# Patient Record
Sex: Female | Born: 1995 | Race: Black or African American | Hispanic: No | Marital: Married | Smoking: Never smoker
Health system: Southern US, Community
[De-identification: ages and names within clinical notes are randomized; demographics above are authoritative.]

## PROBLEM LIST (undated history)

## (undated) ENCOUNTER — Inpatient Hospital Stay: Payer: Self-pay

## (undated) DIAGNOSIS — G43909 Migraine, unspecified, not intractable, without status migrainosus: Secondary | ICD-10-CM

## (undated) DIAGNOSIS — M1990 Unspecified osteoarthritis, unspecified site: Secondary | ICD-10-CM

## (undated) HISTORY — DX: Migraine, unspecified, not intractable, without status migrainosus: G43.909

## (undated) HISTORY — PX: WISDOM TOOTH EXTRACTION: SHX21

## (undated) HISTORY — DX: Unspecified osteoarthritis, unspecified site: M19.90

---
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
## 2017-06-16 ENCOUNTER — Other Ambulatory Visit: Payer: Self-pay

## 2017-06-16 ENCOUNTER — Encounter: Payer: Self-pay | Admitting: Emergency Medicine

## 2017-06-16 ENCOUNTER — Emergency Department
Admission: EM | Admit: 2017-06-16 | Discharge: 2017-06-16 | Disposition: A | Discharge status: Home / Self Care | Payer: Medicaid Other | Attending: Emergency Medicine | Admitting: Emergency Medicine

## 2017-06-16 DIAGNOSIS — K2100 Gastro-esophageal reflux disease with esophagitis, without bleeding: Secondary | ICD-10-CM

## 2017-06-16 DIAGNOSIS — J029 Acute pharyngitis, unspecified: Secondary | ICD-10-CM | POA: Diagnosis present

## 2017-06-16 MED ORDER — GI COCKTAIL ~~LOC~~
30.0000 mL | Freq: Once | ORAL | Status: AC
Start: 2017-06-16 — End: 2017-06-16
  Administered 2017-06-16: 30 mL via ORAL
  Filled 2017-06-16: qty 30

## 2017-06-16 MED ORDER — OMEPRAZOLE 20 MG PO CPDR: 20.0000 mg | DELAYED_RELEASE_CAPSULE | Freq: Every day | ORAL | 0 refills | Status: DC

## 2017-06-16 NOTE — ED Notes (Signed)
Pt ambulatory upon discharge. This RN gave graham crackers and water, per patient request. Pt stated "I feel like I'm going to pass out. I haven't eaten all day." Verbalized understanding of discharge instructions, prescription and follow up. VSS. Skin warm and dry. A&O x4.

## 2017-06-16 NOTE — ED Triage Notes (Signed)
Pain with swollowing food or liquids x 2 days.

## 2017-06-16 NOTE — Discharge Instructions (Signed)
Follow-up with your regular doctor if your not better in 3-5 days, take the medication as prescribed, try to avoid spicy foods, if you are becoming worse please return to the emergency department

## 2017-06-16 NOTE — ED Provider Notes (Signed)
[facility] Emergency Department Provider Note  ____________________________________________   First MD Initiated Contact with Patient 06/16/17 1534     (approximate)  I have reviewed the triage vital signs and the nursing notes.   HISTORY  Chief Complaint Sore Throat    HPI Felicia Jones is a 21 y.o. female complains of epigastric pain, states it is worse after eating spicy chicken, chips, or any kind of hot spicy foods, she states that she feels like she could throw up but that something stuck and she cannot, she has not had any vomiting, no aspirin use, she does not smoke or drink, she states she does not feel weak, denies fever, chills, cough or congestion  History reviewed. No pertinent past medical history.  There are no active problems to display for this patient.   Past Surgical History:  Procedure Laterality Date  . WISDOM TOOTH EXTRACTION      Prior to Admission medications   Medication Sig Start Date End Date Taking? Authorizing Provider  omeprazole (PRILOSEC) 20 MG capsule Take 1 capsule (20 mg total) by mouth daily. 06/16/17   Faythe GheeFisher, Anaeli Cornwall W, PA-C    Allergies Patient has no known allergies.  No family history on file.  Social History Social History   Tobacco Use  . Smoking status: Not on file  Substance Use Topics  . Alcohol use: Not on file  . Drug use: Not on file    Review of Systems  Constitutional: No fever/chills Eyes: No visual changes. ENT: Positive sore throat only with swallowing chips. Respiratory: Denies cough ABD: Complains of epigastric pain Genitourinary: Negative for dysuria. Musculoskeletal: Negative for back pain. Skin: Negative for rash.    ____________________________________________   PHYSICAL EXAM:  VITAL SIGNS: ED Triage Vitals  Enc Vitals Group     BP 06/16/17 1520 (!) 130/59     Pulse Rate 06/16/17 1520 75     Resp 06/16/17 1520 18     Temp 06/16/17 1520 98.3 F (36.8 C)   Temp Source 06/16/17 1520 Oral     SpO2 06/16/17 1520 99 %     Weight 06/16/17 1520 170 lb (77.1 kg)     Height 06/16/17 1520 5' (1.524 m)     Head Circumference --      Peak Flow --      Pain Score 06/16/17 1519 3     Pain Loc --      Pain Edu? --      Excl. in GC? --     Constitutional: Alert and oriented. Well appearing and in no acute distress.  Patient does not appear ill, she is nontoxic Eyes: Conjunctivae are normal.  Head: Atraumatic. Nose: No congestion/rhinnorhea. Mouth/Throat: Mucous membranes are moist.  Throat is normal Cardiovascular: Normal rate, regular rhythm.  Heart sounds are normal Respiratory: Normal respiratory effort.  No retractions, lungs are clear to auscultation ABD: Soft, tender in the midepigastric and right upper quadrant, bowel sounds are normal in all 4 quadrants GU: deferred Musculoskeletal: FROM all extremities, warm and well perfused Neurologic:  Normal speech and language.  Skin:  Skin is warm, dry and intact. No rash noted. Psychiatric: Mood and affect are normal. Speech and behavior are normal.  ____________________________________________   LABS (all labs ordered are listed, but only abnormal results are displayed)  Labs Reviewed - No data to display ____________________________________________   ____________________________________________  RADIOLOGY    ____________________________________________   PROCEDURES  Procedure(s) performed: GI cocktail      ____________________________________________  INITIAL IMPRESSION / ASSESSMENT AND PLAN / ED COURSE  Pertinent labs & imaging results that were available during my care of the patient were reviewed by me and considered in my medical decision making (see chart for details).  Patient is a 21 year old female complaining of epigastric pain, she appears well and healthy, symptoms started 2 days ago after she had eaten some spicy chicken, she has not had vomiting or diarrhea,  she has not tried any over-the-counter medications for heartburn or reflux, GI cocktail was ordered   ----------------------------------------- 7:07 PM on 06/16/2017 -----------------------------------------  Patient states that she had relief with the GI cocktail, she states she feels much better, she was instructed to not eat spicy foods, she was given a prescription for omeprazole 20 mg daily, she is to return if she is getting worse or see her regular doctor, patient states she understands treatment plan and will comply, she is discharged in stable condition  ____________________________________________   FINAL CLINICAL IMPRESSION(S) / ED DIAGNOSES  Final diagnoses:  Gastroesophageal reflux disease with esophagitis      NEW MEDICATIONS STARTED DURING THIS VISIT:  This SmartLink is deprecated. Use AVSMEDLIST instead to display the medication list for a patient.   Note:  This document was prepared using Dragon voice recognition software and may include unintentional dictation errors.    Faythe GheeFisher, Zema Lizardo W, PA-C 06/16/17 Nicholos [id number]    Faythe GheeFisher, Janee Ureste W, PA-C 06/16/17 Windell [id number]    Jeanmarie PlantMcShane, James A, MD 06/16/17 [phone]

## 2017-07-03 NOTE — L&D Delivery Note (Signed)
Delivery Note At 1:40 PM a viable female was delivered via Vaginal, Spontaneous (Presentation: ROA).  APGAR: 9, 9; weight pending.   Placenta status: delivered spontaneously, intact.  Cord:  3VC with the following complications: none.  Cord pH: n/a  Anesthesia:  epidural Episiotomy: None Lacerations: 1st degree;Vaginal Suture Repair: 3.0 vicryl Quantified Blood Loss (mL):  702 mL  Mom to `.  Baby to Couplet care / Skin to Skin.  Called to see patient.  Mom pushed to deliver a viable female infant.  The head followed by shoulders, which delivered without difficulty, and the rest of the body.  No nuchal cord noted.  Baby to mom's chest.  Cord clamped and cut after > 1 min delay.  No cord blood obtained.  Placenta delivered spontaneously, intact, with a 3-vessel cord.  First degree vaginal laceration repaired with 3-0 Vicryl in one location that was not hemostatic. Hemostatic after suture.  All counts correct.  Hemostasis obtained with IV pitocin and fundal massage. QBL 702 mL.  Due to postpartum hemorrhage (or greater than expected blood loss), patient given misoprostol 800 mcg per rectum and metherine IM 0.2 mg with hemostasis obtained.  Thomasene MohairStephen Meili Kleckley, MD 05/16/2018, 2:10 PM

## 2017-07-04 ENCOUNTER — Encounter: Payer: Self-pay | Admitting: Emergency Medicine

## 2017-07-04 ENCOUNTER — Emergency Department
Admission: EM | Admit: 2017-07-04 | Discharge: 2017-07-04 | Disposition: A | Discharge status: Home / Self Care | Payer: Medicaid Other | Attending: Emergency Medicine | Admitting: Emergency Medicine

## 2017-07-04 DIAGNOSIS — J219 Acute bronchiolitis, unspecified: Secondary | ICD-10-CM | POA: Diagnosis not present

## 2017-07-04 DIAGNOSIS — R05 Cough: Secondary | ICD-10-CM | POA: Diagnosis present

## 2017-07-04 MED ORDER — BENZONATATE 200 MG PO CAPS: 200.0000 mg | ORAL_CAPSULE | Freq: Three times a day (TID) | ORAL | 0 refills | Status: DC | PRN

## 2017-07-04 MED ORDER — AZITHROMYCIN 250 MG PO TABS: ORAL_TABLET | ORAL | 0 refills | Status: DC

## 2017-07-04 NOTE — Discharge Instructions (Signed)
Follow-up with your regular doctor if you are not better in 3-5 days, use medication as prescribed, take Tylenol or ibuprofen as needed for fever, return to the emergency department if your worsening

## 2017-07-04 NOTE — ED Triage Notes (Signed)
Pt comes into the Ed via POV c/o fever and cough for a couple of days.  Patient denies any shortness of breath with the cough.  Patient in NAD at this time with even and unlabored respirations.  Highest fever at home was 101.4 and it has been treat with OTC theraflu.

## 2017-07-04 NOTE — ED Provider Notes (Signed)
[facility] Emergency Department Provider Note  ____________________________________________   None    (approximate)  I have reviewed the triage vital signs and the nursing notes.   HISTORY  Chief Complaint Fever and Cough    HPI Felicia Jones is a 22 y.o. female complains of cough and congestion with fever, states her fever at home was 101, symptoms for over 48 hours, she states that she has body aches father fever is up but after that she does not, she used over-the-counter TheraFlu which did help, she denies vomiting or diarrhea, she denies sore throat or runny nose, she did miss work today due to the fever  History reviewed. No pertinent past medical history.  There are no active problems to display for this patient.   Past Surgical History:  Procedure Laterality Date  . WISDOM TOOTH EXTRACTION      Prior to Admission medications   Medication Sig Start Date End Date Taking? Authorizing Provider  azithromycin (ZITHROMAX Z-PAK) 250 MG tablet 2 pills today then 1 pill a day for 4 days 07/04/17   Sherrie MustacheFisher, Roselyn BeringSusan W, PA-C  benzonatate (TESSALON) 200 MG capsule Take 1 capsule (200 mg total) by mouth 3 (three) times daily as needed for cough. 07/04/17   Clayton Jarmon, Roselyn BeringSusan W, PA-C  omeprazole (PRILOSEC) 20 MG capsule Take 1 capsule (20 mg total) by mouth daily. 06/16/17   Faythe GheeFisher, Jaysa Kise W, PA-C    Allergies Patient has no known allergies.  No family history on file.  Social History Social History   Tobacco Use  . Smoking status: Never Smoker  . Smokeless tobacco: Never Used  Substance Use Topics  . Alcohol use: No    Frequency: Never  . Drug use: No    Review of Systems  Constitutional: Positive fever/chills Eyes: No visual changes. ENT: No sore throat. Respiratory: Positive cough Genitourinary: Negative for dysuria. Musculoskeletal: Negative for back pain. Skin: Negative for rash.    ____________________________________________   PHYSICAL  EXAM:  VITAL SIGNS: ED Triage Vitals [07/04/17 1800]  Enc Vitals Group     BP 138/79     Pulse Rate (!) 106     Resp 15     Temp 98.9 F (37.2 C)     Temp Source Oral     SpO2 99 %     Weight 170 lb (77.1 kg)     Height 5' (1.524 m)     Head Circumference      Peak Flow      Pain Score 0     Pain Loc      Pain Edu?      Excl. in GC?     Constitutional: Alert and oriented. Well appearing and in no acute distress. Eyes: Conjunctivae are normal.  Head: Atraumatic. Nose: No congestion/rhinnorhea. Mouth/Throat: Mucous membranes are moist.  Throat is normal Cardiovascular: Normal rate, regular rhythm.  Heart sounds are normal Respiratory: Normal respiratory effort.  No retractions, lungs clear to auscultation, cough is hacking and dry GU: deferred Musculoskeletal: FROM all extremities, warm and well perfused Neurologic:  Normal speech and language.  Skin:  Skin is warm, dry and intact. No rash noted. Psychiatric: Mood and affect are normal. Speech and behavior are normal.  ____________________________________________   LABS (all labs ordered are listed, but only abnormal results are displayed)  Labs Reviewed - No data to display ____________________________________________   ____________________________________________  RADIOLOGY    ____________________________________________   PROCEDURES  Procedure(s) performed: No  ____________________________________________   INITIAL IMPRESSION / ASSESSMENT AND PLAN / ED COURSE  Pertinent labs & imaging results that were available during my care of the patient were reviewed by me and considered in my medical decision making (see chart for details).  Patient is a 22 year old female complaining of cough and congestion, states she had a fever while at home, symptoms been going on for close to or greater than 48 hours, she denies chest pain or shortness of breath, physical exam is benign except for the dry hacking  cough, discussed with patient whether to do a flu swab or not, she agrees that since it has been closer to 48 hours and Tamiflu so expensive to not do this flu swab, patient was diagnosed with acute bronchitis, prescription for Z-Pak, Tessalon Perles, she is to continue her TheraFlu as needed, Tylenol and ibuprofen for fever as needed, she is given a work note she works at [employer], she is not to work until she is not had a fever in 24-48 hours, patient states she understands will comply with our recommendations, she was discharged in stable condition     As part of my medical decision making, I reviewed the following data within the electronic MEDICAL RECORD NUMBER   ____________________________________________   FINAL CLINICAL IMPRESSION(S) / ED DIAGNOSES  Final diagnoses:  Acute bronchiolitis due to unspecified organism      NEW MEDICATIONS STARTED DURING THIS VISIT:  This SmartLink is deprecated. Use AVSMEDLIST instead to display the medication list for a patient.   Note:  This document was prepared using Dragon voice recognition software and may include unintentional dictation errors.    Faythe Ghee, PA-C 07/04/17 Lavetta Nielsen, MD 07/10/17 [phone]

## 2017-10-16 ENCOUNTER — Ambulatory Visit (INDEPENDENT_AMBULATORY_CARE_PROVIDER_SITE_OTHER): Payer: Medicaid Other | Admitting: Maternal Newborn

## 2017-10-16 ENCOUNTER — Encounter: Payer: Self-pay | Admitting: Maternal Newborn

## 2017-10-16 ENCOUNTER — Ambulatory Visit (INDEPENDENT_AMBULATORY_CARE_PROVIDER_SITE_OTHER): Payer: Medicaid Other

## 2017-10-16 VITALS — BP 110/70 | Wt 179.0 lb

## 2017-10-16 DIAGNOSIS — Z6834 Body mass index (BMI) 34.0-34.9, adult: Secondary | ICD-10-CM

## 2017-10-16 DIAGNOSIS — Z3400 Encounter for supervision of normal first pregnancy, unspecified trimester: Secondary | ICD-10-CM

## 2017-10-16 DIAGNOSIS — Z3490 Encounter for supervision of normal pregnancy, unspecified, unspecified trimester: Secondary | ICD-10-CM | POA: Insufficient documentation

## 2017-10-16 NOTE — Progress Notes (Signed)
10/16/2017   Chief Complaint: Amenorrhea, positive home pregnancy test, desires prenatal care.  Transfer of Care Patient: no  History of Present Illness: Felicia Jones is a 21 y.o. G1P0 [redacted]w[redacted]d based on Patient's last menstrual period on 08/28/2017 (approximate), with an Estimated Date of Delivery: 06/04/18, with the above CC.   Her periods were: regular periods every 28 days, lasting 3-4 days She was using Ortho-Evra, stopped right before she conceived.  She has Positive signs or symptoms of nausea/vomiting of pregnancy. She has Negative signs or symptoms of miscarriage or preterm labor She identifies Negative Zika risk factors for her and her partner On any different medications around the time she conceived/early pregnancy: No History of varicella: No   ROS: A 12-point review of systems was performed and negative, except as stated in the above HPI.  OBGYN History: As per HPI. OB History  Gravida Para Term Preterm AB Living  1            SAB TAB Ectopic Multiple Live Births               # Outcome Date GA Lbr Len/2nd Weight Sex Delivery Anes PTL Lv  1 Current             Any issues with any prior pregnancies: not applicable Any prior children are healthy, doing well, without any problems or issues: not applicable History of pap smears: N/A, under 95 years old History of STIs: No   Past Medical History: Past Medical History:  Diagnosis Date  . Arthritis    in back  . Migraines     Past Surgical History: Past Surgical History:  Procedure Laterality Date  . WISDOM TOOTH EXTRACTION      Family History:  Family History  Problem Relation Age of Onset  . Migraines Mother   . Diabetes Maternal Grandmother   . Hypertension Maternal Grandmother   . Diabetes Maternal Grandfather   . Hypertension Maternal Grandfather    She denies any female cancers, bleeding or blood clotting disorders.  She denies any history of intellectual disability, birth defects or genetic disorders in  her or the FOB's history.  Social History:  Social History   Socioeconomic History  . Marital status: Single    Spouse name: Not on file  . Number of children: Not on file  . Years of education: 51  . Highest education level: Not on file  Occupational History  . Occupation: Agricultural engineer    Comment: [employer]  Social Needs  . Financial resource strain: Not on file  . Food insecurity:    Worry: Not on file    Inability: Not on file  . Transportation needs:    Medical: Not on file    Non-medical: Not on file  Tobacco Use  . Smoking status: Never Smoker  . Smokeless tobacco: Never Used  Substance and Sexual Activity  . Alcohol use: No    Frequency: Never  . Drug use: No  . Sexual activity: Yes    Birth control/protection: None  Lifestyle  . Physical activity:    Days per week: Not on file    Minutes per session: Not on file  . Stress: Not on file  Relationships  . Social connections:    Talks on phone: Not on file    Gets together: Not on file    Attends religious service: Not on file    Active member of club or organization: Not on file    Attends meetings  of clubs or organizations: Not on file    Relationship status: Not on file  . Intimate partner violence:    Fear of current or ex partner: Not on file    Emotionally abused: Not on file    Physically abused: Not on file    Forced sexual activity: Not on file  Other Topics Concern  . Not on file  Social History Narrative  . Not on file   Any cats in the household: no  Allergy: No Known Allergies  Current Outpatient Medications:  Current Outpatient Medications:  .  Prenatal Vit-Fe Fumarate-FA (PRENATAL VITAMIN) 27-0.8 MG TABS, Take 1 tablet by mouth daily., Disp: , Rfl:    Physical Exam:   BP 110/70   Wt 179 lb (81.2 kg)   LMP 08/28/2017 (Approximate)   BMI 34.96 kg/m  Body mass index is 34.96 kg/m. Constitutional: Well nourished, well developed female in no acute distress.  Neck:   Supple, normal appearance, and no thyromegaly  Cardiovascular: S1, S2 normal, no murmur, rub or gallop, regular rate and rhythm Respiratory:  Clear to auscultation bilaterally. Normal respiratory effort Abdomen: positive bowel sounds and no masses, hernias; diffusely non tender to palpation, non distended Breasts: breasts appear normal, no suspicious masses, no skin or nipple changes or axillary nodes, risk and benefit of breast self-exam was discussed. Neuro/Psych:  Normal mood and affect.  Skin:  Warm and dry.  Lymphatic:  No inguinal lymphadenopathy.   Pelvic exam: is not limited by body habitus External genitalia, Bartholin's glands, Urethra, Skene's glands: within normal limits Vagina: within normal limits and with no blood in the vault  Cervix: normal appearing cervix without discharge or lesions, closed/long/high Uterus:  enlarged, c/w early pregnancy Adnexa:  no mass, fullness, tenderness  Assessment: Felicia Jones is a 22 y.o. G1P0 1146w0d based on Patient's last menstrual period on 08/28/2017 (approximate), with an Estimated Date of Delivery: 06/04/18, presenting for prenatal care.  Plan:  1) Avoid alcoholic beverages. 2) Patient encouraged not to smoke.  3) Discontinue the use of all non-medicinal drugs and chemicals.  4) Take prenatal vitamins daily.  5) Seatbelt use advised 6) Nutrition, food safety (fish, cheese advisories, and high nitrite foods) and exercise discussed. 7) [facility] discussed  8) Patient is asked about travel to areas at risk for the Zika virus, and counseled to avoid travel and exposure to mosquitoes or sexual partners who may have themselves been exposed to the virus. Testing is discussed, and will be ordered as appropriate.  9) [facility] advised 10) Genetic Screening, such as with 1st Trimester Screening, cell free fetal DNA, AFP testing, and Ultrasound, as well as with amniocentesis and CVS as appropriate, is  discussed with patient. She is undecided about genetic testing this pregnancy, considering First Trimester screen. 11) GTT for BMI > 30 and NOB labs, will return next week as she has eaten this morning. 12) Ultrasound today shows a singleton IUP with FHR of 136 and size=dates. 13) Bonjesta sample given, will call for Diclegis Rx if effective.  Problem list reviewed and updated.  Felicia Jones, CNM [facility], [facility] 10/16/2017  8:42 AM

## 2017-10-16 NOTE — Patient Instructions (Signed)
First Trimester of Pregnancy The first trimester of pregnancy is from week 1 until the end of week 13 (months 1 through 3). A week after a sperm fertilizes an egg, the egg will implant on the wall of the uterus. This embryo will begin to develop into a baby. Genes from you and your partner will form the baby. The female genes will determine whether the baby will be a boy or a girl. At 6-8 weeks, the eyes and face will be formed, and the heartbeat can be seen on ultrasound. At the end of 12 weeks, all the baby's organs will be formed. Now that you are pregnant, you will want to do everything you can to have a healthy baby. Two of the most important things are to get good prenatal care and to follow your health care provider's instructions. Prenatal care is all the medical care you receive before the baby's birth. This care will help prevent, find, and treat any problems during the pregnancy and childbirth. Body changes during your first trimester Your body goes through many changes during pregnancy. The changes vary from woman to woman.  You may gain or lose a couple of pounds at first.  You may feel sick to your stomach (nauseous) and you may throw up (vomit). If the vomiting is uncontrollable, call your health care provider.  You may tire easily.  You may develop headaches that can be relieved by medicines. All medicines should be approved by your health care provider.  You may urinate more often. Painful urination may mean you have a bladder infection.  You may develop heartburn as a result of your pregnancy.  You may develop constipation because certain hormones are causing the muscles that push stool through your intestines to slow down.  You may develop hemorrhoids or swollen veins (varicose veins).  Your breasts may begin to grow larger and become tender. Your nipples may stick out more, and the tissue that surrounds them (areola) may become darker.  Your gums may bleed and may be  sensitive to brushing and flossing.  Dark spots or blotches (chloasma, mask of pregnancy) may develop on your face. This will likely fade after the baby is born.  Your menstrual periods will stop.  You may have a loss of appetite.  You may develop cravings for certain kinds of food.  You may have changes in your emotions from day to day, such as being excited to be pregnant or being concerned that something may go wrong with the pregnancy and baby.  You may have more vivid and strange dreams.  You may have changes in your hair. These can include thickening of your hair, rapid growth, and changes in texture. Some women also have hair loss during or after pregnancy, or hair that feels dry or thin. Your hair will most likely return to normal after your baby is born.  What to expect at prenatal visits During a routine prenatal visit:  You will be weighed to make sure you and the baby are growing normally.  Your blood pressure will be taken.  Your abdomen will be measured to track your baby's growth.  The fetal heartbeat will be listened to between weeks 10 and 14 of your pregnancy.  Test results from any previous visits will be discussed.  Your health care provider may ask you:  How you are feeling.  If you are feeling the baby move.  If you have had any abnormal symptoms, such as leaking fluid, bleeding, severe headaches,   or abdominal cramping.  If you are using any tobacco products, including cigarettes, chewing tobacco, and electronic cigarettes.  If you have any questions.  Other tests that may be performed during your first trimester include:  Blood tests to find your blood type and to check for the presence of any previous infections. The tests will also be used to check for low iron levels (anemia) and protein on red blood cells (Rh antibodies). Depending on your risk factors, or if you previously had diabetes during pregnancy, you may have tests to check for high blood  sugar that affects pregnant women (gestational diabetes).  Urine tests to check for infections, diabetes, or protein in the urine.  An ultrasound to confirm the proper growth and development of the baby.  Fetal screens for spinal cord problems (spina bifida) and Down syndrome.  HIV (human immunodeficiency virus) testing. Routine prenatal testing includes screening for HIV, unless you choose not to have this test.  You may need other tests to make sure you and the baby are doing well.  Follow these instructions at home: Medicines  Follow your health care provider's instructions regarding medicine use. Specific medicines may be either safe or unsafe to take during pregnancy.  Take a prenatal vitamin that contains at least 600 micrograms (mcg) of folic acid.  If you develop constipation, try taking a stool softener if your health care provider approves. Eating and drinking  Eat a balanced diet that includes fresh fruits and vegetables, whole grains, good sources of protein such as meat, eggs, or tofu, and low-fat dairy. Your health care provider will help you determine the amount of weight gain that is right for you.  Avoid raw meat and uncooked cheese. These carry germs that can cause birth defects in the baby.  Eating four or five small meals rather than three large meals a day may help relieve nausea and vomiting. If you start to feel nauseous, eating a few soda crackers can be helpful. Drinking liquids between meals, instead of during meals, also seems to help ease nausea and vomiting.  Limit foods that are high in fat and processed sugars, such as fried and sweet foods.  To prevent constipation: ? Eat foods that are high in fiber, such as fresh fruits and vegetables, whole grains, and beans. ? Drink enough fluid to keep your urine clear or pale yellow. Activity  Exercise only as directed by your health care provider. Most women can continue their usual exercise routine during  pregnancy. Try to exercise for 30 minutes at least 5 days a week. Exercising will help you: ? Control your weight. ? Stay in shape. ? Be prepared for labor and delivery.  Experiencing pain or cramping in the lower abdomen or lower back is a good sign that you should stop exercising. Check with your health care provider before continuing with normal exercises.  Try to avoid standing for long periods of time. Move your legs often if you must stand in one place for a long time.  Avoid heavy lifting.  Wear low-heeled shoes and practice good posture.  You may continue to have sex unless your health care provider tells you not to. Relieving pain and discomfort  Wear a good support bra to relieve breast tenderness.  Take warm sitz baths to soothe any pain or discomfort caused by hemorrhoids. Use hemorrhoid cream if your health care provider approves.  Rest with your legs elevated if you have leg cramps or low back pain.  If you develop   varicose veins in your legs, wear support hose. Elevate your feet for 15 minutes, 3-4 times a day. Limit salt in your diet. Prenatal care  Schedule your prenatal visits by the twelfth week of pregnancy. They are usually scheduled monthly at first, then more often in the last 2 months before delivery.  Write down your questions. Take them to your prenatal visits.  Keep all your prenatal visits as told by your health care provider. This is important. Safety  Wear your seat belt at all times when driving.  Make a list of emergency phone numbers, including numbers for family, friends, the hospital, and police and fire departments. General instructions  Ask your health care provider for a referral to a local prenatal education class. Begin classes no later than the beginning of month 6 of your pregnancy.  Ask for help if you have counseling or nutritional needs during pregnancy. Your health care provider can offer advice or refer you to specialists for help  with various needs.  Do not use hot tubs, steam rooms, or saunas.  Do not douche or use tampons or scented sanitary pads.  Do not cross your legs for long periods of time.  Avoid cat litter boxes and soil used by cats. These carry germs that can cause birth defects in the baby and possibly loss of the fetus by miscarriage or stillbirth.  Avoid all smoking, herbs, alcohol, and medicines not prescribed by your health care provider. Chemicals in these products affect the formation and growth of the baby.  Do not use any products that contain nicotine or tobacco, such as cigarettes and e-cigarettes. If you need help quitting, ask your health care provider. You may receive counseling support and other resources to help you quit.  Schedule a dentist appointment. At home, brush your teeth with a soft toothbrush and be gentle when you floss. Contact a health care provider if:  You have dizziness.  You have mild pelvic cramps, pelvic pressure, or nagging pain in the abdominal area.  You have persistent nausea, vomiting, or diarrhea.  You have a bad smelling vaginal discharge.  You have pain when you urinate.  You notice increased swelling in your face, hands, legs, or ankles.  You are exposed to fifth disease or chickenpox.  You are exposed to German measles (rubella) and have never had it. Get help right away if:  You have a fever.  You are leaking fluid from your vagina.  You have spotting or bleeding from your vagina.  You have severe abdominal cramping or pain.  You have rapid weight gain or loss.  You vomit blood or material that looks like coffee grounds.  You develop a severe headache.  You have shortness of breath.  You have any kind of trauma, such as from a fall or a car accident. Summary  The first trimester of pregnancy is from week 1 until the end of week 13 (months 1 through 3).  Your body goes through many changes during pregnancy. The changes vary from  woman to woman.  You will have routine prenatal visits. During those visits, your health care provider will examine you, discuss any test results you may have, and talk with you about how you are feeling. This information is not intended to replace advice given to you by your health care provider. Make sure you discuss any questions you have with your health care provider. Document Released: 06/13/2001 Document Revised: 05/31/2016 Document Reviewed: 05/31/2016 Elsevier Interactive Patient Education  2018 Elsevier   Inc[employer]  

## 2017-10-16 NOTE — Progress Notes (Signed)
No [email or web address].rj 

## 2017-10-18 LAB — URINE DRUG PANEL 7
Amphetamines, Urine: NEGATIVE ng/mL
BARBITURATE QUANT UR: NEGATIVE ng/mL
Benzodiazepine Quant, Ur: NEGATIVE ng/mL
Cannabinoid Quant, Ur: NEGATIVE ng/mL
Cocaine (Metab.): NEGATIVE ng/mL
OPIATE QUANT UR: NEGATIVE ng/mL
PCP Quant, Ur: NEGATIVE ng/mL

## 2017-10-18 LAB — GC/CHLAMYDIA PROBE AMP
CHLAMYDIA, DNA PROBE: NEGATIVE
NEISSERIA GONORRHOEAE BY PCR: NEGATIVE

## 2017-10-18 LAB — URINE CULTURE

## 2017-10-19 ENCOUNTER — Encounter: Payer: Self-pay | Admitting: Maternal Newborn

## 2017-10-23 ENCOUNTER — Other Ambulatory Visit: Payer: Medicaid Other

## 2017-10-23 DIAGNOSIS — Z3400 Encounter for supervision of normal first pregnancy, unspecified trimester: Secondary | ICD-10-CM

## 2017-10-23 DIAGNOSIS — Z6834 Body mass index (BMI) 34.0-34.9, adult: Secondary | ICD-10-CM

## 2017-10-23 LAB — OB RESULTS CONSOLE VARICELLA ZOSTER ANTIBODY, IGG: VARICELLA IGG: IMMUNE

## 2017-10-23 LAB — OB RESULTS CONSOLE RUBELLA ANTIBODY, IGM: RUBELLA: IMMUNE

## 2017-10-25 LAB — RPR+RH+ABO+RUB AB+AB SCR+CB...
ANTIBODY SCREEN: NEGATIVE
HEMATOCRIT: 37.1 % (ref 34.0–46.6)
HEMOGLOBIN: 11.9 g/dL (ref 11.1–15.9)
HIV SCREEN 4TH GENERATION: NONREACTIVE
Hepatitis B Surface Ag: NEGATIVE
MCH: 26.6 pg (ref 26.6–33.0)
MCHC: 32.1 g/dL (ref 31.5–35.7)
MCV: 83 fL (ref 79–97)
Platelets: 315 10*3/uL (ref 150–379)
RBC: 4.48 x10E6/uL (ref 3.77–5.28)
RDW: 15.3 % (ref 12.3–15.4)
RH TYPE: POSITIVE
RPR Ser Ql: NONREACTIVE
Rubella Antibodies, IGG: 2.4 index (ref >0.99)
Varicella zoster IgG: 355 index (ref >165)
WBC: 9.3 10*3/uL (ref 3.4–10.8)

## 2017-10-25 LAB — HEMOGLOBINOPATHY EVALUATION
HEMOGLOBIN A2 QUANTITATION: 2.3 % (ref 1.8–3.2)
HGB A: 97.7 % (ref 96.4–98.8)
HGB C: 0 %
HGB S: 0 %
HGB VARIANT: 0 %
Hemoglobin F Quantitation: 0 % (ref 0.0–2.0)

## 2017-10-25 LAB — GLUCOSE, 1 HOUR GESTATIONAL: Gestational Diabetes Screen: 119 mg/dL (ref 65–139)

## 2017-10-26 ENCOUNTER — Other Ambulatory Visit: Payer: Self-pay | Admitting: Maternal Newborn

## 2017-10-26 DIAGNOSIS — Z3400 Encounter for supervision of normal first pregnancy, unspecified trimester: Secondary | ICD-10-CM

## 2017-11-13 ENCOUNTER — Ambulatory Visit (INDEPENDENT_AMBULATORY_CARE_PROVIDER_SITE_OTHER): Payer: Medicaid Other | Admitting: Obstetrics and Gynecology

## 2017-11-13 ENCOUNTER — Encounter: Payer: Self-pay | Admitting: Obstetrics and Gynecology

## 2017-11-13 VITALS — BP 100/60 | Wt 178.0 lb

## 2017-11-13 DIAGNOSIS — Z3A11 11 weeks gestation of pregnancy: Secondary | ICD-10-CM

## 2017-11-13 DIAGNOSIS — Z1379 Encounter for other screening for genetic and chromosomal anomalies: Secondary | ICD-10-CM

## 2017-11-13 DIAGNOSIS — O219 Vomiting of pregnancy, unspecified: Secondary | ICD-10-CM

## 2017-11-13 DIAGNOSIS — Z3400 Encounter for supervision of normal first pregnancy, unspecified trimester: Secondary | ICD-10-CM

## 2017-11-13 MED ORDER — PROVIDA OB 20-20-1.25 MG PO CAPS: 1.0000 | ORAL_CAPSULE | Freq: Every day | ORAL | 12 refills | Status: DC

## 2017-11-13 MED ORDER — DOXYLAMINE-PYRIDOXINE 10-10 MG PO TBEC: 2.0000 | DELAYED_RELEASE_TABLET | Freq: Every day | ORAL | 5 refills | Status: DC

## 2017-11-13 NOTE — Patient Instructions (Signed)
Second Trimester of Pregnancy The second trimester is from week 13 through week 28, month 4 through 6. This is often the time in pregnancy that you feel your best. Often times, morning sickness has lessened or quit. You may have more energy, and you may get hungry more often. Your unborn baby (fetus) is growing rapidly. At the end of the sixth month, he or she is about 9 inches long and weighs about 1 pounds. You will likely feel the baby move (quickening) between 18 and 20 weeks of pregnancy. Follow these instructions at home:  Avoid all smoking, herbs, and alcohol. Avoid drugs not approved by your doctor.  Do not use any tobacco products, including cigarettes, chewing tobacco, and electronic cigarettes. If you need help quitting, ask your doctor. You may get counseling or other support to help you quit.  Only take medicine as told by your doctor. Some medicines are safe and some are not during pregnancy.  Exercise only as told by your doctor. Stop exercising if you start having cramps.  Eat regular, healthy meals.  Wear a good support bra if your breasts are tender.  Do not use hot tubs, steam rooms, or saunas.  Wear your seat belt when driving.  Avoid raw meat, uncooked cheese, and liter boxes and soil used by cats.  Take your prenatal vitamins.  Take 1500-2000 milligrams of calcium daily starting at the 20th week of pregnancy until you deliver your baby.  Try taking medicine that helps you poop (stool softener) as needed, and if your doctor approves. Eat more fiber by eating fresh fruit, vegetables, and whole grains. Drink enough fluids to keep your pee (urine) clear or pale yellow.  Take warm water baths (sitz baths) to soothe pain or discomfort caused by hemorrhoids. Use hemorrhoid cream if your doctor approves.  If you have puffy, bulging veins (varicose veins), wear support hose. Raise (elevate) your feet for 15 minutes, 3-4 times a day. Limit salt in your diet.  Avoid heavy  lifting, wear low heals, and sit up straight.  Rest with your legs raised if you have leg cramps or low back pain.  Visit your dentist if you have not gone during your pregnancy. Use a soft toothbrush to brush your teeth. Be gentle when you floss.  You can have sex (intercourse) unless your doctor tells you not to.  Go to your doctor visits. Get help if:  You feel dizzy.  You have mild cramps or pressure in your lower belly (abdomen).  You have a nagging pain in your belly area.  You continue to feel sick to your stomach (nauseous), throw up (vomit), or have watery poop (diarrhea).  You have bad smelling fluid coming from your vagina.  You have pain with peeing (urination). Get help right away if:  You have a fever.  You are leaking fluid from your vagina.  You have spotting or bleeding from your vagina.  You have severe belly cramping or pain.  You lose or gain weight rapidly.  You have trouble catching your breath and have chest pain.  You notice sudden or extreme puffiness (swelling) of your face, hands, ankles, feet, or legs.  You have not felt the baby move in over an hour.  You have severe headaches that do not go away with medicine.  You have vision changes. This information is not intended to replace advice given to you by your health care provider. Make sure you discuss any questions you have with your health care   provider. Document Released: 09/13/2009 Document Revised: 11/25/2015 Document Reviewed: 08/20/2012 Elsevier Interactive Patient Education  2017 Elsevier Inc.  

## 2017-11-13 NOTE — Progress Notes (Signed)
    Routine Prenatal Care Visit  Subjective  Felicia Jones is a 22 y.o. G1P0 at [redacted]w[redacted]d being seen today for ongoing prenatal care.  She is currently monitored for the following issues for this low-risk pregnancy and has Bilateral low back pain without sciatica and Supervision of normal first pregnancy, antepartum on their problem list.  ----------------------------------------------------------------------------------- Patient reports no complaints.    . Vag. Bleeding: None.   . Denies leaking of fluid.  ----------------------------------------------------------------------------------- The following portions of the patient's history were reviewed and updated as appropriate: allergies, current medications, past family history, past medical history, past social history, past surgical history and problem list. Problem list updated.   Objective  Blood pressure 100/60, weight 178 lb (80.7 kg), last menstrual period 08/28/2017. Pregravid weight 175 lb (79.4 kg) Total Weight Gain 3 lb (1.361 kg) Urinalysis: Urine Protein: Negative Urine Glucose: Negative  Fetal Status: Fetal Heart Rate (bpm): 160         General:  Alert, oriented and cooperative. Patient is in no acute distress.  Skin: Skin is warm and dry. No rash noted.   Cardiovascular: Normal heart rate noted  Respiratory: Normal respiratory effort, no problems with respiration noted  Abdomen: Soft, gravid, appropriate for gestational age. Pain/Pressure: Absent     Pelvic:  Cervical exam deferred        Extremities: Normal range of motion.     ental Status: Normal mood and affect. Normal behavior. Normal judgment and thought content.     Assessment   22 y.o. G1P0 at [redacted]w[redacted]d by  06/04/2018, by Last Menstrual Period presenting for work-in prenatal visit  Plan   FIRST Problems (from 10/16/17 to present)    Problem Noted Resolved   Supervision of normal first pregnancy, antepartum 10/16/2017 by Oswaldo Conroy, CNM No   Overview  Addendum 10/26/2017  1:21 PM by Oswaldo Conroy, CNM    [facility]inic [facility] Prenatal Labs  Dating L=6 Blood type: B/Positive/-- (04/23 1023)   Genetic Screen 1 Screen:    AFP:     Quad:     NIPS: Antibody:Negative (04/23 1023)  Anatomic [place]  Rubella: 2.40 (04/23 1023) Varicella: Immune  GTT Early: 119              Third trimester:  RPR: Non Reactive (04/23 1023)   Rhogam  HBsAg: Negative (04/23 1023)   TDaP vaccine                       Flu Shot: HIV: Non Reactive (04/23 1023)   Baby Food                                GBS:   Contraception  Pap: N/A, less than 54 years old  CBB     CS/VBAC    Support Person                  Gestational age appropriate obstetric precautions including but not limited to vaginal bleeding, contractions, leaking of fluid and fetal movement were reviewed in detail with the patient.    Prescription sent for [facility] Prenatal vitamins Prescription sent for Diclegis Materniti 21 testing today, patient desires an envelope with the gender.  Return in about 5 weeks (around 12/18/2017) for ROB.  Adelene Idler MD [facility], [facility] 11/13/17 9:45 AM

## 2017-11-18 LAB — MATERNIT 21 PLUS CORE, BLOOD
Chromosome 13: NEGATIVE
Chromosome 18: NEGATIVE
Chromosome 21: NEGATIVE
Y Chromosome: DETECTED

## 2017-11-19 ENCOUNTER — Telehealth: Payer: Self-pay | Admitting: Obstetrics and Gynecology

## 2017-11-19 NOTE — Telephone Encounter (Signed)
Patient is calling due to missed call in regards to results. Please advise

## 2017-11-19 NOTE — Telephone Encounter (Signed)
I have not seen the patient looks like she was seen by [facility] and [employer]

## 2017-11-19 NOTE — Telephone Encounter (Signed)
Please call and let patient know she can pick up envelope with gender as requested. Left message that genetic screening was normal for the pregnancy.  Thank you, Dr. Jerene Pitch

## 2017-11-19 NOTE — Telephone Encounter (Signed)
Forward to Dr. Jerene Pitch to advise. Sending error to AMS>

## 2017-11-19 NOTE — Telephone Encounter (Signed)
Patient is requesting a copy to pick up. Please advise

## 2017-11-19 NOTE — Progress Notes (Signed)
Please print gender result and put in envelope for the patient. Also please call her again so she can pick up the result. I called and left a message that it was ready. Thank you, Dr. Jerene Pitch

## 2017-12-03 ENCOUNTER — Encounter: Payer: Self-pay | Admitting: Emergency Medicine

## 2017-12-03 ENCOUNTER — Other Ambulatory Visit: Payer: Self-pay

## 2017-12-03 ENCOUNTER — Emergency Department
Admission: EM | Admit: 2017-12-03 | Discharge: 2017-12-03 | Disposition: A | Discharge status: Home / Self Care | Payer: Medicaid Other | Attending: Emergency Medicine | Admitting: Emergency Medicine

## 2017-12-03 DIAGNOSIS — O9989 Other specified diseases and conditions complicating pregnancy, childbirth and the puerperium: Secondary | ICD-10-CM | POA: Insufficient documentation

## 2017-12-03 DIAGNOSIS — Z3A13 13 weeks gestation of pregnancy: Secondary | ICD-10-CM | POA: Diagnosis not present

## 2017-12-03 DIAGNOSIS — R51 Headache: Secondary | ICD-10-CM | POA: Diagnosis not present

## 2017-12-03 DIAGNOSIS — G8929 Other chronic pain: Secondary | ICD-10-CM

## 2017-12-03 DIAGNOSIS — M545 Low back pain, unspecified: Secondary | ICD-10-CM

## 2017-12-03 DIAGNOSIS — Z79899 Other long term (current) drug therapy: Secondary | ICD-10-CM | POA: Diagnosis not present

## 2017-12-03 LAB — URINALYSIS, ROUTINE W REFLEX MICROSCOPIC
Bilirubin Urine: NEGATIVE
GLUCOSE, UA: NEGATIVE mg/dL
Hgb urine dipstick: NEGATIVE
KETONES UR: NEGATIVE mg/dL
LEUKOCYTES UA: NEGATIVE
NITRITE: NEGATIVE
PROTEIN: NEGATIVE mg/dL
Specific Gravity, Urine: 1.015 (ref 1.005–1.030)
pH: 5 (ref 5.0–8.0)

## 2017-12-03 NOTE — ED Triage Notes (Addendum)
Patient reports having headache since Sunday and back pain. Denies urinary symptoms.  Patient reports being [redacted] weeks pregnant.  Denies vaginal bleeding.

## 2017-12-03 NOTE — ED Notes (Signed)
ED Provider at bedside. 

## 2017-12-03 NOTE — Discharge Instructions (Addendum)
You can try the over-the-counter Motrin 3 pills for 600 mg total 3 times a day with food for 3 or 4 days. I would not use it longer than that. I would not use it later in pregnancy - when he gets to 20 - 24 weeks thereabouts. Can also use Tylenol as you have been. Please follow-up with your OB/GYN doctor return for any further problems.

## 2017-12-03 NOTE — ED Provider Notes (Signed)
[facility] Emergency Department Provider Note   ____________________________________________   First MD Initiated Contact with Patient 12/03/17 0915     (approximate)  I have reviewed the triage vital signs and the nursing notes.   HISTORY  Chief Complaint Back Pain    HPI Felicia Jones is a 22 y.o. female Patient with a history of back pain which had improved but has now returned she's been taking Tylenol at home but it still achy in her low back and is not radiating down her legs. She's also developed a headache the same as her previous migraines. Atenolol help with that either. She was unable to take her  migraine medicine due to her pregnancy. I discussed with her using Motrin during the early parts for pregnancy.she seems to be very happy with that   Past Medical History:  Diagnosis Date  . Arthritis    in back  . Migraines     Patient Active Problem List   Diagnosis Date Noted  . Supervision of normal first pregnancy, antepartum 10/16/2017  . Bilateral low back pain without sciatica 01/28/2016    Past Surgical History:  Procedure Laterality Date  . WISDOM TOOTH EXTRACTION      Prior to Admission medications   Medication Sig Start Date End Date Taking? Authorizing Provider  Doxylamine-Pyridoxine (DICLEGIS) 10-10 MG TBEC Take 2 tablets by mouth at bedtime. If symptoms persist, add one tablet in the morning and one in the afternoon 11/13/17   Schuman, Christanna R, MD  Prenat w/o A Vit-FeFum-FePo-FA (PROVIDA OB) 20-20-1.25 MG CAPS Take 1 tablet by mouth daily. 11/13/17 12/13/17  Schuman, Jaquelyn Bitter, MD  Prenatal Vit-Fe Fumarate-FA (PRENATAL VITAMIN) 27-0.8 MG TABS Take 1 tablet by mouth daily. 10/11/17   [provider]    Allergies Patient has no known allergies.  Family History  Problem Relation Age of Onset  . Migraines Mother   . Diabetes Maternal Grandmother   . Hypertension Maternal Grandmother   . Diabetes Maternal  Grandfather   . Hypertension Maternal Grandfather     Social History Social History   Tobacco Use  . Smoking status: Never Smoker  . Smokeless tobacco: Never Used  Substance Use Topics  . Alcohol use: No    Frequency: Never  . Drug use: No    Review of Systems  Constitutional: No fever/chills Eyes: No visual changes. ENT: No sore throat. Cardiovascular: Denies chest pain. Respiratory: Denies shortness of breath. Gastrointestinal: No abdominal pain.  No nausea, no vomiting.  No diarrhea.  No constipation. Genitourinary: Negative for dysuria. Musculoskeletal: Negative for back pain. Skin: Negative for rash. Neurological: Negative for headaches, focal weakness  ____________________________________________   PHYSICAL EXAM:  VITAL SIGNS: ED Triage Vitals  Enc Vitals Group     BP 12/03/17 0652 106/60     Pulse Rate 12/03/17 0652 85     Resp 12/03/17 0652 18     Temp 12/03/17 0652 98.3 F (36.8 C)     Temp Source 12/03/17 0652 Oral     SpO2 12/03/17 0652 99 %     Weight --      Height 12/03/17 0650 5' (1.524 m)     Head Circumference --      Peak Flow --      Pain Score 12/03/17 0650 6     Pain Loc --      Pain Edu? --      Excl. in GC? --     Constitutional: Alert and oriented. Well  appearing and in no acute distress. Eyes: Conjunctivae are normal. PER. EOMI. Head: Atraumatic. Nose: No congestion/rhinnorhea. Mouth/Throat: Mucous membranes are moist.  Oropharynx non-erythematous. Neck: No stridor.  Cardiovascular: Normal rate, regular rhythm. Grossly normal heart sounds.  Good peripheral circulation. Respiratory: Normal respiratory effort.  No retractions. Lungs CTAB. Gastrointestinal: Soft and nontender. No distention. No abdominal bruits. No CVA tenderness. Musculoskeletal: No lower extremity tenderness nor edema.  patient does have some pain on palpation of the low back. Neurologic:  Normal speech and language. No gross focal neurologic deficits are  appreciated.cranial nerves II through XII are intact motor strength is 5 over 5 throughout t straight leg raise is negative bilaterally Skin:  Skin is warm, dry and intact. No rash noted. Psychiatric: Mood and affect are normal. Speech and behavior are normal.  ____________________________________________   LABS (all labs ordered are listed, but only abnormal results are displayed)  Labs Reviewed  URINALYSIS, ROUTINE W REFLEX MICROSCOPIC - Abnormal; Notable for the following components:      Result Value   Color, Urine YELLOW (*)    APPearance CLEAR (*)    All other components within normal limits  POC URINE PREG, ED   ____________________________________________  EKG   ____________________________________________  RADIOLOGY  ED MD interpretation:    Official radiology report(s): No results found.  ____________________________________________   PROCEDURES  Procedure(s) performed:  Procedures  Critical Care performed:   ____________________________________________   INITIAL IMPRESSION / ASSESSMENT AND PLAN / ED COURSE  patient looks well symptoms are similar to what she had previous to her pregnancy. I will let her go and try add some Motrin to her regime. She will return if she is worse.         ____________________________________________   FINAL CLINICAL IMPRESSION(S) / ED DIAGNOSES  Final diagnoses:  Chronic low back pain without sciatica, unspecified back pain laterality     ED Discharge Orders    None       Note:  This document was prepared using Dragon voice recognition software and may include unintentional dictation errors.    Arnaldo NatalMalinda, Nolen Lindamood F, MD 12/03/17 [phone]

## 2017-12-18 ENCOUNTER — Ambulatory Visit (INDEPENDENT_AMBULATORY_CARE_PROVIDER_SITE_OTHER): Payer: Medicaid Other | Admitting: Obstetrics and Gynecology

## 2017-12-18 VITALS — Wt 176.0 lb

## 2017-12-18 DIAGNOSIS — Z3A16 16 weeks gestation of pregnancy: Secondary | ICD-10-CM

## 2017-12-18 DIAGNOSIS — Z3402 Encounter for supervision of normal first pregnancy, second trimester: Secondary | ICD-10-CM

## 2017-12-18 DIAGNOSIS — Z363 Encounter for antenatal screening for malformations: Secondary | ICD-10-CM

## 2017-12-18 DIAGNOSIS — Z31438 Encounter for other genetic testing of female for procreative management: Secondary | ICD-10-CM

## 2017-12-18 DIAGNOSIS — Z3400 Encounter for supervision of normal first pregnancy, unspecified trimester: Secondary | ICD-10-CM

## 2017-12-18 NOTE — Progress Notes (Signed)
ROB Middle back pain

## 2017-12-18 NOTE — Progress Notes (Signed)
    Routine Prenatal Care Visit  Subjective  Felicia Jones is a 22 y.o. G1P0 at 7829w0d being seen today for ongoing prenatal care.  She is currently monitored for the following issues for this low-risk pregnancy and has Bilateral low back pain without sciatica and Supervision of normal first pregnancy, antepartum on their problem list.  ----------------------------------------------------------------------------------- Patient reports no complaints.   Contractions: Not present. Vag. Bleeding: None.  Movement: Absent. Denies leaking of fluid.  ----------------------------------------------------------------------------------- The following portions of the patient's history were reviewed and updated as appropriate: allergies, current medications, past family history, past medical history, past social history, past surgical history and problem list. Problem list updated.   Objective  Weight 176 lb (79.8 kg), last menstrual period 08/28/2017. Pregravid weight 175 lb (79.4 kg) Total Weight Gain 1 lb (0.454 kg) Urinalysis: Urine Protein: Negative Urine Glucose: Negative  Fetal Status: Fetal Heart Rate (bpm): 150   Movement: Absent     General:  Alert, oriented and cooperative. Patient is in no acute distress.  Skin: Skin is warm and dry. No rash noted.   Cardiovascular: Normal heart rate noted  Respiratory: Normal respiratory effort, no problems with respiration noted  Abdomen: Soft, gravid, appropriate for gestational age. Pain/Pressure: Absent     Pelvic:  Cervical exam deferred        Extremities: Normal range of motion.     ental Status: Normal mood and affect. Normal behavior. Normal judgment and thought content.     Assessment   22 y.o. G1P0 at 6029w0d by  06/04/2018, by Last Menstrual Period presenting for routine prenatal visit  Plan   FIRST Problems (from 10/16/17 to present)    Problem Noted Resolved   Supervision of normal first pregnancy, antepartum 10/16/2017 by Oswaldo ConroySchmid,  Jacelyn Y, CNM No   Overview Addendum 12/18/2017  9:23 AM by Vena AustriaStaebler, Camilah Spillman, MD    Clinic [facility] Prenatal Labs  Dating L=6 Blood type: B/Positive/-- (04/23 1023)   Genetic Screen  AFP:  NIPS: Normal Female Antibody:Negative (04/23 1023)  Anatomic US  Rubella: 2.40 (04/23 1023) Varicella: Immune  GTT Early: 119              Third trimester:  RPR: Non Reactive (04/23 1023)   Rhogam  HBsAg: Negative (04/23 1023)   TDaP vaccine                       Flu Shot: HIV: Non Reactive (04/23 1023)   Baby Food                                GBS:   Contraception  Pap: N/A, less than 10658 years old  CBB     CS/VBAC    Support Person                  Gestational age appropriate obstetric precautions including but not limited to vaginal bleeding, contractions, leaking of fluid and fetal movement were reviewed in detail with the patient.   - Declines AFP - Accepts Inheritest - anatomy scan next visit  Return in about 1 month (around 01/15/2018) for ROB and anatomy scan.  Vena AustriaAndreas Nyilah Kight, MD, Merlinda FrederickFACOG [facility], [facility] 12/18/2017, 9:39 AM

## 2017-12-27 LAB — INHERITEST CORE(CF97,SMA,FRAX)

## 2017-12-28 ENCOUNTER — Encounter (INDEPENDENT_AMBULATORY_CARE_PROVIDER_SITE_OTHER): Payer: Self-pay

## 2018-01-15 ENCOUNTER — Ambulatory Visit (INDEPENDENT_AMBULATORY_CARE_PROVIDER_SITE_OTHER): Payer: Medicaid Other | Admitting: Obstetrics and Gynecology

## 2018-01-15 ENCOUNTER — Ambulatory Visit (INDEPENDENT_AMBULATORY_CARE_PROVIDER_SITE_OTHER): Payer: Medicaid Other

## 2018-01-15 VITALS — BP 110/60 | Wt 180.0 lb

## 2018-01-15 DIAGNOSIS — IMO0002 Reserved for concepts with insufficient information to code with codable children: Secondary | ICD-10-CM

## 2018-01-15 DIAGNOSIS — Z3400 Encounter for supervision of normal first pregnancy, unspecified trimester: Secondary | ICD-10-CM

## 2018-01-15 DIAGNOSIS — Z363 Encounter for antenatal screening for malformations: Secondary | ICD-10-CM | POA: Diagnosis not present

## 2018-01-15 DIAGNOSIS — Z3402 Encounter for supervision of normal first pregnancy, second trimester: Secondary | ICD-10-CM

## 2018-01-15 DIAGNOSIS — Z0489 Encounter for examination and observation for other specified reasons: Secondary | ICD-10-CM

## 2018-01-15 DIAGNOSIS — Z3A20 20 weeks gestation of pregnancy: Secondary | ICD-10-CM

## 2018-01-15 NOTE — Progress Notes (Signed)
ROB  Anatomy scan/ It is a BOY!! 

## 2018-01-15 NOTE — Progress Notes (Signed)
Routine Prenatal Care Visit  Subjective  Felicia Jones is a 22 y.o. G1P0 at 4831w0d being seen today for ongoing prenatal care.  She is currently monitored for the following issues for this low-risk pregnancy and has Bilateral low back pain without sciatica and Supervision of normal first pregnancy, antepartum on their problem list.  ----------------------------------------------------------------------------------- Patient reports no complaints.   Contractions: Not present. Vag. Bleeding: None.  Movement: Present. Denies leaking of fluid.  ----------------------------------------------------------------------------------- The following portions of the patient's history were reviewed and updated as appropriate: allergies, current medications, past family history, past medical history, past social history, past surgical history and problem list. Problem list updated.   Objective  Blood pressure 110/60, weight 180 lb (81.6 kg), last menstrual period 08/28/2017. Pregravid weight 175 lb (79.4 kg) Total Weight Gain 5 lb (2.268 kg)  Body mass index is 35.15 kg/m.  Urinalysis: Urine Protein: Negative Urine Glucose: Negative  Fetal Status: Fetal Heart Rate (bpm): 150   Movement: Present     General:  Alert, oriented and cooperative. Patient is in no acute distress.  Skin: Skin is warm and dry. No rash noted.   Cardiovascular: Normal heart rate noted  Respiratory: Normal respiratory effort, no problems with respiration noted  Abdomen: Soft, gravid, appropriate for gestational age. Pain/Pressure: Absent     Pelvic:  Cervical exam deferred        Extremities: Normal range of motion.     ental Status: Normal mood and affect. Normal behavior. Normal judgment and thought content.   [place] Ob Comp + 14 Wk  Result Date: 01/15/2018 Patient Name: Felicia Jones DOB: 03/24/1996 MRN: [id number] ULTRASOUND REPORT Location: [facility] Date of Service: 01/15/2018 Indications:Anatomy Ultrasound Findings:  Mason JimSingleton intrauterine pregnancy is visualized with FHR at 158 BPM. Biometrics give an (U/S) Gestational age of 915w4d and an (U/S) EDD of 05/31/2018; this correlates with the clinically established Estimated Date of Delivery: 06/04/18 Fetal presentation is Cephalic. EFW: 388 grams (14 oz). Placenta: Anterior, Grade 0. AFI: subjectively normal. Anatomic survey is incomplete for cardiac views, face, diaphragm and profile due to fetal position and normal; Gender - female.  --> TV cervical length is performed due to fluid is seen in transabdominal exam and appears normal. Right Ovary is normal in appearance. Left Ovary is normal appearance. Survey of the adnexa demonstrates no adnexal masses. There is no free peritoneal fluid in the cul de sac. Impression: 1. 7131w0d Viable Singleton Intrauterine pregnancy by U/S. 2. (U/S) EDD is consistent with Clinically established Estimated Date of Delivery: 06/04/18 . 3. Normal Anatomy Scan Recommendations: 1.Clinical correlation with the patient's History and Physical Exam. 2. Follow up anatomy Mital bahen P Patel  There is a singleton gestation with subjectively normal amniotic fluid volume. The fetal biometry correlates with established dating. Detailed evaluation of the fetal anatomy was performed.The fetal anatomical survey appears within normal limits within the resolution of ultrasound as described above, incomplete cardiac, face, and diaphragm views.  It must be noted that a normal ultrasound is unable to rule out fetal aneuploidy.  Vena AustriaAndreas Larrie Fraizer, MD, Evern CoreFACOG [facility], [facility] 01/15/2018, 10:01 AM     Assessment   22 y.o. G1P0 at 4331w0d by  06/04/2018, by Last Menstrual Period presenting for routine prenatal visit  Plan   FIRST Problems (from 10/16/17 to present)    Problem Noted Resolved   Supervision of normal first pregnancy, antepartum 10/16/2017 by Oswaldo ConroySchmid, Jacelyn Y, CNM No   Overview Addendum 01/15/2018 10:02 AM by Bonney AidStaebler,  Carmel Sacramento, MD     Clinic [facility] Prenatal Labs  Dating L=6 Blood type: B/Positive/-- (04/23 1023)   Genetic Screen Inheritest: negative NIPS: Normal Female Antibody:Negative (04/23 1023)  Anatomic [place] Female, [ ]  incomplete, complete 24 weeks Rubella: 2.40 (04/23 1023) Varicella: Immune  GTT Early: 119              Third trimester:  RPR: Non Reactive (04/23 1023)   Rhogam  HBsAg: Negative (04/23 1023)   TDaP vaccine                       Flu Shot: HIV: Non Reactive (04/23 1023)   Baby Food                                GBS:   Contraception  Pap: N/A, less than 6 years old  CBB     CS/VBAC    Support Person                  Gestational age appropriate obstetric precautions including but not limited to vaginal bleeding, contractions, leaking of fluid and fetal movement were reviewed in detail with the patient.    Return in about 1 month (around 02/12/2018) for ROB and follow up anatomy scan.  Vena Austria, MD, Evern Core [facility], [facility] 01/15/2018, 10:14 AM

## 2018-02-12 ENCOUNTER — Encounter: Payer: Self-pay | Admitting: Maternal Newborn

## 2018-02-12 ENCOUNTER — Ambulatory Visit (INDEPENDENT_AMBULATORY_CARE_PROVIDER_SITE_OTHER): Payer: Medicaid Other | Admitting: Maternal Newborn

## 2018-02-12 ENCOUNTER — Ambulatory Visit (INDEPENDENT_AMBULATORY_CARE_PROVIDER_SITE_OTHER): Payer: Medicaid Other

## 2018-02-12 VITALS — BP 120/60 | Wt 185.0 lb

## 2018-02-12 DIAGNOSIS — Z3A24 24 weeks gestation of pregnancy: Secondary | ICD-10-CM

## 2018-02-12 DIAGNOSIS — Z3400 Encounter for supervision of normal first pregnancy, unspecified trimester: Secondary | ICD-10-CM

## 2018-02-12 DIAGNOSIS — Z0489 Encounter for examination and observation for other specified reasons: Secondary | ICD-10-CM

## 2018-02-12 DIAGNOSIS — IMO0002 Reserved for concepts with insufficient information to code with codable children: Secondary | ICD-10-CM

## 2018-02-12 DIAGNOSIS — Z3402 Encounter for supervision of normal first pregnancy, second trimester: Secondary | ICD-10-CM | POA: Diagnosis not present

## 2018-02-12 DIAGNOSIS — Z3401 Encounter for supervision of normal first pregnancy, first trimester: Secondary | ICD-10-CM

## 2018-02-12 LAB — POCT URINALYSIS DIPSTICK OB
GLUCOSE, UA: NEGATIVE — AB
POC,PROTEIN,UA: NEGATIVE

## 2018-02-12 NOTE — Progress Notes (Signed)
    Routine Prenatal Care Visit  Subjective  Felicia Jones is a 22 y.o. G1P0 at 4090w0d being seen today for ongoing prenatal care.  She is currently monitored for the following issues for this low-risk pregnancy and has Bilateral low back pain without sciatica and Supervision of normal first pregnancy, antepartum on their problem list.  ----------------------------------------------------------------------------------- Patient reports no complaints.   Contractions: Not present. Vag. Bleeding: None.  Movement: Present. No leaking of fluid.  ----------------------------------------------------------------------------------- The following portions of the patient's history were reviewed and updated as appropriate: allergies, current medications, past family history, past medical history, past social history, past surgical history and problem list. Problem list updated.  Objective  Blood pressure 120/60, weight 185 lb (83.9 kg), last menstrual period 08/28/2017. Pregravid weight 175 lb (79.4 kg) Total Weight Gain 10 lb (4.536 kg) Body mass index is 36.13 kg/m. Urinalysis: Protein Negative, Glucose Negative Fetal Status: Fetal Heart Rate (bpm): 160 Fundal Height: 24 cm Movement: Present     General:  Alert, oriented and cooperative. Patient is in no acute distress.  Skin: Skin is warm and dry. No rash noted.   Cardiovascular: Normal heart rate noted  Respiratory: Normal respiratory effort, no problems with respiration noted  Abdomen: Soft, gravid, appropriate for gestational age. Pain/Pressure: Absent     Pelvic:  Cervical exam deferred        Extremities: Normal range of motion.  Edema: None  Mental Status: Normal mood and affect. Normal behavior. Normal judgment and thought content.    Assessment   22 y.o. G1P0 at 190w0d, EDD 06/04/2018 by Last Menstrual Period presenting for routine prenatal visit.  Plan   FIRST Problems (from 10/16/17 to present)    Problem Noted Resolved   Supervision  of normal first pregnancy, antepartum 10/16/2017 by Oswaldo ConroySchmid, Bailei Buist Y, CNM No   Overview Addendum 01/15/2018 10:15 AM by Vena AustriaStaebler, Andreas, MD    Clinic [facility] Prenatal Labs  Dating L=6 Blood type: B/Positive/-- (04/23 1023)   Genetic Screen Inheritest: negative NIPS: Normal Female Antibody:Negative (04/23 1023)  Anatomic US Female, [ ]  incomplete, complete 24 weeks Rubella: 2.40 (04/23 1023) Varicella: Immune  GTT Early: 119              Third trimester:  RPR: Non Reactive (04/23 1023)   Rhogam N/A HBsAg: Negative (04/23 1023)   TDaP vaccine                       Flu Shot: HIV: Non Reactive (04/23 1023)   Baby Food                                GBS:   Contraception  Pap: N/A, less than 20 years old  CBB     Support Person               Anatomy scan complete and normal today.  Discussed labor and delivery, breastfeeding, doula program. Science writerBrochure given for [employer] and childbirth class information given. She plans to take a tour of Labor and Delivery.  Gestational age appropriate obstetric precautions iwere reviewed.  Return in about 4 weeks (around 03/12/2018) for ROB with GTT and 28 week labs.  Marcelyn BruinsJacelyn Franny Selvage, CNM 02/12/2018  1:47 PM

## 2018-02-12 NOTE — Progress Notes (Signed)
No [email or web address].rj 

## 2018-03-12 ENCOUNTER — Ambulatory Visit (INDEPENDENT_AMBULATORY_CARE_PROVIDER_SITE_OTHER): Payer: Medicaid Other | Admitting: Obstetrics & Gynecology

## 2018-03-12 ENCOUNTER — Other Ambulatory Visit: Payer: Medicaid Other

## 2018-03-12 VITALS — BP 120/80 | Wt 194.0 lb

## 2018-03-12 DIAGNOSIS — Z3400 Encounter for supervision of normal first pregnancy, unspecified trimester: Secondary | ICD-10-CM

## 2018-03-12 DIAGNOSIS — Z3A28 28 weeks gestation of pregnancy: Secondary | ICD-10-CM

## 2018-03-12 NOTE — Progress Notes (Signed)
  Subjective  Fetal Movement? yes Contractions? no Leaking Fluid? no Vaginal Bleeding? no  Objective  BP 120/80   Wt 194 lb (88 kg)   LMP 08/28/2017 (Approximate)   BMI 37.89 kg/m  General: NAD Pumonary: no increased work of breathing Abdomen: gravid, non-tender Extremities: no edema Psychiatric: mood appropriate, affect full  Assessment  22 y.o. G1P0 at [redacted]w[redacted]d by  06/04/2018, by Last Menstrual Period presenting for routine prenatal visit  Plan   Problem List Items Addressed This Visit      Other   Supervision of normal first pregnancy, antepartum    Other Visit Diagnoses    [redacted] weeks gestation of pregnancy    -  Primary    Breast feeding Plans patch, counseled will have to wait 6 mos before able to prescribe patch if breast feeding    Declines IUD, Depo, Nexplanon.  Will consider prog-only pill. Declines flu shot.  TDaP nv.  Labs today  Annamarie Major, MD, [facility], [facility] 03/12/2018  9:43 AM

## 2018-03-12 NOTE — Patient Instructions (Signed)
Third Trimester of Pregnancy The third trimester is from week 28 through week 40 (months 7 through 9). The third trimester is a time when the unborn baby (fetus) is growing rapidly. At the end of the ninth month, the fetus is about 20 inches in length and weighs 6-10 pounds. Body changes during your third trimester Your body will continue to go through many changes during pregnancy. The changes vary from woman to woman. During the third trimester:  Your weight will continue to increase. You can expect to gain 25-35 pounds (11-16 kg) by the end of the pregnancy.  You may begin to get stretch marks on your hips, abdomen, and breasts.  You may urinate more often because the fetus is moving lower into your pelvis and pressing on your bladder.  You may develop or continue to have heartburn. This is caused by increased hormones that slow down muscles in the digestive tract.  You may develop or continue to have constipation because increased hormones slow digestion and cause the muscles that push waste through your intestines to relax.  You may develop hemorrhoids. These are swollen veins (varicose veins) in the rectum that can itch or be painful.  You may develop swollen, bulging veins (varicose veins) in your legs.  You may have increased body aches in the pelvis, back, or thighs. This is due to weight gain and increased hormones that are relaxing your joints.  You may have changes in your hair. These can include thickening of your hair, rapid growth, and changes in texture. Some women also have hair loss during or after pregnancy, or hair that feels dry or thin. Your hair will most likely return to normal after your baby is born.  Your breasts will continue to grow and they will continue to become tender. A yellow fluid (colostrum) may leak from your breasts. This is the first milk you are producing for your baby.  Your belly button may stick out.  You may notice more swelling in your hands,  face, or ankles.  You may have increased tingling or numbness in your hands, arms, and legs. The skin on your belly may also feel numb.  You may feel short of breath because of your expanding uterus.  You may have more problems sleeping. This can be caused by the size of your belly, increased need to urinate, and an increase in your body's metabolism.  You may notice the fetus "dropping," or moving lower in your abdomen (lightening).  You may have increased vaginal discharge.  You may notice your joints feel loose and you may have pain around your pelvic bone.  What to expect at prenatal visits You will have prenatal exams every 2 weeks until week 36. Then you will have weekly prenatal exams. During a routine prenatal visit:  You will be weighed to make sure you and the baby are growing normally.  Your blood pressure will be taken.  Your abdomen will be measured to track your baby's growth.  The fetal heartbeat will be listened to.  Any test results from the previous visit will be discussed.  You may have a cervical check near your due date to see if your cervix has softened or thinned (effaced).  You will be tested for Group B streptococcus. This happens between 35 and 37 weeks.  Your health care provider may ask you:  What your birth plan is.  How you are feeling.  If you are feeling the baby move.  If you have had   any abnormal symptoms, such as leaking fluid, bleeding, severe headaches, or abdominal cramping.  If you are using any tobacco products, including cigarettes, chewing tobacco, and electronic cigarettes.  If you have any questions.  Other tests or screenings that may be performed during your third trimester include:  Blood tests that check for low iron levels (anemia).  Fetal testing to check the health, activity level, and growth of the fetus. Testing is done if you have certain medical conditions or if there are problems during the  pregnancy.  Nonstress test (NST). This test checks the health of your baby to make sure there are no signs of problems, such as the baby not getting enough oxygen. During this test, a belt is placed around your belly. The baby is made to move, and its heart rate is monitored during movement.  What is false labor? False labor is a condition in which you feel small, irregular tightenings of the muscles in the womb (contractions) that usually go away with rest, changing position, or drinking water. These are called Braxton Hicks contractions. Contractions may last for hours, days, or even weeks before true labor sets in. If contractions come at regular intervals, become more frequent, increase in intensity, or become painful, you should see your health care provider. What are the signs of labor?  Abdominal cramps.  Regular contractions that start at 10 minutes apart and become stronger and more frequent with time.  Contractions that start on the top of the uterus and spread down to the lower abdomen and back.  Increased pelvic pressure and dull back pain.  A watery or bloody mucus discharge that comes from the vagina.  Leaking of amniotic fluid. This is also known as your "water breaking." It could be a slow trickle or a gush. Let your health care provider know if it has a color or strange odor. If you have any of these signs, call your health care provider right away, even if it is before your due date. Follow these instructions at home: Medicines  Follow your health care provider's instructions regarding medicine use. Specific medicines may be either safe or unsafe to take during pregnancy.  Take a prenatal vitamin that contains at least 600 micrograms (mcg) of folic acid.  If you develop constipation, try taking a stool softener if your health care provider approves. Eating and drinking  Eat a balanced diet that includes fresh fruits and vegetables, whole grains, good sources of protein  such as meat, eggs, or tofu, and low-fat dairy. Your health care provider will help you determine the amount of weight gain that is right for you.  Avoid raw meat and uncooked cheese. These carry germs that can cause birth defects in the baby.  If you have low calcium intake from food, talk to your health care provider about whether you should take a daily calcium supplement.  Eat four or five small meals rather than three large meals a day.  Limit foods that are high in fat and processed sugars, such as fried and sweet foods.  To prevent constipation: ? Drink enough fluid to keep your urine clear or pale yellow. ? Eat foods that are high in fiber, such as fresh fruits and vegetables, whole grains, and beans. Activity  Exercise only as directed by your health care provider. Most women can continue their usual exercise routine during pregnancy. Try to exercise for 30 minutes at least 5 days a week. Stop exercising if you experience uterine contractions.  Avoid heavy   lifting.  Do not exercise in extreme heat or humidity, or at high altitudes.  Wear low-heel, comfortable shoes.  Practice good posture.  You may continue to have sex unless your health care provider tells you otherwise. Relieving pain and discomfort  Take frequent breaks and rest with your legs elevated if you have leg cramps or low back pain.  Take warm sitz baths to soothe any pain or discomfort caused by hemorrhoids. Use hemorrhoid cream if your health care provider approves.  Wear a good support bra to prevent discomfort from breast tenderness.  If you develop varicose veins: ? Wear support pantyhose or compression stockings as told by your healthcare provider. ? Elevate your feet for 15 minutes, 3-4 times a day. Prenatal care  Write down your questions. Take them to your prenatal visits.  Keep all your prenatal visits as told by your health care provider. This is important. Safety  Wear your seat belt at  all times when driving.  Make a list of emergency phone numbers, including numbers for family, friends, the hospital, and police and fire departments. General instructions  Avoid cat litter boxes and soil used by cats. These carry germs that can cause birth defects in the baby. If you have a cat, ask someone to clean the litter box for you.  Do not travel far distances unless it is absolutely necessary and only with the approval of your health care provider.  Do not use hot tubs, steam rooms, or saunas.  Do not drink alcohol.  Do not use any products that contain nicotine or tobacco, such as cigarettes and e-cigarettes. If you need help quitting, ask your health care provider.  Do not use any medicinal herbs or unprescribed drugs. These chemicals affect the formation and growth of the baby.  Do not douche or use tampons or scented sanitary pads.  Do not cross your legs for long periods of time.  To prepare for the arrival of your baby: ? Take prenatal classes to understand, practice, and ask questions about labor and delivery. ? Make a trial run to the hospital. ? Visit the hospital and tour the maternity area. ? Arrange for maternity or paternity leave through employers. ? Arrange for family and friends to take care of pets while you are in the hospital. ? Purchase a rear-facing car seat and make sure you know how to install it in your car. ? Pack your hospital bag. ? Prepare the baby's nursery. Make sure to remove all pillows and stuffed animals from the baby's crib to prevent suffocation.  Visit your dentist if you have not gone during your pregnancy. Use a soft toothbrush to brush your teeth and be gentle when you floss. Contact a health care provider if:  You are unsure if you are in labor or if your water has broken.  You become dizzy.  You have mild pelvic cramps, pelvic pressure, or nagging pain in your abdominal area.  You have lower back pain.  You have persistent  nausea, vomiting, or diarrhea.  You have an unusual or bad smelling vaginal discharge.  You have pain when you urinate. Get help right away if:  Your water breaks before 37 weeks.  You have regular contractions less than 5 minutes apart before 37 weeks.  You have a fever.  You are leaking fluid from your vagina.  You have spotting or bleeding from your vagina.  You have severe abdominal pain or cramping.  You have rapid weight loss or weight gain.    You have shortness of breath with chest pain.  You notice sudden or extreme swelling of your face, hands, ankles, feet, or legs.  Your baby makes fewer than 10 movements in 2 hours.  You have severe headaches that do not go away when you take medicine.  You have vision changes. Summary  The third trimester is from week 28 through week 40, months 7 through 9. The third trimester is a time when the unborn baby (fetus) is growing rapidly.  During the third trimester, your discomfort may increase as you and your baby continue to gain weight. You may have abdominal, leg, and back pain, sleeping problems, and an increased need to urinate.  During the third trimester your breasts will keep growing and they will continue to become tender. A yellow fluid (colostrum) may leak from your breasts. This is the first milk you are producing for your baby.  False labor is a condition in which you feel small, irregular tightenings of the muscles in the womb (contractions) that eventually go away. These are called Braxton Hicks contractions. Contractions may last for hours, days, or even weeks before true labor sets in.  Signs of labor can include: abdominal cramps; regular contractions that start at 10 minutes apart and become stronger and more frequent with time; watery or bloody mucus discharge that comes from the vagina; increased pelvic pressure and dull back pain; and leaking of amniotic fluid. This information is not intended to replace advice  given to you by your health care provider. Make sure you discuss any questions you have with your health care provider. Document Released: 06/13/2001 Document Revised: 11/25/2015 Document Reviewed: 08/20/2012 Elsevier Interactive Patient Education  2017 [employer]  

## 2018-03-13 ENCOUNTER — Other Ambulatory Visit: Payer: Self-pay | Admitting: Maternal Newborn

## 2018-03-13 DIAGNOSIS — R7309 Other abnormal glucose: Secondary | ICD-10-CM

## 2018-03-13 DIAGNOSIS — Z3400 Encounter for supervision of normal first pregnancy, unspecified trimester: Secondary | ICD-10-CM

## 2018-03-13 DIAGNOSIS — O99019 Anemia complicating pregnancy, unspecified trimester: Secondary | ICD-10-CM

## 2018-03-13 LAB — 28 WEEK RH+PANEL
Basophils Absolute: 0 10*3/uL (ref 0.0–0.2)
Basos: 0 %
EOS (ABSOLUTE): 0 10*3/uL (ref 0.0–0.4)
Eos: 0 %
Gestational Diabetes Screen: 169 mg/dL — ABNORMAL HIGH (ref 65–139)
HIV Screen 4th Generation wRfx: NONREACTIVE
Hematocrit: 33.4 % — ABNORMAL LOW (ref 34.0–46.6)
Hemoglobin: 10.8 g/dL — ABNORMAL LOW (ref 11.1–15.9)
Immature Grans (Abs): 0.3 10*3/uL — ABNORMAL HIGH (ref 0.0–0.1)
Immature Granulocytes: 3 %
Lymphocytes Absolute: 2.1 10*3/uL (ref 0.7–3.1)
Lymphs: 19 %
MCH: 27.3 pg (ref 26.6–33.0)
MCHC: 32.3 g/dL (ref 31.5–35.7)
MCV: 84 fL (ref 79–97)
Monocytes Absolute: 0.7 10*3/uL (ref 0.1–0.9)
Monocytes: 6 %
Neutrophils Absolute: 7.7 10*3/uL — ABNORMAL HIGH (ref 1.4–7.0)
Neutrophils: 72 %
Platelets: 201 10*3/uL (ref 150–450)
RBC: 3.96 x10E6/uL (ref 3.77–5.28)
RDW: 14.1 % (ref 12.3–15.4)
RPR Ser Ql: NONREACTIVE
WBC: 10.8 10*3/uL (ref 3.4–10.8)

## 2018-03-13 MED ORDER — FERROUS SULFATE 325 (65 FE) MG PO TABS: 325.0000 mg | ORAL_TABLET | Freq: Every day | ORAL | 4 refills | Status: DC

## 2018-03-13 NOTE — Progress Notes (Signed)
Rx sent for iron and 3 hour GTT ordered. Patient will schedule lab visit.

## 2018-03-20 ENCOUNTER — Telehealth: Payer: Self-pay | Admitting: Maternal Newborn

## 2018-03-20 NOTE — Telephone Encounter (Signed)
Patient states iron Rx that was sent in is not covered by insurance and asking if there is something else that could be sent?  [facility] [address]

## 2018-03-21 NOTE — Telephone Encounter (Signed)
I sent her a patient message about substituting an over the counter version as there is not an Rx that would be more affordable or covered.

## 2018-03-25 ENCOUNTER — Other Ambulatory Visit: Payer: Medicaid Other

## 2018-03-25 DIAGNOSIS — R7309 Other abnormal glucose: Secondary | ICD-10-CM

## 2018-03-26 LAB — GESTATIONAL GLUCOSE TOLERANCE
GLUCOSE 2 HOUR GTT: 123 mg/dL (ref 65–154)
GLUCOSE 3 HOUR GTT: 119 mg/dL (ref 65–139)
GLUCOSE FASTING: 108 mg/dL — AB (ref 65–94)
Glucose, GTT - 1 Hour: 161 mg/dL (ref 65–179)

## 2018-03-27 ENCOUNTER — Ambulatory Visit (INDEPENDENT_AMBULATORY_CARE_PROVIDER_SITE_OTHER): Payer: Medicaid Other | Admitting: Obstetrics & Gynecology

## 2018-03-27 VITALS — BP 120/80 | Wt 194.0 lb

## 2018-03-27 DIAGNOSIS — Z3403 Encounter for supervision of normal first pregnancy, third trimester: Secondary | ICD-10-CM

## 2018-03-27 DIAGNOSIS — Z23 Encounter for immunization: Secondary | ICD-10-CM

## 2018-03-27 DIAGNOSIS — Z3A30 30 weeks gestation of pregnancy: Secondary | ICD-10-CM

## 2018-03-27 DIAGNOSIS — Z3400 Encounter for supervision of normal first pregnancy, unspecified trimester: Secondary | ICD-10-CM

## 2018-03-27 MED ORDER — TETANUS-DIPHTH-ACELL PERTUSSIS 5-2.5-18.5 LF-MCG/0.5 IM SUSP
0.5000 mL | Freq: Once | INTRAMUSCULAR | Status: AC
  Administered 2018-03-27: 0.5 mL via INTRAMUSCULAR

## 2018-03-27 NOTE — Patient Instructions (Signed)

## 2018-03-27 NOTE — Progress Notes (Signed)
  Subjective  Fetal Movement? yes Contractions? no Leaking Fluid? no Vaginal Bleeding? no  Objective  BP 120/80   Wt 194 lb (88 kg)   LMP 08/28/2017 (Approximate)   BMI 37.89 kg/m  General: NAD Pumonary: no increased work of breathing Abdomen: gravid, non-tender Extremities: no edema Psychiatric: mood appropriate, affect full  Assessment  22 y.o. G1P0 at [redacted]w[redacted]d by  06/04/2018, by Last Menstrual Period presenting for routine prenatal visit  Plan   Problem List Items Addressed This Visit      Other   Supervision of normal first pregnancy, antepartum    Other Visit Diagnoses    [redacted] weeks gestation of pregnancy    -  Primary    TDaP  Annamarie Major, MD, [facility], [facility] 03/27/2018  10:25 AM

## 2018-03-27 NOTE — Addendum Note (Signed)
Addended by: Cornelius Moras D on: 03/27/2018 10:33 AM   Modules accepted: Orders

## 2018-04-01 ENCOUNTER — Other Ambulatory Visit: Payer: Self-pay

## 2018-04-01 ENCOUNTER — Emergency Department
Admission: EM | Admit: 2018-04-01 | Discharge: 2018-04-01 | Disposition: A | Discharge status: Home / Self Care | Payer: Medicaid Other | Attending: Emergency Medicine | Admitting: Emergency Medicine

## 2018-04-01 DIAGNOSIS — Z3A31 31 weeks gestation of pregnancy: Secondary | ICD-10-CM | POA: Insufficient documentation

## 2018-04-01 DIAGNOSIS — O9989 Other specified diseases and conditions complicating pregnancy, childbirth and the puerperium: Secondary | ICD-10-CM | POA: Insufficient documentation

## 2018-04-01 DIAGNOSIS — Z5321 Procedure and treatment not carried out due to patient leaving prior to being seen by health care provider: Secondary | ICD-10-CM | POA: Insufficient documentation

## 2018-04-01 DIAGNOSIS — R51 Headache: Secondary | ICD-10-CM | POA: Insufficient documentation

## 2018-04-01 NOTE — ED Triage Notes (Signed)
Pt is [redacted] weeks pregnant, pt is c/o HA intermittent for the past 3 days . States she  Has a hx of migraines, has been taking tylenol but is only getting temporary relief.

## 2018-04-10 ENCOUNTER — Encounter: Payer: Self-pay | Admitting: Obstetrics and Gynecology

## 2018-04-10 ENCOUNTER — Ambulatory Visit (INDEPENDENT_AMBULATORY_CARE_PROVIDER_SITE_OTHER): Payer: Medicaid Other | Admitting: Obstetrics and Gynecology

## 2018-04-10 VITALS — BP 122/74 | Wt 200.0 lb

## 2018-04-10 DIAGNOSIS — Z3A32 32 weeks gestation of pregnancy: Secondary | ICD-10-CM

## 2018-04-10 DIAGNOSIS — Z3400 Encounter for supervision of normal first pregnancy, unspecified trimester: Secondary | ICD-10-CM

## 2018-04-10 NOTE — Progress Notes (Signed)
  Routine Prenatal Care Visit  Subjective  Felicia Jones is a 22 y.o. G1P0 at [redacted]w[redacted]d being seen today for ongoing prenatal care.  She is currently monitored for the following issues for this low-risk pregnancy and has Bilateral low back pain without sciatica and Supervision of normal first pregnancy, antepartum on their problem list.  ----------------------------------------------------------------------------------- Patient reports no complaints.   Contractions: Not present. Vag. Bleeding: None.  Movement: Present. Denies leaking of fluid.  ----------------------------------------------------------------------------------- The following portions of the patient's history were reviewed and updated as appropriate: allergies, current medications, past family history, past medical history, past social history, past surgical history and problem list. Problem list updated.   Objective  Blood pressure 122/74, weight 200 lb (90.7 kg), last menstrual period 08/28/2017. Pregravid weight 175 lb (79.4 kg) Total Weight Gain 25 lb (11.3 kg) Urinalysis: Urine Protein    Urine Glucose    Fetal Status: Fetal Heart Rate (bpm): 150 Fundal Height: 34 cm Movement: Present     General:  Alert, oriented and cooperative. Patient is in no acute distress.  Skin: Skin is warm and dry. No rash noted.   Cardiovascular: Normal heart rate noted  Respiratory: Normal respiratory effort, no problems with respiration noted  Abdomen: Soft, gravid, appropriate for gestational age. Pain/Pressure: Absent     Pelvic:  Cervical exam deferred        Extremities: Normal range of motion.  Edema: None  Mental Status: Normal mood and affect. Normal behavior. Normal judgment and thought content.   Assessment   22 y.o. G1P0 at [redacted]w[redacted]d by  05/31/2018, Alternate EDD Entry presenting for routine prenatal visit  Plan   FIRST Problems (from 10/16/17 to present)    Problem Noted Resolved   Supervision of normal first pregnancy,  antepartum 10/16/2017 by Oswaldo Conroy, CNM No   Overview Addendum 02/12/2018  1:51 PM by Oswaldo Conroy, CNM    [facility] Prenatal Labs  Dating L=6 Blood type: B/Positive/-- (04/23 1023)   Genetic Screen Inheritest: negative NIPS: Normal Female Antibody:Negative (04/23 1023)  Anatomic [place] Female, complete 8/13 Rubella: 2.40 (04/23 1023) Varicella: Immune  GTT Early: 119              Third trimester:  RPR: Non Reactive (04/23 1023)   Rhogam N/A HBsAg: Negative (04/23 1023)   TDaP vaccine                       Flu Shot: HIV: Non Reactive (04/23 1023)   Baby Food                                GBS:   Contraception  Pap: N/A, less than 46 years old  CBB     Support Person                  Preterm labor symptoms and general obstetric precautions including but not limited to vaginal bleeding, contractions, leaking of fluid and fetal movement were reviewed in detail with the patient. Please refer to After Visit Summary for other counseling recommendations.   Return in about 2 weeks (around 04/24/2018) for Routine Prenatal Appointment.  Thomasene Mohair, MD, [facility], [facility] 04/10/2018 10:38 AM

## 2018-04-11 ENCOUNTER — Telehealth: Payer: Self-pay

## 2018-04-11 NOTE — Telephone Encounter (Signed)
Pt calling for work note.  [phone]  Pt states her last day of work is Nov. 1st.  They need to have note on file.

## 2018-04-17 NOTE — Telephone Encounter (Signed)
Faxed letter to # provided and also left letter up front for pt to pick up if needed. Please let her know if she returns my call

## 2018-04-21 ENCOUNTER — Observation Stay
Admission: EM | Admit: 2018-04-21 | Discharge: 2018-04-21 | Disposition: A | Discharge status: Home / Self Care | Payer: Medicaid Other | Attending: Certified Nurse Midwife | Admitting: Certified Nurse Midwife

## 2018-04-21 DIAGNOSIS — R102 Pelvic and perineal pain: Secondary | ICD-10-CM | POA: Diagnosis not present

## 2018-04-21 DIAGNOSIS — O368130 Decreased fetal movements, third trimester, not applicable or unspecified: Secondary | ICD-10-CM | POA: Diagnosis not present

## 2018-04-21 DIAGNOSIS — Z3A34 34 weeks gestation of pregnancy: Secondary | ICD-10-CM | POA: Diagnosis not present

## 2018-04-21 DIAGNOSIS — O26893 Other specified pregnancy related conditions, third trimester: Secondary | ICD-10-CM

## 2018-04-21 DIAGNOSIS — O9989 Other specified diseases and conditions complicating pregnancy, childbirth and the puerperium: Secondary | ICD-10-CM | POA: Diagnosis present

## 2018-04-21 DIAGNOSIS — O26899 Other specified pregnancy related conditions, unspecified trimester: Secondary | ICD-10-CM | POA: Diagnosis present

## 2018-04-21 DIAGNOSIS — Z3400 Encounter for supervision of normal first pregnancy, unspecified trimester: Secondary | ICD-10-CM

## 2018-04-21 DIAGNOSIS — R109 Unspecified abdominal pain: Secondary | ICD-10-CM

## 2018-04-21 DIAGNOSIS — R1031 Right lower quadrant pain: Secondary | ICD-10-CM | POA: Diagnosis not present

## 2018-04-21 LAB — URINALYSIS, COMPLETE (UACMP) WITH MICROSCOPIC
Bilirubin Urine: NEGATIVE
GLUCOSE, UA: NEGATIVE mg/dL
Hgb urine dipstick: NEGATIVE
Leukocytes, UA: NEGATIVE
Nitrite: NEGATIVE
PH: 6.5 (ref 5.0–8.0)
PROTEIN: NEGATIVE mg/dL
Specific Gravity, Urine: 1.025 (ref 1.005–1.030)

## 2018-04-21 NOTE — Discharge Instructions (Signed)
Return to hospital when:  -Contractions are every 5 minutes  -Vaginal bleeding or abdomen becomes "rock hard" and will not relax  -Leakage of fluids -Decreased fetal movement

## 2018-04-21 NOTE — OB Triage Note (Signed)
Pt reports to unit via ED c/o right lower abdominal pain that has been consistent throughout pregnancy but became much painful since 10/19 around 2230 and decreased fetal movement. Pt rates pain 8/10 as a cramping pain that comes and goes. Denies vag bleeding, LOF, and positive fetal movement (just not as much as usual). Vital signs WDL. External monitors applied and assessing. Will continue to monitor.

## 2018-04-22 NOTE — Final Progress Note (Signed)
Physician Final Progress Note  Patient ID: Felicia Jones MRN: [id number] DOB/AGE: 19-Sep-1995 22 y.o.  Admit date: 04/21/2018 Admitting provider: Nadara Mustard, MD/Zadkiel Dragan L. Sharen Hones, CNM Discharge date: 04/21/2018  Admission Diagnoses: IUP at 34wk2day with RLQ abdominal pain  Discharge Diagnoses:  IUP at 34 weeks 2days with right round ligament pain  Consults: None  Significant Findings/ Diagnostic Studies: 22 year old G1 P0 with EDC=05/31/2018 presented at 34wk 2 days with complaints of intermittent RLQ pain. The pain has been present for weeks but has gotten more uncomfortable over the last 2 days. She rates the pain as a 8/10 and the pain usually lasts 2-3 minutes. She did have a fall 2 days ago, falling on hands and knees. Her movement seems to make the pain worse. Tylenol did not help the pain.  She reports decreased fetal movement today (not as much as usual). Denies vaginal bleeding or leakage of fluid. No fever.  [facility] care at [facility] is also remarkable for the following: [facility] Prenatal Labs  Dating L=6 Blood type: B/Positive/-- (04/23 1023)   Genetic Screen Inheritest: negative NIPS: Normal Female Antibody:Negative (04/23 1023)  Anatomic [place] Female, complete 8/13 Rubella: 2.40 (04/23 1023) Varicella: Immune  GTT Early: 119              Third trimester: 169. 3hr GTT: 108/ 161/ 123/119 RPR: Non Reactive (04/23 1023)   Rhogam N/A HBsAg: Negative (04/23 1023)   TDaP vaccine     9/25                  Flu Shot: HIV: Non Reactive (04/23 1023)   Baby Food                                GBS:   Contraception  Pap: N/A, less than 52 years old  CBB     Support Person         Exam: BP (!) 121/47 (BP Location: Left Arm)   Pulse (!) 106   Temp 98.2 F (36.8 C) (Oral)   Resp 19   LMP 08/28/2017 (Approximate)   General: gravid BF in NAD Abdomen: soft uterus and upper abdomen; mild tenderness on right side abdomen near round ligament insertion site. Cephalic on  Leopold's Skin: warm and dry Neuro: alert, awake and oriented Psyche: mildly anxious, full affect  FHR: 135 with accelerations to 160s, moderate variability Toco: some irritability  Ultrasound: cephalic presentation / LOP  Results for orders placed or performed during the hospital encounter of 04/21/18 (from the past 24 hour(s))  Urinalysis, Complete w Microscopic     Status: Abnormal   Collection Time: 04/21/18  9:30 PM  Result Value Ref Range   Color, Urine YELLOW YELLOW   APPearance CLEAR CLEAR   Specific Gravity, Urine 1.025 1.005 - 1.030   pH 6.5 5.0 - 8.0   Glucose, UA NEGATIVE NEGATIVE mg/dL   Hgb urine dipstick NEGATIVE NEGATIVE   Bilirubin Urine NEGATIVE NEGATIVE   Ketones, ur TRACE (A) NEGATIVE mg/dL   Protein, ur NEGATIVE NEGATIVE mg/dL   Nitrite NEGATIVE NEGATIVE   Leukocytes, UA NEGATIVE NEGATIVE   Squamous Epithelial / LPF 6-10 0 - 5   WBC, UA 0-5 0 - 5 WBC/hpf   RBC / HPF 0-5 0 - 5 RBC/hpf   Bacteria, UA RARE (A) NONE SEEN   Mucus PRESENT    A:Suspect right round ligament pain Reactive NST P: Discussed  treatment for round ligament pain-heating pad/ maternity support garment/ warm bath Patient relieved to find out why she was having the pain on her right side. Follow up at [facility] in 3 days for ROB.  Procedures: none  Discharge Condition: good  Disposition: home  Diet: Regular diet  Discharge Activity: Activity as tolerated   Allergies as of 04/21/2018   No Known Allergies     Medication List    TAKE these medications   ferrous sulfate 325 (65 FE) MG tablet Take 1 tablet (325 mg total) by mouth daily with breakfast.   Prenatal Vitamin 27-0.8 MG Tabs Take 1 tablet by mouth daily.        Total time spent taking care of this patient: 20 minutes  Signed: Farrel Conners 04/22/2018, 6:15 PM

## 2018-04-24 ENCOUNTER — Encounter: Payer: Self-pay | Admitting: Obstetrics and Gynecology

## 2018-04-24 ENCOUNTER — Ambulatory Visit (INDEPENDENT_AMBULATORY_CARE_PROVIDER_SITE_OTHER): Payer: Medicaid Other | Admitting: Obstetrics and Gynecology

## 2018-04-24 VITALS — BP 124/82 | Wt 201.0 lb

## 2018-04-24 DIAGNOSIS — R109 Unspecified abdominal pain: Secondary | ICD-10-CM

## 2018-04-24 DIAGNOSIS — O26899 Other specified pregnancy related conditions, unspecified trimester: Secondary | ICD-10-CM

## 2018-04-24 DIAGNOSIS — Z3A34 34 weeks gestation of pregnancy: Secondary | ICD-10-CM

## 2018-04-24 DIAGNOSIS — Z3400 Encounter for supervision of normal first pregnancy, unspecified trimester: Secondary | ICD-10-CM

## 2018-04-24 NOTE — Progress Notes (Signed)
Routine Prenatal Care Visit  Subjective  Felicia Jones is a 23 y.o. G1P0 at [redacted]w[redacted]d being seen today for ongoing prenatal care.  She is currently monitored for the following issues for this low-risk pregnancy and has Bilateral low back pain without sciatica; Supervision of normal first pregnancy, antepartum; and Abdominal pain affecting pregnancy, antepartum on their problem list.  ----------------------------------------------------------------------------------- Patient reports continued and worsening RLQ pain. Present x 2 weeks. Getting worse. Worse with movement. Denies associated symptoms.  Denies fevers, chills, nausea/emesis.  Contractions: Irregular. Vag. Bleeding: None.  Movement: Present. Denies leaking of fluid.  ----------------------------------------------------------------------------------- The following portions of the patient's history were reviewed and updated as appropriate: allergies, current medications, past family history, past medical history, past social history, past surgical history and problem list. Problem list updated.   Objective  Blood pressure 124/82, weight 201 lb (91.2 kg), last menstrual period 08/28/2017. Pregravid weight 175 lb (79.4 kg) Total Weight Gain 26 lb (11.8 kg) Urinalysis: Urine Protein    Urine Glucose    Fetal Status: Fetal Heart Rate (bpm): 150 Fundal Height: 34 cm Movement: Present     General:  Alert, oriented and cooperative. Patient is in no acute distress.  Skin: Skin is warm and dry. No rash noted.   Cardiovascular: Normal heart rate noted  Respiratory: Normal respiratory effort, no problems with respiration noted  Abdomen: Soft, gravid, appropriate for gestational age. Pain/Pressure: Present     Pelvic:  Cervical exam deferred        Extremities: Normal range of motion.  Edema: None  Mental Status: Normal mood and affect. Normal behavior. Normal judgment and thought content.   Assessment   22 y.o. G1P0 at [redacted]w[redacted]d by  05/31/2018,  Alternate EDD Entry presenting for routine prenatal visit  Plan   FIRST Problems (from 10/16/17 to present)    Problem Noted Resolved   Supervision of normal first pregnancy, antepartum 10/16/2017 by Oswaldo Conroy, CNM No   Overview Addendum 04/22/2018  7:37 PM by Farrel Conners, CNM    Clinic [facility] Prenatal Labs  Dating L=6 Blood type: B/Positive/-- (04/23 1023)   Genetic Screen Inheritest: negative NIPS: Normal Female Antibody:Negative (04/23 1023)  Anatomic [place] Female, complete 8/13 Rubella: 2.40 (04/23 1023) Varicella: Immune  GTT Early: 119              Third trimester:  RPR: Non Reactive (04/23 1023)   Rhogam N/A HBsAg: Negative (04/23 1023)   TDaP vaccine                       Flu Shot: HIV: Non Reactive (04/23 1023)   Baby Food                                GBS:   Contraception  Pap: N/A, less than 35 years old  CBB     Support Person                  Preterm labor symptoms and general obstetric precautions including but not limited to vaginal bleeding, contractions, leaking of fluid and fetal movement were reviewed in detail with the patient. Please refer to After Visit Summary for other counseling recommendations.   - precautions for appendicitis given. She was instructed to return to the hospital should her symptoms worsen or if she develops, fevers, chills, nausea, emesis and worsening pain.   Return in about 2 weeks (around 05/08/2018) for Routine  Prenatal Appointment.  Thomasene Mohair, MD, [facility], [facility] 04/24/2018 11:44 AM

## 2018-05-08 ENCOUNTER — Ambulatory Visit (INDEPENDENT_AMBULATORY_CARE_PROVIDER_SITE_OTHER): Payer: Medicaid Other | Admitting: Maternal Newborn

## 2018-05-08 ENCOUNTER — Encounter: Payer: Self-pay | Admitting: Maternal Newborn

## 2018-05-08 ENCOUNTER — Other Ambulatory Visit (HOSPITAL_COMMUNITY)
Admission: RE | Admit: 2018-05-08 | Discharge: 2018-05-08 | Disposition: A | Discharge status: Home / Self Care | Payer: Medicaid Other | Source: Ambulatory Visit | Attending: Maternal Newborn | Admitting: Maternal Newborn

## 2018-05-08 VITALS — BP 118/64 | Wt 204.0 lb

## 2018-05-08 DIAGNOSIS — Z113 Encounter for screening for infections with a predominantly sexual mode of transmission: Secondary | ICD-10-CM | POA: Diagnosis present

## 2018-05-08 DIAGNOSIS — Z3400 Encounter for supervision of normal first pregnancy, unspecified trimester: Secondary | ICD-10-CM | POA: Insufficient documentation

## 2018-05-08 DIAGNOSIS — Z369 Encounter for antenatal screening, unspecified: Secondary | ICD-10-CM

## 2018-05-08 DIAGNOSIS — O26893 Other specified pregnancy related conditions, third trimester: Secondary | ICD-10-CM

## 2018-05-08 DIAGNOSIS — N898 Other specified noninflammatory disorders of vagina: Secondary | ICD-10-CM

## 2018-05-08 DIAGNOSIS — Z3A36 36 weeks gestation of pregnancy: Secondary | ICD-10-CM

## 2018-05-08 LAB — POCT URINALYSIS DIPSTICK OB
Glucose, UA: NEGATIVE
PROTEIN: NEGATIVE

## 2018-05-08 NOTE — Progress Notes (Signed)
ROB GBS/Aptima 

## 2018-05-08 NOTE — Patient Instructions (Signed)
Vaginal Delivery Vaginal delivery means that you will give birth by pushing your baby out of your birth canal (vagina). A team of health care providers will help you before, during, and after vaginal delivery. Birth experiences are unique for every woman and every pregnancy, and birth experiences vary depending on where you choose to give birth. What should I do to prepare for my baby's birth? Before your baby is born, it is important to talk with your health care provider about:  Your labor and delivery preferences. These may include: ? Medicines that you may be given. ? How you will manage your pain. This might include non-medical pain relief techniques or injectable pain relief such as epidural analgesia. ? How you and your baby will be monitored during labor and delivery. ? Who may be in the labor and delivery room with you. ? Your feelings about surgical delivery of your baby (cesarean delivery, or C-section) if this becomes necessary. ? Your feelings about receiving donated blood through an IV tube (blood transfusion) if this becomes necessary.  Whether you are able: ? To take pictures or videos of the birth. ? To eat during labor and delivery. ? To move around, walk, or change positions during labor and delivery.  What to expect after your baby is born, such as: ? Whether delayed umbilical cord clamping and cutting is offered. ? Who will care for your baby right after birth. ? Medicines or tests that may be recommended for your baby. ? Whether breastfeeding is supported in your hospital or birth center. ? How long you will be in the hospital or birth center.  How any medical conditions you have may affect your baby or your labor and delivery experience.  To prepare for your baby's birth, you should also:  Attend all of your health care visits before delivery (prenatal visits) as recommended by your health care provider. This is important.  Prepare your home for your baby's  arrival. Make sure that you have: ? Diapers. ? Baby clothing. ? Feeding equipment. ? Safe sleeping arrangements for you and your baby.  Install a car seat in your vehicle. Have your car seat checked by a certified car seat installer to make sure that it is installed safely.  Think about who will help you with your new baby at home for at least the first several weeks after delivery.  What can I expect when I arrive at the birth center or hospital? Once you are in labor and have been admitted into the hospital or birth center, your health care provider may:  Review your pregnancy history and any concerns you have.  Insert an IV tube into one of your veins. This is used to give you fluids and medicines.  Check your blood pressure, pulse, temperature, and heart rate (vital signs).  Check whether your bag of water (amniotic sac) has broken (ruptured).  Talk with you about your birth plan and discuss pain control options.  Monitoring Your health care provider may monitor your contractions (uterine monitoring) and your baby's heart rate (fetal monitoring). You may need to be monitored:  Often, but not continuously (intermittently).  All the time or for long periods at a time (continuously). Continuous monitoring may be needed if: ? You are taking certain medicines, such as medicine to relieve pain or make your contractions stronger. ? You have pregnancy or labor complications.  Monitoring may be done by:  Placing a special stethoscope or a handheld monitoring device on your abdomen to   check your baby's heartbeat, and feeling your abdomen for contractions. This method of monitoring does not continuously record your baby's heartbeat or your contractions.  Placing monitors on your abdomen (external monitors) to record your baby's heartbeat and the frequency and length of contractions. You may not have to wear external monitors all the time.  Placing monitors inside of your uterus  (internal monitors) to record your baby's heartbeat and the frequency, length, and strength of your contractions. ? Your health care provider may use internal monitors if he or she needs more information about the strength of your contractions or your baby's heart rate. ? Internal monitors are put in place by passing a thin, flexible wire through your vagina and into your uterus. Depending on the type of monitor, it may remain in your uterus or on your baby's head until birth. ? Your health care provider will discuss the benefits and risks of internal monitoring with you and will ask for your permission before inserting the monitors.  Telemetry. This is a type of continuous monitoring that can be done with external or internal monitors. Instead of having to stay in bed, you are able to move around during telemetry. Ask your health care provider if telemetry is an option for you.  Physical exam Your health care provider may perform a physical exam. This may include:  Checking whether your baby is positioned: ? With the head toward your vagina (head-down). This is most common. ? With the head toward the top of your uterus (head-up or breech). If your baby is in a breech position, your health care provider may try to turn your baby to a head-down position so you can deliver vaginally. If it does not seem that your baby can be born vaginally, your provider may recommend surgery to deliver your baby. In rare cases, you may be able to deliver vaginally if your baby is head-up (breech delivery). ? Lying sideways (transverse). Babies that are lying sideways cannot be delivered vaginally.  Checking your cervix to determine: ? Whether it is thinning out (effacing). ? Whether it is opening up (dilating). ? How low your baby has moved into your birth canal.  What are the three stages of labor and delivery?  Normal labor and delivery is divided into the following three stages: Stage 1  Stage 1 is the  longest stage of labor, and it can last for hours or days. Stage 1 includes: ? Early labor. This is when contractions may be irregular, or regular and mild. Generally, early labor contractions are more than 10 minutes apart. ? Active labor. This is when contractions get longer, more regular, more frequent, and more intense. ? The transition phase. This is when contractions happen very close together, are very intense, and may last longer than during any other part of labor.  Contractions generally feel mild, infrequent, and irregular at first. They get stronger, more frequent (about every 2-3 minutes), and more regular as you progress from early labor through active labor and transition.  Many women progress through stage 1 naturally, but you may need help to continue making progress. If this happens, your health care provider may talk with you about: ? Rupturing your amniotic sac if it has not ruptured yet. ? Giving you medicine to help make your contractions stronger and more frequent.  Stage 1 ends when your cervix is completely dilated to 4 inches (10 cm) and completely effaced. This happens at the end of the transition phase. Stage 2  Once   your cervix is completely effaced and dilated to 4 inches (10 cm), you may start to feel an urge to push. It is common for the body to naturally take a rest before feeling the urge to push, especially if you received an epidural or certain other pain medicines. This rest period may last for up to 1-2 hours, depending on your unique labor experience.  During stage 2, contractions are generally less painful, because pushing helps relieve contraction pain. Instead of contraction pain, you may feel stretching and burning pain, especially when the widest part of your baby's head passes through the vaginal opening (crowning).  Your health care provider will closely monitor your pushing progress and your baby's progress through the vagina during stage 2.  Your  health care provider may massage the area of skin between your vaginal opening and anus (perineum) or apply warm compresses to your perineum. This helps it stretch as the baby's head starts to crown, which can help prevent perineal tearing. ? In some cases, an incision may be made in your perineum (episiotomy) to allow the baby to pass through the vaginal opening. An episiotomy helps to make the opening of the vagina larger to allow more room for the baby to fit through.  It is very important to breathe and focus so your health care provider can control the delivery of your baby's head. Your health care provider may have you decrease the intensity of your pushing, to help prevent perineal tearing.  After delivery of your baby's head, the shoulders and the rest of the body generally deliver very quickly and without difficulty.  Once your baby is delivered, the umbilical cord may be cut right away, or this may be delayed for 1-2 minutes, depending on your baby's health. This may vary among health care providers, hospitals, and birth centers.  If you and your baby are healthy enough, your baby may be placed on your chest or abdomen to help maintain the baby's temperature and to help you bond with each other. Some mothers and babies start breastfeeding at this time. Your health care team will dry your baby and help keep your baby warm during this time.  Your baby may need immediate care if he or she: ? Showed signs of distress during labor. ? Has a medical condition. ? Was born too early (prematurely). ? Had a bowel movement before birth (meconium). ? Shows signs of difficulty transitioning from being inside the uterus to being outside of the uterus. If you are planning to breastfeed, your health care team will help you begin a feeding. Stage 3  The third stage of labor starts immediately after the birth of your baby and ends after you deliver the placenta. The placenta is an organ that develops  during pregnancy to provide oxygen and nutrients to your baby in the womb.  Delivering the placenta may require some pushing, and you may have mild contractions. Breastfeeding can stimulate contractions to help you deliver the placenta.  After the placenta is delivered, your uterus should tighten (contract) and become firm. This helps to stop bleeding in your uterus. To help your uterus contract and to control bleeding, your health care provider may: ? Give you medicine by injection, through an IV tube, by mouth, or through your rectum (rectally). ? Massage your abdomen or perform a vaginal exam to remove any blood clots that are left in your uterus. ? Empty your bladder by placing a thin, flexible tube (catheter) into your bladder. ? Encourage   you to breastfeed your baby. After labor is over, you and your baby will be monitored closely to ensure that you are both healthy until you are ready to go home. Your health care team will teach you how to care for yourself and your baby. This information is not intended to replace advice given to you by your health care provider. Make sure you discuss any questions you have with your health care provider. Document Released: 03/28/2008 Document Revised: 01/07/2016 Document Reviewed: 07/04/2015 Elsevier Interactive Patient Education  2018 [employer]  

## 2018-05-08 NOTE — Progress Notes (Signed)
    Routine Prenatal Care Visit  Subjective  Felicia Jones is a 22 y.o. G1P0 at [redacted]w[redacted]d being seen today for ongoing prenatal care.  She is currently monitored for the following issues for this low-risk pregnancy and has Bilateral low back pain without sciatica; Supervision of normal first pregnancy, antepartum; and Abdominal pain affecting pregnancy, antepartum on their problem list.  ----------------------------------------------------------------------------------- Patient reports backache and round ligament pain, Braxton Hicks contractions that are occasionally painful. Has an increase in vaginal discharge and occasional itching.   Contractions: Irregular. Vag. Bleeding: None.  Movement: Present. No leaking of fluid.  ----------------------------------------------------------------------------------- The following portions of the patient's history were reviewed and updated as appropriate: allergies, current medications, past family history, past medical history, past social history, past surgical history and problem list. Problem list updated.  Objective  Blood pressure 118/64, weight 204 lb (92.5 kg), last menstrual period 08/28/2017. Pregravid weight 175 lb (79.4 kg) Total Weight Gain 29 lb (13.2 kg) Body mass index is 39.84 kg/m.   Urinalysis: Protein Negative, Glucose Negative  Fetal Status: Fetal Heart Rate (bpm): 154 Fundal Height: 37 cm Movement: Present     General:  Alert, oriented and cooperative. Patient is in no acute distress.  Skin: Skin is warm and dry. No rash noted.   Cardiovascular: Normal heart rate noted  Respiratory: Normal respiratory effort, no problems with respiration noted  Abdomen: Soft, gravid, appropriate for gestational age. Pain/Pressure: Present     Pelvic:  Cervical exam performed        Extremities: Normal range of motion.  Edema: None  Mental Status: Normal mood and affect. Normal behavior. Normal judgment and thought content.    Assessment   22  y.o. G1P0 at [redacted]w[redacted]d, EDD 05/31/2018 Alternate EDD Entry presenting for a routine prenatal visit.  Plan   FIRST Problems (from 10/16/17 to present)    Problem Noted Resolved   Supervision of normal first pregnancy, antepartum 10/16/2017 by Oswaldo Conroy, CNM No   Overview Addendum 04/22/2018  7:37 PM by Farrel Conners, CNM    [facility]inic [facility] Prenatal Labs  Dating L=6 Blood type: B/Positive/-- (04/23 1023)   Genetic Screen Inheritest: negative NIPS: Normal Female Antibody:Negative (04/23 1023)  Anatomic [place] Female, complete 8/13 Rubella: 2.40 (04/23 1023) Varicella: Immune  GTT Early: 119              Third trimester:  RPR: Non Reactive (04/23 1023)   Rhogam N/A HBsAg: Negative (04/23 1023)   TDaP vaccine                       Flu Shot: HIV: Non Reactive (04/23 1023)   Baby Food                                GBS:   Contraception  Pap: N/A, less than 27 years old  CBB     Support Person               GBS and GC. Also ordered candida test with swab due to itching, though the cause may be a change in soap.  Preterm labor symptoms and general obstetric precautions were reviewed.  Please refer to After Visit Summary for other counseling recommendations.   Return in about 1 week (around 05/15/2018) for ROB and ultrasound.  Marcelyn Bruins, CNM 05/08/2018  12:15 PM

## 2018-05-09 LAB — CERVICOVAGINAL ANCILLARY ONLY
Candida vaginitis: POSITIVE — AB
Chlamydia: NEGATIVE
Neisseria Gonorrhea: NEGATIVE

## 2018-05-10 ENCOUNTER — Telehealth: Payer: Self-pay

## 2018-05-10 ENCOUNTER — Other Ambulatory Visit: Payer: Self-pay

## 2018-05-10 ENCOUNTER — Observation Stay
Admission: EM | Admit: 2018-05-10 | Discharge: 2018-05-10 | Disposition: A | Discharge status: Home / Self Care | Payer: Medicaid Other | Attending: Certified Nurse Midwife | Admitting: Certified Nurse Midwife

## 2018-05-10 ENCOUNTER — Encounter: Payer: Self-pay | Admitting: Certified Nurse Midwife

## 2018-05-10 ENCOUNTER — Encounter: Payer: Self-pay | Admitting: *Deleted

## 2018-05-10 ENCOUNTER — Observation Stay
Admission: EM | Admit: 2018-05-10 | Discharge: 2018-05-10 | Disposition: A | Discharge status: Home / Self Care | Payer: Medicaid Other | Source: Home / Self Care | Admitting: Obstetrics and Gynecology

## 2018-05-10 DIAGNOSIS — B3731 Acute candidiasis of vulva and vagina: Secondary | ICD-10-CM | POA: Diagnosis present

## 2018-05-10 DIAGNOSIS — O23593 Infection of other part of genital tract in pregnancy, third trimester: Secondary | ICD-10-CM | POA: Diagnosis not present

## 2018-05-10 DIAGNOSIS — O26893 Other specified pregnancy related conditions, third trimester: Secondary | ICD-10-CM

## 2018-05-10 DIAGNOSIS — Z3400 Encounter for supervision of normal first pregnancy, unspecified trimester: Secondary | ICD-10-CM

## 2018-05-10 DIAGNOSIS — N898 Other specified noninflammatory disorders of vagina: Secondary | ICD-10-CM | POA: Diagnosis present

## 2018-05-10 DIAGNOSIS — B373 Candidiasis of vulva and vagina: Secondary | ICD-10-CM | POA: Diagnosis not present

## 2018-05-10 DIAGNOSIS — Z3A37 37 weeks gestation of pregnancy: Secondary | ICD-10-CM | POA: Insufficient documentation

## 2018-05-10 LAB — URINALYSIS, COMPLETE (UACMP) WITH MICROSCOPIC
Bilirubin Urine: NEGATIVE
GLUCOSE, UA: NEGATIVE mg/dL
Hgb urine dipstick: NEGATIVE
Ketones, ur: NEGATIVE mg/dL
Leukocytes, UA: NEGATIVE
NITRITE: NEGATIVE
Protein, ur: NEGATIVE mg/dL
SPECIFIC GRAVITY, URINE: 1.012 (ref 1.005–1.030)
pH: 7 (ref 5.0–8.0)

## 2018-05-10 LAB — STREP GP B NAA: STREP GROUP B AG: NEGATIVE

## 2018-05-10 MED ORDER — FLUCONAZOLE 50 MG PO TABS
150.0000 mg | ORAL_TABLET | Freq: Once | ORAL | Status: AC
  Administered 2018-05-10: 150 mg via ORAL
  Filled 2018-05-10: qty 1

## 2018-05-10 NOTE — Final Progress Note (Signed)
Physician Final Progress Note  Patient ID: Felicia Jones MRN: [id number] DOB/AGE: October 07, 1995 22 y.o.  Admit date: 05/10/2018 Admitting provider: Vena Austria, MD/ Gas[facility] Lloyd. Sharen Hones, CNM Discharge date: 05/10/2018   Admission Diagnoses: IUP at 37 weeks with leakage of fluid from vagina  Discharge Diagnoses:  IUP at 37 weeks-no evidence of rupture of membranes Vaginal discharge   Consults: None  Significant Findings/ Diagnostic Studies: 22 year old G1 P0 with EDC=05/31/2018 at [redacted] weeks gestation returns to L&D with complaints of a trickle of fluid coming from vagina at 63 tonight. She was seen earlier today with similar complaints and was treated for a candidal vaginitis.  On sterile speculum exam there was a small amount of white discharge. Nitrazine and fern test were negative. Fetal heart rate baseline was 145 with accelerations to 170s, moderate variability. Contractions were every five minutes and mild. Cervix was closed/ 75%/-2. She was discharged home with labor precautions   Procedures: none  Discharge Condition: stable  Disposition: Discharge disposition: 01-Home or Self Care       Diet: Regular diet  Discharge Activity: Activity as tolerated  Discharge Instructions    Discharge patient   Complete by:  As directed    Discharge disposition:  01-Home or Self Care   Discharge patient date:  05/10/2018     Allergies as of 05/10/2018   No Known Allergies     Medication List    TAKE these medications   ferrous sulfate 325 (65 FE) MG tablet Take 1 tablet (325 mg total) by mouth daily with breakfast.   Prenatal Vitamin 27-0.8 MG Tabs Take 1 tablet by mouth daily.        Total time spent taking care of this patient: 15 minutes  Signed: Farrel Conners 05/10/2018, 10:23 PM

## 2018-05-10 NOTE — Discharge Instructions (Signed)
Vaginal Delivery Vaginal delivery means that you will give birth by pushing your baby out of your birth canal (vagina). A team of health care providers will help you before, during, and after vaginal delivery. Birth experiences are unique for every woman and every pregnancy, and birth experiences vary depending on where you choose to give birth. What should I do to prepare for my baby's birth? Before your baby is born, it is important to talk with your health care provider about:  Your labor and delivery preferences. These may include: ? Medicines that you may be given. ? How you will manage your pain. This might include non-medical pain relief techniques or injectable pain relief such as epidural analgesia. ? How you and your baby will be monitored during labor and delivery. ? Who may be in the labor and delivery room with you. ? Your feelings about surgical delivery of your baby (cesarean delivery, or C-section) if this becomes necessary. ? Your feelings about receiving donated blood through an IV tube (blood transfusion) if this becomes necessary.  Whether you are able: ? To take pictures or videos of the birth. ? To eat during labor and delivery. ? To move around, walk, or change positions during labor and delivery.  What to expect after your baby is born, such as: ? Whether delayed umbilical cord clamping and cutting is offered. ? Who will care for your baby right after birth. ? Medicines or tests that may be recommended for your baby. ? Whether breastfeeding is supported in your hospital or birth center. ? How long you will be in the hospital or birth center.  How any medical conditions you have may affect your baby or your labor and delivery experience.  To prepare for your baby's birth, you should also:  Attend all of your health care visits before delivery (prenatal visits) as recommended by your health care provider. This is important.  Prepare your home for your baby's  arrival. Make sure that you have: ? Diapers. ? Baby clothing. ? Feeding equipment. ? Safe sleeping arrangements for you and your baby.  Install a car seat in your vehicle. Have your car seat checked by a certified car seat installer to make sure that it is installed safely.  Think about who will help you with your new baby at home for at least the first several weeks after delivery.  What can I expect when I arrive at the birth center or hospital? Once you are in labor and have been admitted into the hospital or birth center, your health care provider may:  Review your pregnancy history and any concerns you have.  Insert an IV tube into one of your veins. This is used to give you fluids and medicines.  Check your blood pressure, pulse, temperature, and heart rate (vital signs).  Check whether your bag of water (amniotic sac) has broken (ruptured).  Talk with you about your birth plan and discuss pain control options.  Monitoring Your health care provider may monitor your contractions (uterine monitoring) and your baby's heart rate (fetal monitoring). You may need to be monitored:  Often, but not continuously (intermittently).  All the time or for long periods at a time (continuously). Continuous monitoring may be needed if: ? You are taking certain medicines, such as medicine to relieve pain or make your contractions stronger. ? You have pregnancy or labor complications.  Monitoring may be done by:  Placing a special stethoscope or a handheld monitoring device on your abdomen to   check your baby's heartbeat, and feeling your abdomen for contractions. This method of monitoring does not continuously record your baby's heartbeat or your contractions.  Placing monitors on your abdomen (external monitors) to record your baby's heartbeat and the frequency and length of contractions. You may not have to wear external monitors all the time.  Placing monitors inside of your uterus  (internal monitors) to record your baby's heartbeat and the frequency, length, and strength of your contractions. ? Your health care provider may use internal monitors if he or she needs more information about the strength of your contractions or your baby's heart rate. ? Internal monitors are put in place by passing a thin, flexible wire through your vagina and into your uterus. Depending on the type of monitor, it may remain in your uterus or on your baby's head until birth. ? Your health care provider will discuss the benefits and risks of internal monitoring with you and will ask for your permission before inserting the monitors.  Telemetry. This is a type of continuous monitoring that can be done with external or internal monitors. Instead of having to stay in bed, you are able to move around during telemetry. Ask your health care provider if telemetry is an option for you.  Physical exam Your health care provider may perform a physical exam. This may include:  Checking whether your baby is positioned: ? With the head toward your vagina (head-down). This is most common. ? With the head toward the top of your uterus (head-up or breech). If your baby is in a breech position, your health care provider may try to turn your baby to a head-down position so you can deliver vaginally. If it does not seem that your baby can be born vaginally, your provider may recommend surgery to deliver your baby. In rare cases, you may be able to deliver vaginally if your baby is head-up (breech delivery). ? Lying sideways (transverse). Babies that are lying sideways cannot be delivered vaginally.  Checking your cervix to determine: ? Whether it is thinning out (effacing). ? Whether it is opening up (dilating). ? How low your baby has moved into your birth canal.  What are the three stages of labor and delivery?  Normal labor and delivery is divided into the following three stages: Stage 1  Stage 1 is the  longest stage of labor, and it can last for hours or days. Stage 1 includes: ? Early labor. This is when contractions may be irregular, or regular and mild. Generally, early labor contractions are more than 10 minutes apart. ? Active labor. This is when contractions get longer, more regular, more frequent, and more intense. ? The transition phase. This is when contractions happen very close together, are very intense, and may last longer than during any other part of labor.  Contractions generally feel mild, infrequent, and irregular at first. They get stronger, more frequent (about every 2-3 minutes), and more regular as you progress from early labor through active labor and transition.  Many women progress through stage 1 naturally, but you may need help to continue making progress. If this happens, your health care provider may talk with you about: ? Rupturing your amniotic sac if it has not ruptured yet. ? Giving you medicine to help make your contractions stronger and more frequent.  Stage 1 ends when your cervix is completely dilated to 4 inches (10 cm) and completely effaced. This happens at the end of the transition phase. Stage 2  Once   your cervix is completely effaced and dilated to 4 inches (10 cm), you may start to feel an urge to push. It is common for the body to naturally take a rest before feeling the urge to push, especially if you received an epidural or certain other pain medicines. This rest period may last for up to 1-2 hours, depending on your unique labor experience.  During stage 2, contractions are generally less painful, because pushing helps relieve contraction pain. Instead of contraction pain, you may feel stretching and burning pain, especially when the widest part of your baby's head passes through the vaginal opening (crowning).  Your health care provider will closely monitor your pushing progress and your baby's progress through the vagina during stage 2.  Your  health care provider may massage the area of skin between your vaginal opening and anus (perineum) or apply warm compresses to your perineum. This helps it stretch as the baby's head starts to crown, which can help prevent perineal tearing. ? In some cases, an incision may be made in your perineum (episiotomy) to allow the baby to pass through the vaginal opening. An episiotomy helps to make the opening of the vagina larger to allow more room for the baby to fit through.  It is very important to breathe and focus so your health care provider can control the delivery of your baby's head. Your health care provider may have you decrease the intensity of your pushing, to help prevent perineal tearing.  After delivery of your baby's head, the shoulders and the rest of the body generally deliver very quickly and without difficulty.  Once your baby is delivered, the umbilical cord may be cut right away, or this may be delayed for 1-2 minutes, depending on your baby's health. This may vary among health care providers, hospitals, and birth centers.  If you and your baby are healthy enough, your baby may be placed on your chest or abdomen to help maintain the baby's temperature and to help you bond with each other. Some mothers and babies start breastfeeding at this time. Your health care team will dry your baby and help keep your baby warm during this time.  Your baby may need immediate care if he or she: ? Showed signs of distress during labor. ? Has a medical condition. ? Was born too early (prematurely). ? Had a bowel movement before birth (meconium). ? Shows signs of difficulty transitioning from being inside the uterus to being outside of the uterus. If you are planning to breastfeed, your health care team will help you begin a feeding. Stage 3  The third stage of labor starts immediately after the birth of your baby and ends after you deliver the placenta. The placenta is an organ that develops  during pregnancy to provide oxygen and nutrients to your baby in the womb.  Delivering the placenta may require some pushing, and you may have mild contractions. Breastfeeding can stimulate contractions to help you deliver the placenta.  After the placenta is delivered, your uterus should tighten (contract) and become firm. This helps to stop bleeding in your uterus. To help your uterus contract and to control bleeding, your health care provider may: ? Give you medicine by injection, through an IV tube, by mouth, or through your rectum (rectally). ? Massage your abdomen or perform a vaginal exam to remove any blood clots that are left in your uterus. ? Empty your bladder by placing a thin, flexible tube (catheter) into your bladder. ? Encourage   you to breastfeed your baby. After labor is over, you and your baby will be monitored closely to ensure that you are both healthy until you are ready to go home. Your health care team will teach you how to care for yourself and your baby. This information is not intended to replace advice given to you by your health care provider. Make sure you discuss any questions you have with your health care provider. Document Released: 03/28/2008 Document Revised: 01/07/2016 Document Reviewed: 07/04/2015 Elsevier Interactive Patient Education  2018 [employer]  

## 2018-05-10 NOTE — Final Progress Note (Addendum)
Physician Final Progress Note  Patient ID: Felicia Jones MRN: [id number] DOB/AGE: July 26, 1995 22 y.o.  Admit date: 05/10/2018 Admitting provider: Vena Austria, MD/ Trinna Balloon, CNM Discharge date: 05/10/2018   Admission Diagnoses: IUP at 37 weeks with vaginal discharge Lumbar sacral back pain  Discharge Diagnoses:  IUP at 37 weeks No evidence of SROM Candida vaginitis  Consults: None  Significant Findings/ Diagnostic Studies: 22 year old G1 P0 with EDC=05/31/2018 presented at [redacted] weeks gestation with complaints of feeling wet all the time x 2 days. No gush of fluid. Has not had to wear a pad. The discharge appears clear on her underwear. No vulvar itching but vulvovaginal area a little more tender. Had cultures done 2 days ago and they just returned positive for Candida. GC/Chlamydia and GBS were both negative. Also complains of right lumbar sacral back pain/ aching No dysuria. Baby active. Prenatal care at [facility] remarkable for obesity and the following [facility] Prenatal Labs  Dating L=6 Blood type: B/Positive/-- (04/23 1023)   Genetic Screen Inheritest: negative NIPS: Normal Female Antibody:Negative (04/23 1023)  Anatomic [place] Female, complete 8/13 Rubella: 2.40 (04/23 1023) Varicella: Immune  GTT Early: 119              Third trimester:  RPR: Non Reactive (04/23 1023)   Rhogam N/A HBsAg: Negative (04/23 1023)   TDaP vaccine        03/27/18               Flu Shot: HIV: Non Reactive (04/23 1023)   Baby Food         Breast                       GBS: negative  Contraception minipill Pap: N/A, less than 12 years old  CBB     Support Person        Exam: General: BF in NAD Vital signs: Ht 5' (1.524 m)   Wt 92.5 kg   LMP 08/28/2017 (Approximate)   BMI 39.84 kg/m  115/74 Pulse 108 Abdomen: cephalic presentation, soft and nontender uterus Back: tenderness in right SI joint area Pelvic: External/ BUS: erythema and tenderness to introitus. Vulva and vestibule  dry  Vagina: white discharge  Nitrazine negative, fern negative FHR: baseline 150 with accelerations to 170s to 180, moderate variability Toco: occasional mild contraction  A: IUP at 37 weeks with no evidence of SROM Candida vaginitis FWB-Cat 1 tracing  P: Diflucan 150 mgm x 1 Labor precautions Follow up as scheduled at [facility] for ROB  Procedures: none  Discharge Condition: stable  Disposition: Discharge disposition: 01-Home or Self Care     home  Diet: Regular diet  Discharge Activity: Activity as tolerated  Discharge Instructions    Discharge patient   Complete by:  As directed    Discharge disposition:  01-Home or Self Care   Discharge patient date:  05/10/2018     Allergies as of 05/10/2018   No Known Allergies     Medication List    TAKE these medications   ferrous sulfate 325 (65 FE) MG tablet Take 1 tablet (325 mg total) by mouth daily with breakfast.   Prenatal Vitamin 27-0.8 MG Tabs Take 1 tablet by mouth daily.        Total time spent taking care of this patient: 20 minutes  Signed: Farrel Conners 05/10/2018, 10:37 PM

## 2018-05-10 NOTE — OB Triage Note (Signed)
Reports leaking fluid since Tuesday afternoon. Denies gush of fluid. Denies vaginal bleeding. Fluid with no color or odor. Reports good fetal movement. Denies urinary issues. CO right flank pain when palpated, since Wednesday. Elaina Hoops

## 2018-05-10 NOTE — Progress Notes (Signed)
Discharge home with discharge instructions. Questions answered. Left floor ambulatory with friend. Elaina Hoops

## 2018-05-10 NOTE — OB Triage Note (Signed)
Pt states she continues have LOF down her legs. Pt was here a few hours ago and sent home. Pt denies ctxs or vaginal bleeding. Pt states + FM.

## 2018-05-10 NOTE — Telephone Encounter (Signed)
Pt unable to stay dry and is 37wks.  Adv to go to L&D via ED as we do not have appt availability.  L&D notified.

## 2018-05-12 ENCOUNTER — Other Ambulatory Visit: Payer: Self-pay

## 2018-05-12 ENCOUNTER — Observation Stay
Admission: EM | Admit: 2018-05-12 | Discharge: 2018-05-12 | Disposition: A | Discharge status: Home / Self Care | Payer: Medicaid Other | Attending: Obstetrics and Gynecology | Admitting: Obstetrics and Gynecology

## 2018-05-12 DIAGNOSIS — M533 Sacrococcygeal disorders, not elsewhere classified: Secondary | ICD-10-CM | POA: Insufficient documentation

## 2018-05-12 DIAGNOSIS — M545 Low back pain: Secondary | ICD-10-CM | POA: Diagnosis not present

## 2018-05-12 DIAGNOSIS — O4292 Full-term premature rupture of membranes, unspecified as to length of time between rupture and onset of labor: Secondary | ICD-10-CM | POA: Diagnosis present

## 2018-05-12 DIAGNOSIS — O26893 Other specified pregnancy related conditions, third trimester: Secondary | ICD-10-CM | POA: Insufficient documentation

## 2018-05-12 DIAGNOSIS — Z3400 Encounter for supervision of normal first pregnancy, unspecified trimester: Secondary | ICD-10-CM

## 2018-05-12 NOTE — Discharge Summary (Signed)
Physician Final Progress Note  Patient ID: Felicia Jones MRN: [id number] DOB/AGE: [birth year] 22 y.o.  Admit date: 05/12/2018 Admitting provider: Natale Milch, MD Discharge date: 05/12/2018   Admission Diagnoses: IUP at 37 weeks, fluid leaking, lumbar sacral back pain  Discharge Diagnoses:  Active Problems:   Indication for care in labor and delivery, antepartum IUP at 37 weeks, no evidence of SROM  History of Present Illness: The patient is a 22 y.o. female G1P0 at [redacted]w[redacted]d who presents for feeling wet with leaking fluid all the time for the past 4 days. She denies a gush of fluid. She has been wearing a pad which feels wet. She denies any itching, irritation or dysuria at this time. She does admit intercourse last night. She also has had ongoing low back pain/lumbar/sacral.   Past Medical History:  Diagnosis Date  . Arthritis    in back  . Migraines     Past Surgical History:  Procedure Laterality Date  . WISDOM TOOTH EXTRACTION      No current facility-administered medications on file prior to encounter.    Current Outpatient Medications on File Prior to Encounter  Medication Sig Dispense Refill  . Prenatal Vit-Fe Fumarate-FA (PRENATAL VITAMIN) 27-0.8 MG TABS Take 1 tablet by mouth daily.    . ferrous sulfate (FERROUSUL) 325 (65 FE) MG tablet Take 1 tablet (325 mg total) by mouth daily with breakfast. (Patient not taking: Reported on 05/08/2018) 30 tablet 4    No Known Allergies  Social History   Socioeconomic History  . Marital status: Single    Spouse name: Not on file  . Number of children: Not on file  . Years of education: 69  . Highest education level: Not on file  Occupational History  . Occupation: Agricultural engineer    Comment: [employer]  Social Needs  . Financial resource strain: Not on file  . Food insecurity:    Worry: Not on file    Inability: Not on file  . Transportation needs:    Medical: Not on file    Non-medical: Not on file  Tobacco  Use  . Smoking status: Never Smoker  . Smokeless tobacco: Never Used  Substance and Sexual Activity  . Alcohol use: No    Frequency: Never  . Drug use: No  . Sexual activity: Yes    Birth control/protection: None  Lifestyle  . Physical activity:    Days per week: Not on file    Minutes per session: Not on file  . Stress: Not on file  Relationships  . Social connections:    Talks on phone: Not on file    Gets together: Not on file    Attends religious service: Not on file    Active member of club or organization: Not on file    Attends meetings of clubs or organizations: Not on file    Relationship status: Not on file  . Intimate partner violence:    Fear of current or ex partner: Not on file    Emotionally abused: Not on file    Physically abused: Not on file    Forced sexual activity: Not on file  Other Topics Concern  . Not on file  Social History Narrative  . Not on file    Family History  Problem Relation Age of Onset  . Migraines Mother   . Diabetes Maternal Grandmother   . Hypertension Maternal Grandmother   . Diabetes Maternal Grandfather   . Hypertension Maternal Grandfather  Review of Systems  Constitutional: Negative.   HENT: Negative.   Eyes: Negative.   Respiratory: Negative.   Cardiovascular: Negative.   Gastrointestinal: Negative.   Genitourinary:       Leaking fluid  Musculoskeletal:       Low back pain  Skin: Negative.   Neurological: Negative.   Endo/Heme/Allergies: Negative.   Psychiatric/Behavioral: Negative.      Physical Exam: BP 115/62 (BP Location: Left Arm)   Pulse 81   Temp 98.2 F (36.8 C)   Resp 18   Ht 5' (1.524 m)   Wt 92.5 kg   LMP 08/28/2017 (Approximate)   SpO2 100%   BMI 39.84 kg/m   Physical Exam  Constitutional: She is oriented to person, place, and time. She appears well-developed and well-nourished.  Genitourinary: Vagina normal.  Genitourinary Comments: Scant thin white fluid in vagina, cervix appears  to be closed  HENT:  Head: Normocephalic.  Cardiovascular: Normal rate and regular rhythm.  Pulmonary/Chest: Effort normal and breath sounds normal.  Abdominal: Soft.  Neurological: She is alert and oriented to person, place, and time.  Skin: Skin is warm and dry.  Psychiatric: She has a normal mood and affect.   Toco: negative for contractions Fetal well being: 140 bpm, moderate variability, +accelerations, -decelerations  Consults: None  Significant Findings/ Diagnostic Studies:  Sterile speculum exam: Pooling negative, nitrazine pH is 8 which corresponds to the pH of semen  Procedures: NST  Hospital Course: The patient was admitted to Labor and Delivery Triage for observation.   Discharge Condition: good  Disposition: Discharge disposition: 01-Home or Self Care       Diet: Regular diet  Discharge Activity: Activity as tolerated  Discharge Instructions    Discharge activity:  No Restrictions   Complete by:  As directed    Discharge diet:  No restrictions   Complete by:  As directed    Fetal Kick Count:  Lie on our left side for one hour after a meal, and count the number of times your baby kicks.  If it is less than 5 times, get up, move around and drink some juice.  Repeat the test 30 minutes later.  If it is still less than 5 kicks in an hour, notify your doctor.   Complete by:  As directed    LABOR:  When conractions begin, you should start to time them from the beginning of one contraction to the beginning  of the next.  When contractions are 5 - 10 minutes apart or less and have been regular for at least an hour, you should call your health care provider.   Complete by:  As directed    No sexual activity restrictions   Complete by:  As directed    Notify physician for bleeding from the vagina   Complete by:  As directed    Notify physician for blurring of vision or spots before the eyes   Complete by:  As directed    Notify physician for chills or fever    Complete by:  As directed    Notify physician for fainting spells, "black outs" or loss of consciousness   Complete by:  As directed    Notify physician for increase in vaginal discharge   Complete by:  As directed    Notify physician for leaking of fluid   Complete by:  As directed    Notify physician for pain or burning when urinating   Complete by:  As directed  Notify physician for pelvic pressure (sudden increase)   Complete by:  As directed    Notify physician for severe or continued nausea or vomiting   Complete by:  As directed    Notify physician for sudden gushing of fluid from the vagina (with or without continued leaking)   Complete by:  As directed    Notify physician for sudden, constant, or occasional abdominal pain   Complete by:  As directed    Notify physician if baby moving less than usual   Complete by:  As directed      Allergies as of 05/12/2018   No Known Allergies     Medication List    STOP taking these medications   ferrous sulfate 325 (65 FE) MG tablet     TAKE these medications   Prenatal Vitamin 27-0.8 MG Tabs Take 1 tablet by mouth daily.      Follow-up Information    [employer] Go to.   Specialty:  Obstetrics and Gynecology Why:  regular scheduled prenatal appointment Contact information: [address] [place] [address] [phone]          Total time spent taking care of this patient: 15 minutes  Signed: Tresea Mall, CNM  05/12/2018, 4:15 PM

## 2018-05-12 NOTE — Discharge Summary (Signed)
RN reviewed discharge instructions with patient. Gave patient opportunity for questions. All questions answered at this time. Pt verbalized understanding. Pt discharged home. 

## 2018-05-12 NOTE — OB Triage Note (Addendum)
Pt presents c/o LOF for a week. Pt states she was here a couple days ago and came in twice for leaking of fluid and it has persisted. Most recent intercourse was last night. Nitrazine negative.  Pt denies any bleeding. Reports positive fetal movement. Vitals WNL. Will continue to monitor.

## 2018-05-14 ENCOUNTER — Encounter: Payer: Self-pay | Admitting: Obstetrics and Gynecology

## 2018-05-14 ENCOUNTER — Ambulatory Visit (INDEPENDENT_AMBULATORY_CARE_PROVIDER_SITE_OTHER): Payer: Medicaid Other

## 2018-05-14 ENCOUNTER — Ambulatory Visit (INDEPENDENT_AMBULATORY_CARE_PROVIDER_SITE_OTHER): Payer: Medicaid Other | Admitting: Obstetrics and Gynecology

## 2018-05-14 VITALS — BP 104/50 | Wt 205.0 lb

## 2018-05-14 DIAGNOSIS — Z362 Encounter for other antenatal screening follow-up: Secondary | ICD-10-CM | POA: Diagnosis not present

## 2018-05-14 DIAGNOSIS — O26893 Other specified pregnancy related conditions, third trimester: Secondary | ICD-10-CM

## 2018-05-14 DIAGNOSIS — Z3A37 37 weeks gestation of pregnancy: Secondary | ICD-10-CM

## 2018-05-14 DIAGNOSIS — Z3400 Encounter for supervision of normal first pregnancy, unspecified trimester: Secondary | ICD-10-CM

## 2018-05-14 DIAGNOSIS — R81 Glycosuria: Secondary | ICD-10-CM

## 2018-05-14 LAB — POCT URINALYSIS DIPSTICK OB: PROTEIN: NEGATIVE

## 2018-05-14 NOTE — Progress Notes (Signed)
ROB/US C/o mucus plug released, having diarrhea Declines flu shot Desired cervical check

## 2018-05-14 NOTE — Addendum Note (Signed)
Addended by: Adelene IdlerSCHUMAN, Radwan Cowley on: 05/14/2018 10:48 AM   Modules accepted: Orders

## 2018-05-14 NOTE — Progress Notes (Addendum)
Routine Prenatal Care Visit  Subjective  Felicia Jones is a 22 y.o. G1P0 at [redacted]w[redacted]d being seen today for ongoing prenatal care.  She is currently monitored for the following issues for this low-risk pregnancy and has Bilateral low back pain without sciatica; Supervision of normal first pregnancy, antepartum; Abdominal pain affecting pregnancy, antepartum; Vaginal discharge; Candida vaginitis; and Indication for care in labor and delivery, antepartum on their problem list.  ----------------------------------------------------------------------------------- Patient reports irregular contractions and loss of mucus plug.   Contractions: Irregular. Vag. Bleeding: Scant.  Movement: Present. Denies leaking of fluid.  ----------------------------------------------------------------------------------- The following portions of the patient's history were reviewed and updated as appropriate: allergies, current medications, past family history, past medical history, past social history, past surgical history and problem list. Problem list updated.   Objective  Blood pressure (!) 104/50, weight 205 lb (93 kg), last menstrual period 08/28/2017. Pregravid weight 175 lb (79.4 kg) Total Weight Gain 30 lb (13.6 kg) Urinalysis:      Fetal Status: Fetal Heart Rate (bpm): 150 Fundal Height: 40 cm Movement: Present  Presentation: Vertex  General:  Alert, oriented and cooperative. Patient is in no acute distress.  Skin: Skin is warm and dry. No rash noted.   Cardiovascular: Normal heart rate noted  Respiratory: Normal respiratory effort, no problems with respiration noted  Abdomen: Soft, gravid, appropriate for gestational age. Pain/Pressure: Present     Pelvic:  Cervical exam performed Dilation: 1 Effacement (%): 70 Station: -3  Extremities: Normal range of motion.     Mental Status: Normal mood and affect. Normal behavior. Normal judgment and thought content.     Assessment   22 y.o. G1P0 at [redacted]w[redacted]d by   05/31/2018, Alternate EDD Entry presenting for routine prenatal visit  Plan   FIRST Problems (from 10/16/17 to present)    Problem Noted Resolved   Supervision of normal first pregnancy, antepartum 10/16/2017 by Oswaldo Conroy, CNM No   Overview Addendum 05/14/2018 10:18 AM by Natale Milch, MD    [facility] Prenatal Labs  Dating L=6 Blood type: B/Positive/-- (04/23 1023)   Genetic Screen Inheritest: negative NIPS: Normal Female Antibody:Negative (04/23 1023)  Anatomic [place] Female, complete 8/13 Rubella: 2.40 (04/23 1023) Varicella: Immune  GTT Early: 119               Third trimester: 169, 3hr WNL RPR: Non Reactive (04/23 1023)   Rhogam N/A HBsAg: Negative (04/23 1023)   TDaP vaccine                        Flu Shot: Declines HIV: Non Reactive (04/23 1023)   Baby Food    Breast                            GBS:  Negative  Contraception  POP then Patch when able Pap: N/A, less than 58 years old  CBB     Support Person                  Gestational age appropriate obstetric precautions including but not limited to vaginal bleeding, contractions, leaking of fluid and fetal movement were reviewed in detail with the patient.    Patient reports eating sweets and lasagna last night for dinner, she also woke up and ate cake at 5-6 am. Finger stick glucose: 111  Discussed that given her family history and eating habits she is at risk of developing  diabetes. Discussed healthy diet and exercise and regularly screening for diabetes every 3 years.   Return in about 1 week (around 05/21/2018) for ROB.  Natale Milchhristanna R Alexxia Stankiewicz MD [facility], [facility] 05/14/2018, 10:31 AM

## 2018-05-15 ENCOUNTER — Inpatient Hospital Stay
Admission: EM | Admit: 2018-05-15 | Discharge: 2018-05-18 | DRG: 807 | Disposition: A | Discharge status: Home / Self Care | Payer: Medicaid Other | Attending: Certified Nurse Midwife | Admitting: Certified Nurse Midwife

## 2018-05-15 ENCOUNTER — Other Ambulatory Visit: Payer: Medicaid Other

## 2018-05-15 ENCOUNTER — Other Ambulatory Visit: Payer: Self-pay

## 2018-05-15 ENCOUNTER — Encounter: Payer: Medicaid Other | Admitting: Advanced Practice Midwife

## 2018-05-15 DIAGNOSIS — E669 Obesity, unspecified: Secondary | ICD-10-CM | POA: Diagnosis present

## 2018-05-15 DIAGNOSIS — O99214 Obesity complicating childbirth: Principal | ICD-10-CM | POA: Diagnosis present

## 2018-05-15 DIAGNOSIS — Z3A37 37 weeks gestation of pregnancy: Secondary | ICD-10-CM

## 2018-05-15 NOTE — OB Triage Note (Signed)
Pt presents from ED with complaints of CTX every 5-6 minutes starting an hour ago. Denies leaking of fluid, bleeding N/V/D or intercourse in the last 24 hours. Endorses positive fetal movement.

## 2018-05-16 ENCOUNTER — Inpatient Hospital Stay: Payer: Medicaid Other | Admitting: Anesthesiology

## 2018-05-16 DIAGNOSIS — Z3483 Encounter for supervision of other normal pregnancy, third trimester: Secondary | ICD-10-CM | POA: Diagnosis present

## 2018-05-16 DIAGNOSIS — Z3A37 37 weeks gestation of pregnancy: Secondary | ICD-10-CM

## 2018-05-16 DIAGNOSIS — O99214 Obesity complicating childbirth: Secondary | ICD-10-CM | POA: Diagnosis present

## 2018-05-16 DIAGNOSIS — E669 Obesity, unspecified: Secondary | ICD-10-CM | POA: Diagnosis present

## 2018-05-16 LAB — CBC
HCT: 39.7 % (ref 36.0–46.0)
Hemoglobin: 12.9 g/dL (ref 12.0–15.0)
MCH: 26.9 pg (ref 26.0–34.0)
MCHC: 32.5 g/dL (ref 30.0–36.0)
MCV: 82.9 fL (ref 80.0–100.0)
NRBC: 0 % (ref 0.0–0.2)
PLATELETS: 301 10*3/uL (ref 150–400)
RBC: 4.79 MIL/uL (ref 3.87–5.11)
RDW: 14.6 % (ref 11.5–15.5)
WBC: 19.7 10*3/uL — ABNORMAL HIGH (ref 4.0–10.5)

## 2018-05-16 LAB — TYPE AND SCREEN
ABO/RH(D): B POS
Antibody Screen: NEGATIVE

## 2018-05-16 MED ORDER — METHYLERGONOVINE MALEATE 0.2 MG/ML IJ SOLN
INTRAMUSCULAR | Status: AC
  Administered 2018-05-16: 0.2 mg via INTRAMUSCULAR
  Filled 2018-05-16: qty 1

## 2018-05-16 MED ORDER — LACTATED RINGERS IV SOLN
500.0000 mL | INTRAVENOUS | Status: DC | PRN
  Administered 2018-05-16: 500 mL via INTRAVENOUS

## 2018-05-16 MED ORDER — FENTANYL 2.5 MCG/ML W/ROPIVACAINE 0.15% IN NS 100 ML EPIDURAL (ARMC)
EPIDURAL | Status: DC | PRN
  Administered 2018-05-16: 12 mL/h via EPIDURAL

## 2018-05-16 MED ORDER — ONDANSETRON HCL 4 MG PO TABS: 4.0000 mg | ORAL_TABLET | ORAL | Status: DC | PRN

## 2018-05-16 MED ORDER — OXYTOCIN BOLUS FROM INFUSION
500.0000 mL | Freq: Once | INTRAVENOUS | Status: AC
  Administered 2018-05-16: 500 mL via INTRAVENOUS

## 2018-05-16 MED ORDER — AMMONIA AROMATIC IN INHA
RESPIRATORY_TRACT | Status: AC
  Filled 2018-05-16: qty 10

## 2018-05-16 MED ORDER — HYDROCODONE-ACETAMINOPHEN 5-325 MG PO TABS
ORAL_TABLET | ORAL | Status: AC
  Administered 2018-05-16: 1 via ORAL
  Filled 2018-05-16: qty 1

## 2018-05-16 MED ORDER — WITCH HAZEL-GLYCERIN EX PADS: 1.0000 "application " | MEDICATED_PAD | CUTANEOUS | Status: DC | PRN

## 2018-05-16 MED ORDER — BENZOCAINE-MENTHOL 20-0.5 % EX AERO
1.0000 "application " | INHALATION_SPRAY | CUTANEOUS | Status: DC | PRN
  Administered 2018-05-16: 1 via TOPICAL
  Filled 2018-05-16: qty 56

## 2018-05-16 MED ORDER — LACTATED RINGERS IV SOLN
500.0000 mL | Freq: Once | INTRAVENOUS | Status: AC
  Administered 2018-05-16: 500 mL via INTRAVENOUS

## 2018-05-16 MED ORDER — OXYTOCIN 10 UNIT/ML IJ SOLN
INTRAMUSCULAR | Status: AC
  Filled 2018-05-16: qty 2

## 2018-05-16 MED ORDER — TERBUTALINE SULFATE 1 MG/ML IJ SOLN
INTRAMUSCULAR | Status: AC
  Administered 2018-05-16: 0.25 mg
  Filled 2018-05-16: qty 1

## 2018-05-16 MED ORDER — EPHEDRINE 5 MG/ML INJ
10.0000 mg | INTRAVENOUS | Status: DC | PRN
  Filled 2018-05-16: qty 2

## 2018-05-16 MED ORDER — SIMETHICONE 80 MG PO CHEW: 80.0000 mg | CHEWABLE_TABLET | ORAL | Status: DC | PRN

## 2018-05-16 MED ORDER — LIDOCAINE HCL (PF) 1 % IJ SOLN
INTRAMUSCULAR | Status: AC
  Filled 2018-05-16: qty 30

## 2018-05-16 MED ORDER — FERROUS SULFATE 325 (65 FE) MG PO TABS
325.0000 mg | ORAL_TABLET | Freq: Two times a day (BID) | ORAL | Status: DC
  Administered 2018-05-16 – 2018-05-18 (×4): 325 mg via ORAL
  Filled 2018-05-16 (×4): qty 1

## 2018-05-16 MED ORDER — IBUPROFEN 600 MG PO TABS
600.0000 mg | ORAL_TABLET | Freq: Four times a day (QID) | ORAL | Status: DC
  Administered 2018-05-16 – 2018-05-18 (×8): 600 mg via ORAL
  Filled 2018-05-16 (×7): qty 1

## 2018-05-16 MED ORDER — DIBUCAINE 1 % RE OINT: 1.0000 "application " | TOPICAL_OINTMENT | RECTAL | Status: DC | PRN

## 2018-05-16 MED ORDER — HYDROCODONE-ACETAMINOPHEN 5-325 MG PO TABS
1.0000 | ORAL_TABLET | ORAL | Status: DC | PRN
  Administered 2018-05-16 – 2018-05-18 (×4): 1 via ORAL
  Filled 2018-05-16 (×3): qty 1

## 2018-05-16 MED ORDER — PRENATAL MULTIVITAMIN CH
1.0000 | ORAL_TABLET | Freq: Every day | ORAL | Status: DC
  Administered 2018-05-17 – 2018-05-18 (×2): 1 via ORAL
  Filled 2018-05-16 (×2): qty 1

## 2018-05-16 MED ORDER — PHENYLEPHRINE 40 MCG/ML (10ML) SYRINGE FOR IV PUSH (FOR BLOOD PRESSURE SUPPORT)
80.0000 ug | PREFILLED_SYRINGE | INTRAVENOUS | Status: DC | PRN
  Filled 2018-05-16: qty 5

## 2018-05-16 MED ORDER — ONDANSETRON HCL 4 MG/2ML IJ SOLN: 4.0000 mg | Freq: Four times a day (QID) | INTRAMUSCULAR | Status: DC | PRN

## 2018-05-16 MED ORDER — DIPHENHYDRAMINE HCL 25 MG PO CAPS: 25.0000 mg | ORAL_CAPSULE | Freq: Four times a day (QID) | ORAL | Status: DC | PRN

## 2018-05-16 MED ORDER — SENNOSIDES-DOCUSATE SODIUM 8.6-50 MG PO TABS
2.0000 | ORAL_TABLET | ORAL | Status: DC
  Administered 2018-05-17: 2 via ORAL
  Filled 2018-05-16: qty 2

## 2018-05-16 MED ORDER — MISOPROSTOL 200 MCG PO TABS
800.0000 ug | ORAL_TABLET | Freq: Once | ORAL | Status: AC | PRN
  Administered 2018-05-16: 800 ug via RECTAL

## 2018-05-16 MED ORDER — COCONUT OIL OIL
1.0000 "application " | TOPICAL_OIL | Status: DC | PRN
  Administered 2018-05-16: 1 via TOPICAL
  Filled 2018-05-16: qty 120

## 2018-05-16 MED ORDER — OXYTOCIN 40 UNITS IN LACTATED RINGERS INFUSION - SIMPLE MED
2.5000 [IU]/h | INTRAVENOUS | Status: DC
  Administered 2018-05-16: 2.5 [IU]/h via INTRAVENOUS
  Filled 2018-05-16 (×2): qty 1000

## 2018-05-16 MED ORDER — IBUPROFEN 600 MG PO TABS
ORAL_TABLET | ORAL | Status: AC
  Administered 2018-05-16: 600 mg via ORAL
  Filled 2018-05-16: qty 1

## 2018-05-16 MED ORDER — LIDOCAINE-EPINEPHRINE (PF) 1.5 %-1:200000 IJ SOLN
INTRAMUSCULAR | Status: DC | PRN
  Administered 2018-05-16: 3 mL via EPIDURAL

## 2018-05-16 MED ORDER — DIPHENHYDRAMINE HCL 50 MG/ML IJ SOLN: 12.5000 mg | INTRAMUSCULAR | Status: DC | PRN

## 2018-05-16 MED ORDER — AMMONIA AROMATIC IN INHA: 0.3000 mL | Freq: Once | RESPIRATORY_TRACT | Status: DC | PRN

## 2018-05-16 MED ORDER — FENTANYL 2.5 MCG/ML W/ROPIVACAINE 0.15% IN NS 100 ML EPIDURAL (ARMC)
12.0000 mL/h | EPIDURAL | Status: DC
  Administered 2018-05-16: 12 mL/h via EPIDURAL
  Filled 2018-05-16: qty 100

## 2018-05-16 MED ORDER — LIDOCAINE HCL (PF) 1 % IJ SOLN
30.0000 mL | INTRAMUSCULAR | Status: AC | PRN
  Administered 2018-05-16: 1.2 mL via SUBCUTANEOUS

## 2018-05-16 MED ORDER — FENTANYL 2.5 MCG/ML W/ROPIVACAINE 0.15% IN NS 100 ML EPIDURAL (ARMC)
EPIDURAL | Status: AC
  Filled 2018-05-16: qty 100

## 2018-05-16 MED ORDER — METHYLERGONOVINE MALEATE 0.2 MG PO TABS
0.2000 mg | ORAL_TABLET | Freq: Three times a day (TID) | ORAL | Status: DC
  Administered 2018-05-16 – 2018-05-17 (×2): 0.2 mg via ORAL
  Filled 2018-05-16 (×2): qty 1

## 2018-05-16 MED ORDER — ONDANSETRON HCL 4 MG/2ML IJ SOLN: 4.0000 mg | INTRAMUSCULAR | Status: DC | PRN

## 2018-05-16 MED ORDER — MISOPROSTOL 200 MCG PO TABS
ORAL_TABLET | ORAL | Status: AC
  Administered 2018-05-16: 800 ug via RECTAL
  Filled 2018-05-16: qty 4

## 2018-05-16 MED ORDER — ACETAMINOPHEN 325 MG PO TABS
650.0000 mg | ORAL_TABLET | ORAL | Status: DC | PRN
  Administered 2018-05-17 (×2): 650 mg via ORAL
  Filled 2018-05-16 (×2): qty 2

## 2018-05-16 MED ORDER — SOD CITRATE-CITRIC ACID 500-334 MG/5ML PO SOLN
ORAL | Status: AC
  Filled 2018-05-16: qty 15

## 2018-05-16 MED ORDER — BUTORPHANOL TARTRATE 1 MG/ML IJ SOLN
1.0000 mg | INTRAMUSCULAR | Status: DC | PRN
  Administered 2018-05-16: 1 mg via INTRAVENOUS
  Filled 2018-05-16 (×2): qty 1

## 2018-05-16 MED ORDER — LACTATED RINGERS IV SOLN
INTRAVENOUS | Status: DC
  Administered 2018-05-16 (×2): via INTRAVENOUS

## 2018-05-16 NOTE — Discharge Summary (Signed)
OB Discharge Summary     Patient Name: Felicia Jones DOB: 09/15/1995 MRN: [id number]  Date of admission: 05/15/2018 Delivering MD: Felicia MohairStephen Jackson, MD  Date of Delivery: 05/16/2018  Date of discharge: 05/18/2018  Admitting diagnosis: Labor and delivery Intrauterine pregnancy: 6768w2d     Secondary diagnosis: None     Discharge diagnosis: Term Pregnancy Delivered                                                                                                Post partum procedures:None  Augmentation: none  Complications: None  Hospital course:  Onset of Labor With Vaginal Delivery     22 y.o. yo G1P0 at 4068w2d was admitted in Active Labor on 05/15/2018. Patient had an labor course as follows:  Membrane Rupture Time/Date: 3:56 AM ,05/16/2018   Intrapartum Procedures: Episiotomy: None [1]                                         Lacerations:  1st degree [2];Vaginal [6]  Patient had a delivery of a Viable infant. 05/16/2018  Information for the patient's newborn:  Felicia Jones, Boy Felicia Jones [[id number]][[id number]]  Delivery Method: Vag-Spont   Medications were given for larger than expected blood loss at delivery, after which bleeding was within normal limits. Pateint had an otherwise uncomplicated postpartum course.  She is ambulating, tolerating a regular diet, passing flatus, and urinating well. Patient is discharged home in stable condition on 05/18/18.  Physical exam  Vitals:   05/17/18 0729 05/17/18 1545 05/17/18 2359 05/18/18 0739  BP: 106/65 (!) 106/54 117/70 114/65  Pulse: 69 77 75 82  Resp: 18 18 18 20   Temp: 97.9 F (36.6 C) 98.1 F (36.7 C) (!) 97.4 F (36.3 C) 97.9 F (36.6 C)  TempSrc: Oral Oral Oral Oral  SpO2: 99% 100% 92% 100%  Weight:      Height:       General: alert, cooperative and no distress Lochia: appropriate Uterine Fundus: firm Incision: N/A DVT Evaluation: No evidence of DVT seen on physical exam.  Labs: Lab Results  Component Value Date   WBC 25.0 (H) 05/17/2018    HGB 11.2 (L) 05/17/2018   HCT 33.8 (L) 05/17/2018   MCV 82.8 05/17/2018   PLT 225 05/17/2018    Discharge instruction: per After Visit Summary.  Medications:  Allergies as of 05/18/2018   No Known Allergies     Medication List    TAKE these medications   Prenatal Vitamin 27-0.8 MG Tabs Take 1 tablet by mouth daily.       Diet: routine diet  Activity: Advance as tolerated. Pelvic rest for 6 weeks.   Outpatient follow up: Follow-up Information    Conard NovakJackson, Felicia D, MD. Schedule an appointment as soon as possible for a visit in 6 week(s).   Specialty:  Obstetrics and Gynecology Why:  Routine postpartum follow up Contact information: [address] [place] [address] [phone]             Postpartum contraception: Progesterone  only pills and combined patch Rhogam Given postpartum: no Rubella vaccine given postpartum: no Varicella vaccine given postpartum: no TDaP given antepartum or postpartum: 03/27/18   Newborn Data: Live born female: Felicia Jones Birth Weight: 6 lb 15.5 oz APGAR: 9, 9  Newborn Delivery   Birth date/time:  05/16/2018 13:40:00 Delivery type:  Vaginal, Spontaneous      Baby Feeding: Breast  Disposition: home with mother  SIGNED: Marcelyn Jones, CNM 05/18/2018  9:52 AM

## 2018-05-16 NOTE — H&P (Addendum)
OB History & Physical   History of Present Illness:  Chief Complaint:   HPI:  Felicia Jones is a 22 y.o. G1P0 female with EDC=06/04/2018 at 5918w2d dated by LMP c/w 6wk5d ultrasound.  Her pregnancy has been mildly complicated by obesity and an elevated one hour GTT (169) with a normal 3 hour GTT. Marland Kitchen.  She presents to L&D for evaluation of labor.  She started having strong regular contractions every 5-6 minutes apart around 7:30 PM last night. Denies bleeding or leakage of fluid.  Prenatal care site: [facility] has been remarkable for  a total weight gain of 30# and her current BMI=40 kg/m2. Prenatal care is also remarkable for the following: [facility] Prenatal Labs  Dating L=6 Blood type: B/Positive/-- (04/23 1023)   Genetic Screen Inheritest: negative NIPS: Normal Female Antibody:Negative (04/23 1023)  Anatomic US Female, complete 8/13 Rubella: 2.40 (04/23 1023) Varicella: Immune  GTT Early: 119               Third trimester: 169, 3hr WNL RPR: Non Reactive (04/23 1023)   Rhogam N/A HBsAg: Negative (04/23 1023)   TDaP vaccine     03/27/2018                   Flu Shot: Declines HIV: Non Reactive (04/23 1023)   Baby Food    Breast                            GBS:  Negative  Contraception  POP then Patch when able Pap: N/A, less than 20 years old  CBB     Support Person            Maternal Medical History:   Past Medical History:  Diagnosis Date  . Arthritis    in back  . Migraines     Past Surgical History:  Procedure Laterality Date  . WISDOM TOOTH EXTRACTION      No Known Allergies  Prior to Admission medications   Medication Sig Start Date End Date Taking? Authorizing Provider  Prenatal Vit-Fe Fumarate-FA (PRENATAL VITAMIN) 27-0.8 MG TABS Take 1 tablet by mouth daily. 10/11/17  Yes [provider]      Social History: She  reports that she has never smoked. She has never used smokeless tobacco. She reports that she does not drink  alcohol or use drugs.  Family History: family history includes Diabetes in her maternal grandfather and maternal grandmother; Hypertension in her maternal grandfather and maternal grandmother; Migraines in her mother.   Review of Systems: Negative x 10 systems reviewed except as noted in the HPI.      Physical Exam:  Vital Signs: BP 124/74 (BP Location: Right Arm)   Pulse 97   Temp 98.6 F (37 C) (Oral)   Resp 20   Ht 5' (1.524 m)   Wt 93 kg   LMP 08/28/2017 (Approximate)   BMI 40.04 kg/m  General: breathing with contractions, squatting, kneeling on floor HEENT: normocephalic, atraumatic Heart: regular rate & rhythm.  No murmurs/rubs/gallops Lungs: clear to auscultation bilaterally Abdomen: soft, gravid, non-tender;  EFW: 7#14 oz Pelvic:   External: Normal external female genitalia  Cervix: Dilation: 3 / Effacement (%): 80, 90 / Station: -1 (changed from 1/70%/-3)  Extremities: non-tender, symmetric, trace edema bilaterally.  DTRs: +1  Neurologic: Alert & oriented x 3.   Baseline FHR: 150 baseline with accelerations to 170s, moderate variability Toco: contractions  every 2-3 minutes apart   Assessment:  Felicia Jones is a 22 y.o. G1P0 female at [redacted]w[redacted]d in early labor. FWB: Cat 1 tracing   Plan:  1. Admit to Labor & Delivery -anticipate vaginal delivery. Monitor progress and fetal  maternal well being. 2. CBC, T&S, Clrs, IVF 3. GBS negative.   4. Consents obtained. 5. Stadol for pain, epidural if desires 6. TDAP 03/27/2018 7. Breast/ minipill   Farrel Conners  05/16/2018 12:13 AM

## 2018-05-16 NOTE — Anesthesia Preprocedure Evaluation (Signed)
Anesthesia Evaluation  Patient identified by MRN, date of birth, ID band Patient awake    Reviewed: Allergy & Precautions, NPO status , Patient's Chart, lab work & pertinent test results  History of Anesthesia Complications Negative for: history of anesthetic complications  Airway Mallampati: III       Dental   Pulmonary neg sleep apnea, neg COPD,           Cardiovascular (-) hypertension(-) Past MI and (-) CHF (-) dysrhythmias (-) Valvular Problems/Murmurs     Neuro/Psych neg Seizures    GI/Hepatic Neg liver ROS, neg GERD  ,  Endo/Other  neg diabetesMorbid obesity  Renal/GU negative Renal ROS     Musculoskeletal   Abdominal   Peds  Hematology   Anesthesia Other Findings   Reproductive/Obstetrics (+) Pregnancy                             Anesthesia Physical Anesthesia Plan  ASA: II  Anesthesia Plan: Epidural   Post-op Pain Management:    Induction:   PONV Risk Score and Plan:   Airway Management Planned:   Additional Equipment:   Intra-op Plan:   Post-operative Plan:   Informed Consent: I have reviewed the patients History and Physical, chart, labs and discussed the procedure including the risks, benefits and alternatives for the proposed anesthesia with the patient or authorized representative who has indicated his/her understanding and acceptance.     Plan Discussed with:   Anesthesia Plan Comments:         Anesthesia Quick Evaluation

## 2018-05-16 NOTE — Progress Notes (Signed)
Patient ID: Felicia Jones, female   DOB: 01/13/1996, 22 y.o.   MRN: [id number]  Called at home (5:18) to evaluate strip for decelerations. Arrived at labor and delivery (5:28). Team had successfully carried out resuscitative measures.  She has an IUPC, FSE, and an amnio infusion. She has been repositioned. She is receiving oxygen. She was given terbutaline. Reviewed fetal heart rate strip. Discussed situation with patient and family. Informed them that she  Had the decelerations and that I would be monitoring her strip closely. Since the terbutaline the infant has recovered well. At this point, she has had multiple contractions without decelerations. There is moderate variability. SVE showed her to be 9/90/-3. No evidence of meconium on exam. Scalp stimulation performed with good response. Patient has made rapid cervical change. Infant has had a good recovery. Will continue to monitor closely. Discussed possibility of cesarean section or vacuum delivery with the patient.   Adelene Idlerhristanna Tandy Grawe MD [facility], [facility] 05/16/18 6:04 AM

## 2018-05-16 NOTE — Anesthesia Procedure Notes (Signed)
Epidural Patient location during procedure: OB Start time: 05/16/2018 2:17 AM End time: 05/16/2018 2:27 AM  Staffing Performed: anesthesiologist   Preanesthetic Checklist Completed: patient identified, site marked, surgical consent, pre-op evaluation, timeout performed, IV checked, risks and benefits discussed and monitors and equipment checked  Epidural Patient position: sitting Prep: Betadine Patient monitoring: heart rate, continuous pulse ox and blood pressure Approach: midline Location: L4-L5 Injection technique: LOR saline  Needle:  Needle type: Tuohy  Needle gauge: 17 G Needle length: 9 cm and 9 Needle insertion depth: 9 cm Catheter type: closed end flexible Catheter size: 20 Guage Catheter at skin depth: 13 cm Test dose: negative and 1.5% lidocaine with Epi 1:200 K  Assessment Events: blood not aspirated, injection not painful, no injection resistance, negative IV test and no paresthesia  Additional Notes   Patient tolerated the insertion well without complications.Reason for block:procedure for pain

## 2018-05-16 NOTE — Lactation Note (Signed)
This note was copied from a baby's chart. Lactation Consultation Note  Patient Name: [name]'XToday's Date: 05/16/2018 Reason for consult: Initial assessment   Maternal Data  Mother has WIC and did take a  Breast feeding class.  Feeding Feeding Type: Breast Fed  LATCH Score Latch: Repeated attempts needed to sustain latch, nipple held in mouth throughout feeding, stimulation needed to elicit sucking reflex.  Audible Swallowing: A few with stimulation  Type of Nipple: Everted at rest and after stimulation  Comfort (Breast/Nipple): Soft / non-tender  Hold (Positioning): Full assist, staff holds infant at breast  LATCH Score: 6  Interventions Interventions: Breast feeding basics reviewed;Assisted with latch  Lactation Tools Discussed/Used     Consult Status Consult Status: Follow-up Follow-up type: In-patient    Trudee GripCarolyn P Makia Bossi 05/16/2018, 3:03 PM

## 2018-05-17 LAB — CBC
HEMATOCRIT: 33.8 % — AB (ref 36.0–46.0)
Hemoglobin: 11.2 g/dL — ABNORMAL LOW (ref 12.0–15.0)
MCH: 27.5 pg (ref 26.0–34.0)
MCHC: 33.1 g/dL (ref 30.0–36.0)
MCV: 82.8 fL (ref 80.0–100.0)
NRBC: 0 % (ref 0.0–0.2)
PLATELETS: 225 10*3/uL (ref 150–400)
RBC: 4.08 MIL/uL (ref 3.87–5.11)
RDW: 14.9 % (ref 11.5–15.5)
WBC: 25 10*3/uL — ABNORMAL HIGH (ref 4.0–10.5)

## 2018-05-17 NOTE — Progress Notes (Signed)
Post Partum Day 1 Subjective: Having some lower back pain and lower abdominal cramping. Has been on Methergine po. Voiding OK, tolerating regular diet. Had BM this AM. Breast feeding  Objective: Blood pressure 106/65, pulse 69, temperature 97.9 F (36.6 C), temperature source Oral, resp. rate 18, height 5' (1.524 m), weight 93 kg, last menstrual period 08/28/2017, SpO2 99 %, unknown if currently breastfeeding.  Physical Exam:  General: alert, cooperative and no distress Lochia: appropriate Uterine Fundus: firm/ U-2/ ML/ NT  DVT Evaluation: No evidence of DVT seen on physical exam.  Recent Labs    05/16/18 0111 05/17/18 0556  HGB 12.9 11.2*  HCT 39.7 33.8*  WBC 19.7* 25.0*  PLT 301 225    Assessment/Plan: Stable PPD #1-continue postpartum care D/C Methergine Motrin for pain K pad for back B POS/ RI/ VI TDAP UTD Breast/ minipill  Farrel Connersolleen Vernette Moise, CNM    LOS: 1 day   Farrel ConnersColleen Krew Hortman 05/17/2018, 12:36 PM

## 2018-05-17 NOTE — Lactation Note (Signed)
This note was copied from a baby's chart. Lactation Consultation Note  Patient Name: Felicia Jones [id number]'s Date: 05/17/2018  Observed end of breast feed with good rhythmic sucking.  Mom denies any assistance needed.  Discussed supply and demand, normal course of lactation and routine newborn feeding patterns.  Lactation name and number written on white board and encouraged to call with any questions, concerns or assistance.   Maternal Data    Feeding Feeding Type: Breast Fed  LATCH Score                   Interventions    Lactation Tools Discussed/Used     Consult Status      Felicia Jones, Felicia Jones 05/17/2018, 2:56 PM

## 2018-05-17 NOTE — Anesthesia Postprocedure Evaluation (Signed)
Anesthesia Post Note  Patient: Eugenie Fillerykeria Mergen  Procedure(s) Performed: AN AD HOC LABOR EPIDURAL  Patient location during evaluation: Mother Baby Anesthesia Type: Epidural Level of consciousness: awake and alert Pain management: pain level controlled Vital Signs Assessment: post-procedure vital signs reviewed and stable Respiratory status: spontaneous breathing, nonlabored ventilation and respiratory function stable Cardiovascular status: stable Postop Assessment: no headache, no backache, epidural receding, patient able to bend at knees and able to ambulate Anesthetic complications: no     Last Vitals:  Vitals:   05/17/18 0410 05/17/18 0729  BP: (!) 100/59 106/65  Pulse: 72 69  Resp: 18 18  Temp: 36.8 C 36.6 C  SpO2: 98% 99%    Last Pain:  Vitals:   05/17/18 0729  TempSrc: Oral  PainSc:                  Karoline Caldwelleana Margo Lama

## 2018-05-18 LAB — RPR: RPR: NONREACTIVE

## 2018-05-18 NOTE — Progress Notes (Signed)
Discharge instructions provided.  Pt verbalizes understanding of all instructions and follow-up care.  Pt discharged to home with infant at 1515 on 05/18/18 via wheelchair by NT. Reynold BowenSusan Paisley Maitland Muhlbauer, RN 05/18/2018 5:04 PM

## 2018-05-18 NOTE — Discharge Instructions (Signed)
No strenuous activity or heavy lifting for 6 weeks.  No intercourse, tampons, or douching for 6 weeks.  No tub baths- showers only.  No driving for 2 weeks or while taking pain medications.  Increase calories and fluids while breastfeeding.  Call your doctor for increased pain or vaginal bleeding, temperature above 100.4, depression, or concerns.

## 2018-05-18 NOTE — Progress Notes (Signed)
Pt states that she received TDaP vaccine during pregnancy.  Education provided on need for Influenza vaccine.  Pt declines Influenza vaccine at this time. Reynold BowenSusan Paisley Treyshaun Keatts, RN 05/18/2018

## 2018-05-21 ENCOUNTER — Encounter: Payer: Medicaid Other | Admitting: Advanced Practice Midwife

## 2018-05-27 ENCOUNTER — Other Ambulatory Visit: Payer: Self-pay | Admitting: Maternal Newborn

## 2018-05-27 DIAGNOSIS — N898 Other specified noninflammatory disorders of vagina: Secondary | ICD-10-CM

## 2018-05-27 MED ORDER — FLUCONAZOLE 150 MG PO TABS: 150.0000 mg | ORAL_TABLET | Freq: Once | ORAL | 0 refills | Status: AC

## 2018-05-27 NOTE — Progress Notes (Signed)
Rx for Diflucan, recurrent yeast.

## 2018-07-08 ENCOUNTER — Encounter: Payer: Self-pay | Admitting: Obstetrics and Gynecology

## 2018-07-08 ENCOUNTER — Ambulatory Visit (INDEPENDENT_AMBULATORY_CARE_PROVIDER_SITE_OTHER): Payer: Medicaid Other | Admitting: Obstetrics and Gynecology

## 2018-07-08 DIAGNOSIS — Z30011 Encounter for initial prescription of contraceptive pills: Secondary | ICD-10-CM

## 2018-07-08 MED ORDER — NORETHINDRONE 0.35 MG PO TABS: 1.0000 | ORAL_TABLET | Freq: Every day | ORAL | 4 refills | Status: DC

## 2018-07-08 NOTE — Progress Notes (Signed)
Postpartum Visit   Chief Complaint  Patient presents with  . 6 week post partum   History of Present Illness: Patient is a 23 y.o. G1P1001 presents for postpartum visit.  Date of delivery: [birth year]1989 Type of delivery: Vaginal delivery - Vacuum or forceps assisted  no Episiotomy No.  Laceration: 1st degree Pregnancy or labor problems:  no Any problems since the delivery:  no  Newborn Details:  SINGLETON :  1. Baby's name: Loran Senters. Birth weight: 6.15 Maternal Details:  Breast Feeding:  yes Post partum depression/anxiety noted:  no Edinburgh Post-Partum Depression Score:  3  Date of last PAP: n/a due to age  Past Medical History:  Diagnosis Date  . Arthritis    in back  . Migraines     Past Surgical History:  Procedure Laterality Date  . WISDOM TOOTH EXTRACTION      Prior to Admission medications   Medication Sig Start Date End Date Taking? Authorizing Provider  Prenatal Vit-Fe Fumarate-FA (PRENATAL VITAMIN) 27-0.8 MG TABS Take 1 tablet by mouth daily. 10/11/17   [provider]    No Known Allergies   Social History   Socioeconomic History  . Marital status: Single    Spouse name: Not on file  . Number of children: Not on file  . Years of education: 61  . Highest education level: Not on file  Occupational History  . Occupation: Agricultural engineer    Comment: [employer]  Social Needs  . Financial resource strain: Not on file  . Food insecurity:    Worry: Not on file    Inability: Not on file  . Transportation needs:    Medical: Not on file    Non-medical: Not on file  Tobacco Use  . Smoking status: Never Smoker  . Smokeless tobacco: Never Used  Substance and Sexual Activity  . Alcohol use: No    Frequency: Never  . Drug use: No  . Sexual activity: Yes    Birth control/protection: None  Lifestyle  . Physical activity:    Days per week: Not on file    Minutes per session: Not on file  . Stress: Not on file  Relationships  . Social  connections:    Talks on phone: Not on file    Gets together: Not on file    Attends religious service: Not on file    Active member of club or organization: Not on file    Attends meetings of clubs or organizations: Not on file    Relationship status: Not on file  . Intimate partner violence:    Fear of current or ex partner: Not on file    Emotionally abused: Not on file    Physically abused: Not on file    Forced sexual activity: Not on file  Other Topics Concern  . Not on file  Social History Narrative  . Not on file    Family History  Problem Relation Age of Onset  . Migraines Mother   . Diabetes Maternal Grandmother   . Hypertension Maternal Grandmother   . Diabetes Maternal Grandfather   . Hypertension Maternal Grandfather     Review of Systems  Constitutional: Negative.   HENT: Negative.   Eyes: Negative.   Respiratory: Negative.   Cardiovascular: Negative.   Gastrointestinal: Negative.   Genitourinary: Negative.   Musculoskeletal: Negative.   Skin: Negative.   Neurological: Negative.   Psychiatric/Behavioral: Negative.      Physical Exam BP 118/74   Ht 5' (1.524 m)  Wt 190 lb (86.2 kg)   BMI 37.11 kg/m   Physical Exam Constitutional:      General: She is not in acute distress.    Appearance: Normal appearance.  Genitourinary:     Pelvic exam was performed with patient in the lithotomy position.     Vulva, inguinal canal, urethra, bladder, vagina, cervix, uterus, right adnexa and left adnexa normal.     Uterus is anteverted.  HENT:     Head: Normocephalic and atraumatic.  Eyes:     General: No scleral icterus.    Conjunctiva/sclera: Conjunctivae normal.  Cardiovascular:     Rate and Rhythm: Normal rate and regular rhythm.     Heart sounds: No murmur. No friction rub. No gallop.   Pulmonary:     Effort: Pulmonary effort is normal. No respiratory distress.     Breath sounds: Normal breath sounds.  Abdominal:     General: There is no  distension.     Palpations: Abdomen is soft. There is no mass.     Tenderness: There is no abdominal tenderness. There is no guarding.  Musculoskeletal: Normal range of motion.  Neurological:     General: No focal deficit present.     Mental Status: She is alert and oriented to person, place, and time.     Cranial Nerves: No cranial nerve deficit.  Skin:    General: Skin is warm and dry.  Psychiatric:        Mood and Affect: Mood normal.        Behavior: Behavior normal.        Thought Content: Thought content normal.        Judgment: Judgment normal.      Female Chaperone present during breast and/or pelvic exam.  Assessment: 23 y.o. G1P1001 presenting for 6 week postpartum visit  Plan: Problem List Items Addressed This Visit    None    Visit Diagnoses    Postpartum care and examination    -  Primary   Relevant Medications   norethindrone (MICRONOR,CAMILA,ERRIN) 0.35 MG tablet   Encounter for initial prescription of contraceptive pills       Relevant Medications   norethindrone (MICRONOR,CAMILA,ERRIN) 0.35 MG tablet     1) Contraception Education given regarding options for contraception, including oral contraceptives.  2)  Pap - ASCCP guidelines and rational discussed.  Due after next birthday  3) Patient underwent screening for postpartum depression with no concerns noted.  4) Follow up 1 year for routine annual exam  Thomasene Mohair, MD 07/08/2018 2:36 PM

## 2018-08-18 ENCOUNTER — Encounter: Payer: Self-pay | Admitting: Emergency Medicine

## 2018-08-18 ENCOUNTER — Emergency Department
Admission: EM | Admit: 2018-08-18 | Discharge: 2018-08-18 | Disposition: A | Discharge status: Home / Self Care | Payer: Medicaid Other | Attending: Emergency Medicine | Admitting: Emergency Medicine

## 2018-08-18 DIAGNOSIS — X102XXA Contact with fats and cooking oils, initial encounter: Secondary | ICD-10-CM | POA: Insufficient documentation

## 2018-08-18 DIAGNOSIS — T2122XA Burn of second degree of abdominal wall, initial encounter: Secondary | ICD-10-CM

## 2018-08-18 DIAGNOSIS — Y998 Other external cause status: Secondary | ICD-10-CM | POA: Diagnosis not present

## 2018-08-18 DIAGNOSIS — Y92000 Kitchen of unspecified non-institutional (private) residence as  the place of occurrence of the external cause: Secondary | ICD-10-CM | POA: Diagnosis not present

## 2018-08-18 DIAGNOSIS — Y93G3 Activity, cooking and baking: Secondary | ICD-10-CM | POA: Diagnosis not present

## 2018-08-18 DIAGNOSIS — T24211A Burn of second degree of right thigh, initial encounter: Secondary | ICD-10-CM | POA: Diagnosis not present

## 2018-08-18 MED ORDER — DOCUSATE SODIUM 100 MG PO CAPS: ORAL_CAPSULE | ORAL | 0 refills | Status: DC

## 2018-08-18 MED ORDER — BACITRACIN ZINC 500 UNIT/GM EX OINT
TOPICAL_OINTMENT | CUTANEOUS | Status: AC
  Administered 2018-08-18: 1 via TOPICAL
  Filled 2018-08-18: qty 2.7

## 2018-08-18 MED ORDER — OXYCODONE-ACETAMINOPHEN 5-325 MG PO TABS: 2.0000 | ORAL_TABLET | Freq: Four times a day (QID) | ORAL | 0 refills | Status: DC | PRN

## 2018-08-18 MED ORDER — OXYCODONE-ACETAMINOPHEN 5-325 MG PO TABS
1.0000 | ORAL_TABLET | Freq: Once | ORAL | Status: AC
  Administered 2018-08-18: 1 via ORAL
  Filled 2018-08-18: qty 1

## 2018-08-18 MED ORDER — IBUPROFEN 800 MG PO TABS
800.0000 mg | ORAL_TABLET | Freq: Once | ORAL | Status: AC
  Administered 2018-08-18: 800 mg via ORAL
  Filled 2018-08-18: qty 1
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
## 2019-11-19 ENCOUNTER — Other Ambulatory Visit: Payer: Self-pay | Admitting: Obstetrics and Gynecology

## 2019-11-19 DIAGNOSIS — Z3400 Encounter for supervision of normal first pregnancy, unspecified trimester: Secondary | ICD-10-CM

## 2020-01-06 ENCOUNTER — Telehealth: Payer: Self-pay

## 2020-01-06 NOTE — Telephone Encounter (Signed)
Pt calling; has had a little spotting when going to [facility].  [phone]  Pt states it started yesterday; was just when she wipes; did not see it the last time she went to the [facility]; no IC 24hrs before it started.  Adv to monitor and if becomes like a period flow to be seen.

## 2020-01-16 ENCOUNTER — Encounter: Payer: Medicaid Other | Admitting: Obstetrics

## 2020-06-16 ENCOUNTER — Encounter: Payer: Self-pay | Admitting: Obstetrics and Gynecology

## 2020-06-16 ENCOUNTER — Ambulatory Visit (INDEPENDENT_AMBULATORY_CARE_PROVIDER_SITE_OTHER): Payer: Medicaid Other | Admitting: Obstetrics and Gynecology

## 2020-06-16 ENCOUNTER — Other Ambulatory Visit: Payer: Self-pay

## 2020-06-16 ENCOUNTER — Other Ambulatory Visit (HOSPITAL_COMMUNITY)
Admission: RE | Admit: 2020-06-16 | Discharge: 2020-06-16 | Disposition: A | Discharge status: Home / Self Care | Payer: Medicaid Other | Source: Ambulatory Visit | Attending: Obstetrics and Gynecology | Admitting: Obstetrics and Gynecology

## 2020-06-16 VITALS — BP 118/62 | HR 90 | Wt 174.0 lb

## 2020-06-16 DIAGNOSIS — Z113 Encounter for screening for infections with a predominantly sexual mode of transmission: Secondary | ICD-10-CM | POA: Insufficient documentation

## 2020-06-16 DIAGNOSIS — Z7185 Encounter for immunization safety counseling: Secondary | ICD-10-CM

## 2020-06-16 DIAGNOSIS — Z3480 Encounter for supervision of other normal pregnancy, unspecified trimester: Secondary | ICD-10-CM

## 2020-06-16 DIAGNOSIS — Z124 Encounter for screening for malignant neoplasm of cervix: Secondary | ICD-10-CM | POA: Insufficient documentation

## 2020-06-16 DIAGNOSIS — Z3483 Encounter for supervision of other normal pregnancy, third trimester: Secondary | ICD-10-CM | POA: Insufficient documentation

## 2020-06-16 DIAGNOSIS — N926 Irregular menstruation, unspecified: Secondary | ICD-10-CM

## 2020-06-16 DIAGNOSIS — O99213 Obesity complicating pregnancy, third trimester: Secondary | ICD-10-CM | POA: Insufficient documentation

## 2020-06-16 DIAGNOSIS — E669 Obesity, unspecified: Secondary | ICD-10-CM | POA: Insufficient documentation

## 2020-06-16 DIAGNOSIS — Z3482 Encounter for supervision of other normal pregnancy, second trimester: Secondary | ICD-10-CM | POA: Insufficient documentation

## 2020-06-16 DIAGNOSIS — Z369 Encounter for antenatal screening, unspecified: Secondary | ICD-10-CM

## 2020-06-16 DIAGNOSIS — Z3481 Encounter for supervision of other normal pregnancy, first trimester: Secondary | ICD-10-CM

## 2020-06-16 DIAGNOSIS — O99210 Obesity complicating pregnancy, unspecified trimester: Secondary | ICD-10-CM | POA: Insufficient documentation

## 2020-06-16 LAB — OB RESULTS CONSOLE VARICELLA ZOSTER ANTIBODY, IGG: Varicella: IMMUNE

## 2020-06-16 MED ORDER — PROVIDA OB 20-20-1.25 MG PO CAPS: 1.0000 | ORAL_CAPSULE | Freq: Every day | ORAL | 3 refills | Status: AC

## 2020-06-16 MED ORDER — DOXYLAMINE-PYRIDOXINE 10-10 MG PO TBEC: 2.0000 | DELAYED_RELEASE_TABLET | Freq: Every day | ORAL | 5 refills | Status: DC

## 2020-06-17 LAB — RPR+RH+ABO+RUB AB+AB SCR+CB...
Antibody Screen: NEGATIVE
HIV Screen 4th Generation wRfx: NONREACTIVE
Hematocrit: 37.3 % (ref 34.0–46.6)
Hemoglobin: 11.9 g/dL (ref 11.1–15.9)
Hepatitis B Surface Ag: NEGATIVE
MCH: 26.4 pg — ABNORMAL LOW (ref 26.6–33.0)
MCHC: 31.9 g/dL (ref 31.5–35.7)
MCV: 83 fL (ref 79–97)
Platelets: 314 10*3/uL (ref 150–450)
RBC: 4.5 x10E6/uL (ref 3.77–5.28)
RDW: 14.2 % (ref 11.7–15.4)
RPR Ser Ql: NONREACTIVE
Rh Factor: POSITIVE
Rubella Antibodies, IGG: 2.28 index (ref >0.99)
Varicella zoster IgG: 313 index (ref >165)
WBC: 9.3 10*3/uL (ref 3.4–10.8)

## 2020-06-17 NOTE — Progress Notes (Signed)
New Obstetric Patient H&P    Chief Complaint: "Desires prenatal care"   History of Present Illness: Patient is a 24 y.o. G2P1001 Not Hispanic or Latino female, presents with amenorrhea and positive home pregnancy test. Patient's last menstrual period was 04/28/2020. and based on her  LMP, her EDD is Estimated Date of Delivery: 02/02/21 and her EGA is [redacted]w[redacted]d. Cycles are 3-4. days, irregular, and occur approximately every : not applicable days. Patient has not had a pap smear in the past.   She had a urine pregnancy test which was positive 3 or 4 week(s)  ago. Her last menstrual period was normal and lasted for  3 or 4 day(s). Since her LMP she claims she has experienced increased nausea and tiredness. She denies vaginal bleeding. Her past medical history is noncontributory. Her prior pregnancies are notable for term NSVD.  Since her LMP, she admits to the use of tobacco products  no She claims she has gained   no pounds since the start of her pregnancy.  There are cats in the home in the home  no  She admits close contact with children on a regular basis  no  She has had chicken pox in the past no She has had Tuberculosis exposures, symptoms, or previously tested positive for TB   no Current or past history of domestic violence. no  Genetic Screening/Teratology Counseling: (Includes patient, baby's father, or anyone in either family with:)   1. Patient's age >/= 65 at [facility]  no 2. Thalassemia ([place], [place], Mediterranean, or Asian background): MCV<80  no 3. Neural tube defect (meningomyelocele, spina bifida, anencephaly)  no 4. Congenital heart defect  no  5. Down syndrome  no 6. Tay-Sachs (Jewish, [place]  no 7. Canavan's Disease  no 8. Sickle cell disease or trait (African)  no  9. Hemophilia or other blood disorders  no  10. Muscular dystrophy  no  11. Cystic fibrosis  no  12. Huntington's Chorea  no  13. Mental retardation/autism  no 14. Other inherited genetic or  chromosomal disorder  no 15. Maternal metabolic disorder (DM, PKU, etc)  no 16. Patient or FOB with a child with a birth defect not listed above no  16a. Patient or FOB with a birth defect themselves no 17. Recurrent pregnancy loss, or stillbirth  no  18. Any medications since LMP other than prenatal vitamins (include vitamins, supplements, OTC meds, drugs, alcohol)  no 19. Any other genetic/environmental exposure to discuss  no  Infection History:   1. Lives with someone with TB or TB exposed  no  2. Patient or partner has history of genital herpes  no 3. Rash or viral illness since LMP  no 4. History of STI (GC, CT, HPV, syphilis, HIV)  no 5. History of recent travel :  no  Other pertinent information:  Patient currently employed at [employer]. Lives with first child.      Review of Systems:10 point review of systems negative unless otherwise noted in HPI  Past Medical History:  Patient Active Problem List   Diagnosis Date Noted  . Obesity (BMI 30.0-34.9) 06/16/2020  . Maternal obesity affecting pregnancy, antepartum 06/16/2020  . Supervision of other normal pregnancy, antepartum 06/16/2020    Clinic [facility] Prenatal Labs  Dating  LMP - [place] ordered Blood type:     Genetic Screen     NIPS: desires Antibody:   Anatomic [place]  Rubella:   Varicella: @VZVIGG @  GTT Early:  Third trimester:  RPR:     Rhogam  n/a HBsAg:     TDaP vaccine                       Flu Shot: declines HIV:     Baby Food   Breastfeeding                        GBS:   Contraception  Pap:  CBB     CS/VBAC    Support Person  Dequan        . Bilateral low back pain without sciatica 01/28/2016    Past Surgical History:  Past Surgical History:  Procedure Laterality Date  . WISDOM TOOTH EXTRACTION      Gynecologic History: Patient's last menstrual period was 04/28/2020.  Obstetric History: G2P1001  Family History:  Family History  Problem Relation Age of Onset  . Migraines Mother    . Diabetes Maternal Grandmother   . Hypertension Maternal Grandmother   . Diabetes Maternal Grandfather   . Hypertension Maternal Grandfather     Social History:  Social History   Socioeconomic History  . Marital status: Single    Spouse name: Not on file  . Number of children: Not on file  . Years of education: 98  . Highest education level: Not on file  Occupational History  . Occupation: Agricultural engineer    Comment: [employer]  Tobacco Use  . Smoking status: Never Smoker  . Smokeless tobacco: Never Used  Vaping Use  . Vaping Use: Never used  Substance and Sexual Activity  . Alcohol use: No  . Drug use: No  . Sexual activity: Yes    Birth control/protection: None  Other Topics Concern  . Not on file  Social History Narrative  . Not on file   Social Determinants of Health   Financial Resource Strain: Not on file  Food Insecurity: Not on file  Transportation Needs: Not on file  Physical Activity: Not on file  Stress: Not on file  Social Connections: Not on file  Intimate Partner Violence: Not on file    Allergies:  No Known Allergies  Medications: Prior to Admission medications   Medication Sig Start Date End Date Taking? Authorizing Provider  docusate sodium (COLACE) 100 MG capsule Take 1 tablet once or twice daily as needed for constipation while taking narcotic pain medicine Patient not taking: Reported on 06/16/2020 08/18/18   Loleta Rose, MD  Doxylamine-Pyridoxine (DICLEGIS) 10-10 MG TBEC Take 2 tablets by mouth at bedtime. If symptoms persist, add one tablet in the morning and one in the afternoon 06/16/20   Zipporah Plants, CNM  norethindrone (MICRONOR,CAMILA,ERRIN) 0.35 MG tablet Take 1 tablet (0.35 mg total) by mouth daily. Patient not taking: Reported on 06/16/2020 07/08/18   Conard Novak, MD  oxyCODONE-acetaminophen (PERCOCET) 5-325 MG tablet Take 2 tablets by mouth every 6 (six) hours as needed for severe pain. Patient not taking: Reported on  06/16/2020 08/18/18   Loleta Rose, MD  Prenat w/o A Vit-FeFum-FePo-FA (PROVIDA OB) 20-20-1.25 MG CAPS Take 1 capsule by mouth daily. 06/16/20 07/16/20  Zipporah Plants, CNM  Prenatal Vit-Fe Fumarate-FA (PRENATAL VITAMIN) 27-0.8 MG TABS Take 1 tablet by mouth daily. Patient not taking: Reported on 06/16/2020 10/11/17   [provider]    Physical Exam Vitals: Blood pressure 118/62, pulse 90, weight 174 lb (78.9 kg), last menstrual period 04/28/2020, unknown if currently breastfeeding.  General: NAD HEENT: normocephalic,  anicteric Thyroid: no enlargement, no palpable nodules Pulmonary: No increased work of breathing, CTAB Cardiovascular: RRR, distal pulses 2+ Abdomen: NABS, soft, non-tender, non-distended.  Umbilicus without lesions.  No hepatomegaly, splenomegaly or masses palpable. No evidence of hernia  Genitourinary:  External: Normal external female genitalia.  Normal urethral meatus, normal  Bartholin's and Skene's glands.    Vagina: Normal vaginal mucosa, no evidence of prolapse.    Cervix: Grossly normal in appearance, no bleeding  Uterus: Enlarged (size consistent with dates, 6-8 wks), mobile, normal contour.  No CMT  Adnexa: ovaries non-enlarged, no adnexal masses  Rectal: deferred Extremities: no edema, erythema, or tenderness Neurologic: Grossly intact Psychiatric: mood appropriate, affect full   Assessment: 24 y.o. G2P1001 at [redacted]w[redacted]d presenting to initiate prenatal care  Plan: 1) Avoid alcoholic beverages. 2) Patient encouraged not to smoke.  3) Discontinue the use of all non-medicinal drugs and chemicals.  4) Take prenatal vitamins daily.  5) Nutrition, food safety (fish, cheese advisories, and high nitrite foods) and exercise discussed. 6) Hospital and practice style discussed with cross coverage system.  7) Genetic Screening, such as with 1st Trimester Screening, cell free fetal DNA, AFP testing, and Ultrasound, as well as with amniocentesis and CVS as  appropriate, is discussed with patient. At the conclusion of today's visit patient requested genetic testing 8) 1h GTT, dating [place], and NIPTs (if greater than ten weeks) at next visit  RTC in 2 weeks  Zipporah Plants, CNM, MSN [facility], [facility] 06/16/2020, 4:08 PM

## 2020-06-18 ENCOUNTER — Telehealth: Payer: Self-pay

## 2020-06-18 LAB — URINE CULTURE

## 2020-06-18 LAB — CYTOLOGY - PAP
Chlamydia: NEGATIVE
Comment: NEGATIVE
Comment: NORMAL
Diagnosis: NEGATIVE
Neisseria Gonorrhea: NEGATIVE

## 2020-06-18 NOTE — Telephone Encounter (Signed)
Pt aware letter should be available in MyChart.

## 2020-06-18 NOTE — Telephone Encounter (Signed)
Pt calling for letter stating she is preg to be available on MyChart.  [phone]

## 2020-06-19 LAB — URINE DRUG PANEL 7
Amphetamines, Urine: NEGATIVE ng/mL
Barbiturate Quant, Ur: NEGATIVE ng/mL
Benzodiazepine Quant, Ur: NEGATIVE ng/mL
Cannabinoid Quant, Ur: NEGATIVE ng/mL
Cocaine (Metab.): NEGATIVE ng/mL
Opiate Quant, Ur: NEGATIVE ng/mL
PCP Quant, Ur: NEGATIVE ng/mL

## 2020-06-30 ENCOUNTER — Other Ambulatory Visit: Payer: Self-pay

## 2020-06-30 ENCOUNTER — Ambulatory Visit (INDEPENDENT_AMBULATORY_CARE_PROVIDER_SITE_OTHER): Payer: Medicaid Other

## 2020-06-30 ENCOUNTER — Ambulatory Visit (INDEPENDENT_AMBULATORY_CARE_PROVIDER_SITE_OTHER): Payer: Medicaid Other | Admitting: Obstetrics and Gynecology

## 2020-06-30 ENCOUNTER — Other Ambulatory Visit: Payer: Medicaid Other

## 2020-06-30 VITALS — BP 100/50 | Wt 171.0 lb

## 2020-06-30 DIAGNOSIS — Z3481 Encounter for supervision of other normal pregnancy, first trimester: Secondary | ICD-10-CM

## 2020-06-30 DIAGNOSIS — N926 Irregular menstruation, unspecified: Secondary | ICD-10-CM | POA: Diagnosis not present

## 2020-06-30 DIAGNOSIS — Z3A09 9 weeks gestation of pregnancy: Secondary | ICD-10-CM

## 2020-06-30 DIAGNOSIS — Z3480 Encounter for supervision of other normal pregnancy, unspecified trimester: Secondary | ICD-10-CM

## 2020-06-30 DIAGNOSIS — E669 Obesity, unspecified: Secondary | ICD-10-CM

## 2020-06-30 LAB — POCT URINALYSIS DIPSTICK OB: Glucose, UA: NEGATIVE

## 2020-06-30 MED ORDER — PROVIDA OB 20-20-1.25 MG PO CAPS: 1.0000 | ORAL_CAPSULE | Freq: Every day | ORAL | 3 refills | Status: AC

## 2020-06-30 MED ORDER — ONDANSETRON 4 MG PO TBDP
4.0000 mg | ORAL_TABLET | Freq: Four times a day (QID) | ORAL | 1 refills | Status: DC | PRN
Start: 2020-06-30 — End: 2020-10-02

## 2020-06-30 NOTE — Progress Notes (Signed)
Routine Prenatal Care Visit  Subjective  Felicia Jones is a 24 y.o. G2P1001 at [redacted]w[redacted]d being seen today for ongoing prenatal care.  She is currently monitored for the following issues for this low-risk pregnancy and has Bilateral low back pain without sciatica; Obesity (BMI 30.0-34.9); Maternal obesity affecting pregnancy, antepartum; and Supervision of other normal pregnancy, antepartum on their problem list.  ----------------------------------------------------------------------------------- Patient reports continued nausea, not well controlled with diclegis rx.    . Vag. Bleeding: None.   . Denies leaking of fluid.  ----------------------------------------------------------------------------------- The following portions of the patient's history were reviewed and updated as appropriate: allergies, current medications, past family history, past medical history, past social history, past surgical history and problem list. Problem list updated.   Objective  Blood pressure (!) 100/50, weight 171 lb (77.6 kg), unknown if currently breastfeeding. Pregravid weight 174 lb (78.9 kg) Total Weight Gain -3 lb (-1.361 kg) Urinalysis:      Fetal Status: Fetal Heart Rate (bpm): 178         General:  Alert, oriented and cooperative. Patient is in no acute distress.  Skin: Skin is warm and dry. No rash noted.   Cardiovascular: Normal heart rate noted  Respiratory: Normal respiratory effort, no problems with respiration noted  Abdomen: Soft, gravid, appropriate for gestational age. Pain/Pressure: Absent     Pelvic:  Cervical exam deferred        Extremities: Normal range of motion.     ental Status: Normal mood and affect. Normal behavior. Normal judgment and thought content.     Assessment   24 y.o. G2P1001 at [redacted]w[redacted]d by  01/31/2021, by Ultrasound presenting for routine prenatal visit  Plan   pregnancy 2 Problems (from 06/16/20 to present)    Problem Noted Resolved   Obesity (BMI 30.0-34.9)  06/16/2020 by Zipporah Plants, CNM No   Maternal obesity affecting pregnancy, antepartum 06/16/2020 by Zipporah Plants, CNM No   Supervision of other normal pregnancy, antepartum 06/16/2020 by Zipporah Plants, CNM No   Overview Addendum 06/30/2020 12:02 PM by Zipporah Plants, CNM    Clinic [facility] Prenatal Labs  Dating  9w dating [place] Blood type: B/Positive/-- (12/15 1603)   Genetic Screen     NIPS: desires Antibody:Negative (12/15 1603)  Anatomic [place]  Rubella: 2.28 (12/15 1603) Varicella: immune  GTT Early:               Third trimester:  RPR: Non Reactive (12/15 1603)   Rhogam  n/a HBsAg: Negative (12/15 1603)   TDaP vaccine           Flu Shot: declines HIV: Non Reactive (12/15 1603)   Baby Food   Breastfeeding                        GBS:   Contraception  Pap: NILM 06/15/20  CBB     CS/VBAC    Support Person  Dequan           Previous Version       -Zofran as needed -Patient reports she was very unsure about LMP and desires dating from [place] today - EDD updated -Reviewed dating [place] with patient  Gestational age appropriate obstetric precautions including but not limited to vaginal bleeding, contractions, leaking of fluid and fetal movement were reviewed in detail with the patient.    Return in about 4 weeks (around 07/28/2020) for ROB.  Zipporah Plants, CNM, MSN [facility], [facility] 06/30/2020, 12:02 PM

## 2020-06-30 NOTE — Progress Notes (Signed)
C/o nausea - tries to throw up but can't so no appetite.

## 2020-07-01 LAB — GLUCOSE, 1 HOUR GESTATIONAL: Gestational Diabetes Screen: 105 mg/dL (ref 65–139)

## 2020-07-03 NOTE — L&D Delivery Note (Signed)
Delivery Note At 3:31 PM a viable female was delivered via Vaginal, Spontaneous (Presentation: Right Occiput Anterior).  APGAR: 8, 9; weight pending.   Placenta status: Spontaneous, Intact.  Cord: 3 vessels, loose nuchal x 1 reduced on perineum without complications: None.  Cord pH: None  Anesthesia: None Episiotomy: None Lacerations:  none Suture Repair:  none Est. Blood Loss (mL):   Mom to postpartum.  Baby to Couplet care / Skin to Skin.  Vena [place] 01/16/2021, 3:46 PM

## 2020-07-08 ENCOUNTER — Telehealth: Payer: Self-pay

## 2020-07-08 NOTE — Telephone Encounter (Signed)
LMVM. Advised headache can be a COVID symptom. If she hasn't been tested, she may consider being tested. Dehydration can cause headache as well. Remain well hydrated. Other than tylenol, patient advised can have 12 oz of caffeine daily (including drink/chocolate). Patient to return call with any further questions/concerns.

## 2020-07-08 NOTE — Telephone Encounter (Signed)
Patient has had a headache since last night unrelieved by tylenol. Inquiring if there's anything else she can take. Uncertain if d/t taking meds for nausea and not eating as much. Inquiring if there is anything else she can take to prevent headaches and not have them last as long. [phone]

## 2020-07-21 ENCOUNTER — Other Ambulatory Visit: Payer: Self-pay

## 2020-07-21 ENCOUNTER — Encounter: Payer: Self-pay | Admitting: Obstetrics and Gynecology

## 2020-07-21 ENCOUNTER — Ambulatory Visit (INDEPENDENT_AMBULATORY_CARE_PROVIDER_SITE_OTHER): Payer: Medicaid Other | Admitting: Obstetrics and Gynecology

## 2020-07-21 VITALS — BP 112/58 | Wt 174.0 lb

## 2020-07-21 DIAGNOSIS — Z3A12 12 weeks gestation of pregnancy: Secondary | ICD-10-CM

## 2020-07-21 DIAGNOSIS — N949 Unspecified condition associated with female genital organs and menstrual cycle: Secondary | ICD-10-CM

## 2020-07-21 DIAGNOSIS — Z1379 Encounter for other screening for genetic and chromosomal anomalies: Secondary | ICD-10-CM

## 2020-07-21 DIAGNOSIS — Z3482 Encounter for supervision of other normal pregnancy, second trimester: Secondary | ICD-10-CM

## 2020-07-21 MED ORDER — CITRANATAL ASSURE 35-1 & 300 MG PO MISC: 2.0000 | Freq: Every day | ORAL | 3 refills | Status: DC

## 2020-07-21 NOTE — Progress Notes (Signed)
Routine Prenatal Care Visit  Subjective  Felicia Jones is a 25 y.o. G2P1001 at [redacted]w[redacted]d being seen today for ongoing prenatal care.  She is currently monitored for the following issues for this low-risk pregnancy and has Bilateral low back pain without sciatica; Obesity (BMI 30.0-34.9); Maternal obesity affecting pregnancy, antepartum; and Encounter for supervision of other normal pregnancy, second trimester on their problem list.  ----------------------------------------------------------------------------------- Patient reports improvement in nausea. Only occasional now. Patient reports intermittent pain in her right lower abdomen that feels sharp and like a pulling sensation..    .  .   . Denies leaking of fluid.  ----------------------------------------------------------------------------------- The following portions of the patient's history were reviewed and updated as appropriate: allergies, current medications, past family history, past medical history, past social history, past surgical history and problem list. Problem list updated.   Objective  Blood pressure (!) 112/58, weight 174 lb (78.9 kg), unknown if currently breastfeeding. Pregravid weight 174 lb (78.9 kg) Total Weight Gain 0 lb (0 kg) Urinalysis:      Fetal Status:           General:  Alert, oriented and cooperative. Patient is in no acute distress.  Skin: Skin is warm and dry. No rash noted.   Cardiovascular: Normal heart rate noted  Respiratory: Normal respiratory effort, no problems with respiration noted  Abdomen: Soft, gravid, appropriate for gestational age.       Pelvic:  Cervical exam deferred        Extremities: Normal range of motion.     ental Status: Normal mood and affect. Normal behavior. Normal judgment and thought content.     Assessment   25 y.o. G2P1001 at [redacted]w[redacted]d by  01/31/2021, by Ultrasound presenting for routine prenatal visit  Plan   pregnancy 2 Problems (from 06/16/20 to present)     Problem Noted Resolved   Obesity (BMI 30.0-34.9) 06/16/2020 by Zipporah Plants, CNM No   Maternal obesity affecting pregnancy, antepartum 06/16/2020 by Zipporah Plants, CNM No   Encounter for supervision of other normal pregnancy, second trimester 06/16/2020 by Zipporah Plants, CNM No   Overview Addendum 07/21/2020  8:22 AM by Zipporah Plants, CNM     Nursing Staff Provider  Office Location  [facility] Dating   9w [place]  Language  English Anatomy US    Flu Vaccine   declined Genetic Screen  NIPS: collected    TDaP vaccine    Hgb A1C or  GTT Early : 105 Third trimester :   Rhogam   n/a   LAB RESULTS   Feeding Plan  breast Blood Type B/Positive/-- (12/15 1603)   Contraception  Antibody Negative (12/15 1603)  Circumcision  Rubella 2.28 (12/15 1603)  Pediatrician   RPR Non Reactive (12/15 1603)   Support Person  Dequan HBsAg Negative (12/15 1603)   Prenatal Classes  HIV Non Reactive (12/15 1603)    Varicella  immune  BTL Consent  GBS  (For PCN allergy, check sensitivities)        VBAC Consent  n/a Pap  06/15/20 NILM    Hgb Electro   normal hgb    CF  negative     SMA  negative               Previous Version     -NIPS collected today -Discussed pain consistent in presentation with round ligament discomfort. Recommended warm epsom salt baths and tylenol. Patient to f/u if pain worsens. -PNV rx'd   First trimester precautions including but  not limited to vaginal bleeding, contractions, leaking of fluid and fetal movement were reviewed in detail with the patient.    Return in about 4 weeks (around 08/18/2020) for ROB.  Zipporah Plants, CNM, MSN [facility], [facility] 07/21/2020, 9:15 AM

## 2020-07-26 LAB — MATERNIT 21 PLUS CORE, BLOOD
Fetal Fraction: 10
Result (T21): NEGATIVE
Trisomy 13 (Patau syndrome): NEGATIVE
Trisomy 18 (Edwards syndrome): NEGATIVE
Trisomy 21 (Down syndrome): NEGATIVE

## 2020-08-18 ENCOUNTER — Other Ambulatory Visit: Payer: Self-pay

## 2020-08-18 ENCOUNTER — Ambulatory Visit (INDEPENDENT_AMBULATORY_CARE_PROVIDER_SITE_OTHER): Payer: Medicaid Other | Admitting: Obstetrics and Gynecology

## 2020-08-18 VITALS — BP 110/58 | Wt 177.0 lb

## 2020-08-18 DIAGNOSIS — Z3482 Encounter for supervision of other normal pregnancy, second trimester: Secondary | ICD-10-CM

## 2020-08-18 DIAGNOSIS — Z3A16 16 weeks gestation of pregnancy: Secondary | ICD-10-CM

## 2020-08-18 DIAGNOSIS — Z363 Encounter for antenatal screening for malformations: Secondary | ICD-10-CM

## 2020-08-18 DIAGNOSIS — O99210 Obesity complicating pregnancy, unspecified trimester: Secondary | ICD-10-CM

## 2020-08-18 LAB — POCT URINALYSIS DIPSTICK OB
Glucose, UA: NEGATIVE
POC,PROTEIN,UA: NEGATIVE

## 2020-08-18 NOTE — Progress Notes (Signed)
Routine Prenatal Care Visit  Subjective  Felicia Jones is a 25 y.o. G2P1001 at [redacted]w[redacted]d being seen today for ongoing prenatal care.  She is currently monitored for the following issues for this low-risk pregnancy and has Bilateral low back pain without sciatica; Obesity (BMI 30.0-34.9); Maternal obesity affecting pregnancy, antepartum; and Encounter for supervision of other normal pregnancy, second trimester on their problem list.  ----------------------------------------------------------------------------------- Patient reports headache.    . Vag. Bleeding: None.  Movement: Absent. Denies leaking of fluid.  ----------------------------------------------------------------------------------- The following portions of the patient's history were reviewed and updated as appropriate: allergies, current medications, past family history, past medical history, past social history, past surgical history and problem list. Problem list updated.   Objective  Blood pressure (!) 110/58, weight 177 lb (80.3 kg), unknown if currently breastfeeding. Pregravid weight 174 lb (78.9 kg) Total Weight Gain 3 lb (1.361 kg)  Body mass index is 34.57 kg/m.  Urinalysis:      Fetal Status: Fetal Heart Rate (bpm): 150   Movement: Absent     General:  Alert, oriented and cooperative. Patient is in no acute distress.  Skin: Skin is warm and dry. No rash noted.   Cardiovascular: Normal heart rate noted  Respiratory: Normal respiratory effort, no problems with respiration noted  Abdomen: Soft, gravid, appropriate for gestational age. Pain/Pressure: Absent     Pelvic:  Cervical exam deferred        Extremities: Normal range of motion.     ental Status: Normal mood and affect. Normal behavior. Normal judgment and thought content.     Assessment   25 y.o. G2P1001 at [redacted]w[redacted]d by  01/31/2021, by Ultrasound presenting for routine prenatal visit  Plan   pregnancy 2 Problems (from 06/16/20 to present)    Problem Noted  Resolved   Obesity (BMI 30.0-34.9) 06/16/2020 by Zipporah Plants, CNM No   Maternal obesity affecting pregnancy, antepartum 06/16/2020 by Zipporah Plants, CNM No   Encounter for supervision of other normal pregnancy, second trimester 06/16/2020 by Zipporah Plants, CNM No   Overview Addendum 07/28/2020  8:31 AM by Zipporah Plants, CNM     Nursing Staff Provider  Office Location  [facility] Dating   9w [place]  Language  English Anatomy US    Flu Vaccine   declined Genetic Screen  NIPS: neg x 3, XY   TDaP vaccine    Hgb A1C or  GTT Early : 105 Third trimester :   Rhogam   n/a   LAB RESULTS   Feeding Plan  breast Blood Type B/Positive/-- (12/15 1603)   Contraception  Antibody Negative (12/15 1603)  Circumcision  Rubella 2.28 (12/15 1603)  Pediatrician   RPR Non Reactive (12/15 1603)   Support Person  Dequan HBsAg Negative (12/15 1603)   Prenatal Classes  HIV Non Reactive (12/15 1603)    Varicella  immune  BTL Consent  GBS  (For PCN allergy, check sensitivities)        VBAC Consent  n/a Pap  06/15/20 NILM    Hgb Electro   normal hgb    CF  negative     SMA  negative               Previous Version       Gestational age appropriate obstetric precautions including but not limited to vaginal bleeding, contractions, leaking of fluid and fetal movement were reviewed in detail with the patient.    Return in about 4 weeks (around 09/15/2020) for ROB and anatomy  scan.  Vena [place], MD, [facility], [facility] 08/18/2020, 8:39 AM

## 2020-09-15 ENCOUNTER — Other Ambulatory Visit: Payer: Self-pay

## 2020-09-15 ENCOUNTER — Ambulatory Visit (INDEPENDENT_AMBULATORY_CARE_PROVIDER_SITE_OTHER): Payer: Medicaid Other | Admitting: Obstetrics and Gynecology

## 2020-09-15 ENCOUNTER — Ambulatory Visit (INDEPENDENT_AMBULATORY_CARE_PROVIDER_SITE_OTHER): Payer: Medicaid Other

## 2020-09-15 ENCOUNTER — Encounter: Payer: Self-pay | Admitting: Obstetrics and Gynecology

## 2020-09-15 VITALS — BP 118/74 | Wt 183.0 lb

## 2020-09-15 DIAGNOSIS — Z3482 Encounter for supervision of other normal pregnancy, second trimester: Secondary | ICD-10-CM

## 2020-09-15 DIAGNOSIS — O26892 Other specified pregnancy related conditions, second trimester: Secondary | ICD-10-CM

## 2020-09-15 DIAGNOSIS — Z363 Encounter for antenatal screening for malformations: Secondary | ICD-10-CM | POA: Diagnosis not present

## 2020-09-15 DIAGNOSIS — O99210 Obesity complicating pregnancy, unspecified trimester: Secondary | ICD-10-CM

## 2020-09-15 DIAGNOSIS — Z3A20 20 weeks gestation of pregnancy: Secondary | ICD-10-CM

## 2020-09-15 DIAGNOSIS — R519 Headache, unspecified: Secondary | ICD-10-CM

## 2020-09-15 MED ORDER — BUTALBITAL-APAP-CAFFEINE 50-325-40 MG PO TABS: 1.0000 | ORAL_TABLET | Freq: Four times a day (QID) | ORAL | 0 refills | Status: DC | PRN

## 2020-09-15 NOTE — Progress Notes (Signed)
Routine Prenatal Care Visit  Subjective  Felicia Jones is a 25 y.o. G2P1001 at [redacted]w[redacted]d being seen today for ongoing prenatal care.  She is currently monitored for the following issues for this high-risk pregnancy and has Bilateral low back pain without sciatica; Obesity (BMI 30.0-34.9); Maternal obesity affecting pregnancy, antepartum; and Encounter for supervision of other normal pregnancy, second trimester on their problem list.  ----------------------------------------------------------------------------------- Patient reports headache.    . Vag. Bleeding: None.  Movement: Present. Leaking Fluid denies.  Anatomy u/s complete. ----------------------------------------------------------------------------------- The following portions of the patient's history were reviewed and updated as appropriate: allergies, current medications, past family history, past medical history, past social history, past surgical history and problem list. Problem list updated.  Objective  Blood pressure 118/74, weight 183 lb (83 kg), unknown if currently breastfeeding. Pregravid weight 174 lb (78.9 kg) Total Weight Gain 9 lb (4.082 kg) Urinalysis: Urine Protein    Urine Glucose    Fetal Status: Fetal Heart Rate (bpm): nml ([place])   Movement: Present     General:  Alert, oriented and cooperative. Patient is in no acute distress.  Skin: Skin is warm and dry. No rash noted.   Cardiovascular: Normal heart rate noted  Respiratory: Normal respiratory effort, no problems with respiration noted  Abdomen: Soft, gravid, appropriate for gestational age. Pain/Pressure: Absent     Pelvic:  Cervical exam deferred        Extremities: Normal range of motion.     Mental Status: Normal mood and affect. Normal behavior. Normal judgment and thought content.   Imaging Results US OB Comp + 14 Wk  Result Date: 09/15/2020 Patient Name: Leiya Keesey DOB: 1996-06-22 MRN: [id number] ULTRASOUND REPORT Location: [facility] Date of  Service: 09/15/2020 Indications:Anatomy Ultrasound Findings: Mason Jim intrauterine pregnancy is visualized with FHR at 149 BPM. Biometrics give an (U/S) Gestational age of [redacted]w[redacted]d and an (U/S) EDD of 02/01/2021; this correlates with the clinically established Estimated Date of Delivery: 01/31/2021 Fetal presentation is Cephalic. EFW: 343g (12oz). Placenta: posterior. Grade: 1. Cervix: 4.3cm. AFI: subjectively normal. Anatomic survey is complete and normal; Gender - female.  Right Ovary: is normal in appearance. Left Ovary: is normal appearance. Survey of the adnexa demonstrates no adnexal masses. There is no free peritoneal fluid in the cul de sac. Impression: 1. [redacted]w[redacted]d Viable Singleton Intrauterine pregnancy by U/S. 2. (U/S) EDD is consistent with Clinically established Estimated Date of Delivery: 01/31/21 . 3. Normal Anatomy Scan Darlina Guys, RT There is a singleton gestation with subjectively normal amniotic fluid volume. The fetal biometry correlates with established dating. Detailed evaluation of the fetal anatomy was performed.The fetal anatomical survey appears within normal limits within the resolution of ultrasound as described above.  It must be noted that a normal ultrasound is unable to rule out fetal aneuploidy nor is it able to detect all possible malformations.   The ultrasound images and findings were reviewed by me and I agree with the above report. Thomasene Mohair, MD, [facility], [facility] 09/15/2020 10:25 AM       Assessment   25 y.o. G2P1001 at [redacted]w[redacted]d by  01/31/2021, by Ultrasound presenting for routine prenatal visit  Plan   pregnancy 2 Problems (from 06/16/20 to present)    Problem Noted Resolved   Obesity (BMI 30.0-34.9) 06/16/2020 by Zipporah Plants, CNM No   Maternal obesity affecting pregnancy, antepartum 06/16/2020 by Zipporah Plants, CNM No   Encounter for supervision of other normal pregnancy, second trimester 06/16/2020 by Zipporah Plants, CNM  No   Overview  Addendum 07/28/2020  8:31 AM by Zipporah Plants, CNM     Nursing Staff Provider  Office Location  [facility] Dating   9w [place]  Language  English Anatomy US    Flu Vaccine   declined Genetic Screen  NIPS: neg x 3, XY   TDaP vaccine    Hgb A1C or  GTT Early : 105 Third trimester :   Rhogam   n/a   LAB RESULTS   Feeding Plan  breast Blood Type B/Positive/-- (12/15 1603)   Contraception  Antibody Negative (12/15 1603)  Circumcision  Rubella 2.28 (12/15 1603)  Pediatrician   RPR Non Reactive (12/15 1603)   Support Person  Dequan HBsAg Negative (12/15 1603)   Prenatal Classes  HIV Non Reactive (12/15 1603)    Varicella  immune  BTL Consent  GBS  (For PCN allergy, check sensitivities)        VBAC Consent  n/a Pap  06/15/20 NILM    Hgb Electro   normal hgb    CF  negative     SMA  negative               Previous Version       Preterm labor symptoms and general obstetric precautions including but not limited to vaginal bleeding, contractions, leaking of fluid and fetal movement were reviewed in detail with the patient. Please refer to After Visit Summary for other counseling recommendations.   Headaches: mag oxide 400 mg daily PO, fioricet rx  Return in about 4 weeks (around 10/13/2020) for Routine Prenatal Appointment.   Thomasene Mohair, MD, [facility], [facility] 09/15/2020 10:37 AM

## 2020-10-01 ENCOUNTER — Other Ambulatory Visit: Payer: Self-pay

## 2020-10-01 DIAGNOSIS — R1013 Epigastric pain: Secondary | ICD-10-CM | POA: Diagnosis present

## 2020-10-01 DIAGNOSIS — R1012 Left upper quadrant pain: Secondary | ICD-10-CM | POA: Insufficient documentation

## 2020-10-01 DIAGNOSIS — R110 Nausea: Secondary | ICD-10-CM | POA: Insufficient documentation

## 2020-10-01 NOTE — ED Triage Notes (Signed)
Pt presents to ER with c/o nausea, upper abd pain and chest pain.  Pt states abd pain and nausea started this am and has been constant since.  Pt states she has not vomited or had diarrhea.  Pt states chest pain started around 2 hrs ago after eating chick-fil-a.  Pt is [redacted] weeks pregnant at this time but denies vaginal bleeding.

## 2020-10-02 ENCOUNTER — Emergency Department: Payer: Medicaid Other

## 2020-10-02 ENCOUNTER — Emergency Department
Admission: EM | Admit: 2020-10-02 | Discharge: 2020-10-02 | Disposition: A | Discharge status: Home / Self Care | Payer: Medicaid Other | Attending: Emergency Medicine | Admitting: Emergency Medicine

## 2020-10-02 DIAGNOSIS — R1013 Epigastric pain: Secondary | ICD-10-CM

## 2020-10-02 LAB — TROPONIN I (HIGH SENSITIVITY)
Troponin I (High Sensitivity): 3 ng/L (ref <18)
Troponin I (High Sensitivity): 3 ng/L (ref <18)

## 2020-10-02 LAB — COMPREHENSIVE METABOLIC PANEL
ALT: 7 U/L (ref 0–44)
AST: 16 U/L (ref 15–41)
Albumin: 2.9 g/dL — ABNORMAL LOW (ref 3.5–5.0)
Alkaline Phosphatase: 49 U/L (ref 38–126)
Anion gap: 8 (ref 5–15)
BUN: 8 mg/dL (ref 6–20)
CO2: 22 mmol/L (ref 22–32)
Calcium: 7.9 mg/dL — ABNORMAL LOW (ref 8.9–10.3)
Chloride: 105 mmol/L (ref 98–111)
Creatinine, Ser: 0.51 mg/dL (ref 0.44–1.00)
GFR, Estimated: >60 mL/min (ref >60)
Glucose, Bld: 86 mg/dL (ref 70–99)
Potassium: 3.6 mmol/L (ref 3.5–5.1)
Sodium: 135 mmol/L (ref 135–145)
Total Bilirubin: 0.3 mg/dL (ref 0.3–1.2)
Total Protein: 6.1 g/dL — ABNORMAL LOW (ref 6.5–8.1)

## 2020-10-02 LAB — URINALYSIS, COMPLETE (UACMP) WITH MICROSCOPIC
Bacteria, UA: NONE SEEN
Bilirubin Urine: NEGATIVE
Glucose, UA: NEGATIVE mg/dL
Hgb urine dipstick: NEGATIVE
Ketones, ur: NEGATIVE mg/dL
Leukocytes,Ua: NEGATIVE
Nitrite: NEGATIVE
Protein, ur: NEGATIVE mg/dL
Specific Gravity, Urine: 1.024 (ref 1.005–1.030)
pH: 7 (ref 5.0–8.0)

## 2020-10-02 LAB — CBC
HCT: 35.1 % — ABNORMAL LOW (ref 36.0–46.0)
Hemoglobin: 11.5 g/dL — ABNORMAL LOW (ref 12.0–15.0)
MCH: 27.3 pg (ref 26.0–34.0)
MCHC: 32.8 g/dL (ref 30.0–36.0)
MCV: 83.2 fL (ref 80.0–100.0)
Platelets: 210 10*3/uL (ref 150–400)
RBC: 4.22 MIL/uL (ref 3.87–5.11)
RDW: 14 % (ref 11.5–15.5)
WBC: 10.7 10*3/uL — ABNORMAL HIGH (ref 4.0–10.5)
nRBC: 0 % (ref 0.0–0.2)

## 2020-10-02 LAB — LIPASE, BLOOD: Lipase: 30 U/L (ref 11–51)

## 2020-10-02 MED ORDER — ACETAMINOPHEN 500 MG PO TABS
1000.0000 mg | ORAL_TABLET | Freq: Once | ORAL | Status: AC
  Administered 2020-10-02: 1000 mg via ORAL
  Filled 2020-10-02: qty 2

## 2020-10-02 MED ORDER — ONDANSETRON 4 MG PO TBDP
4.0000 mg | ORAL_TABLET | Freq: Three times a day (TID) | ORAL | 0 refills | Status: DC | PRN
Start: 2020-10-02 — End: 2021-01-18

## 2020-10-02 MED ORDER — PANTOPRAZOLE SODIUM 40 MG IV SOLR
40.0000 mg | Freq: Once | INTRAVENOUS | Status: AC
  Administered 2020-10-02: 40 mg via INTRAVENOUS
  Filled 2020-10-02: qty 40

## 2020-10-02 MED ORDER — ONDANSETRON HCL 4 MG/2ML IJ SOLN
4.0000 mg | Freq: Once | INTRAMUSCULAR | Status: AC
  Administered 2020-10-02: 4 mg via INTRAVENOUS
  Filled 2020-10-02: qty 2

## 2020-10-02 MED ORDER — ONDANSETRON 4 MG PO TBDP
4.0000 mg | ORAL_TABLET | Freq: Once | ORAL | Status: AC
  Administered 2020-10-02: 4 mg via ORAL

## 2020-10-02 MED ORDER — ONDANSETRON 4 MG PO TBDP
ORAL_TABLET | ORAL | Status: AC
  Filled 2020-10-02: qty 1

## 2020-10-02 MED ORDER — LACTATED RINGERS IV BOLUS
1000.0000 mL | Freq: Once | INTRAVENOUS | Status: AC
  Administered 2020-10-02: 1000 mL via INTRAVENOUS

## 2020-10-02 NOTE — ED Provider Notes (Signed)
[facility] Emergency Department Provider Note  ____________________________________________  Time seen: Approximately 2:03 AM  I have reviewed the triage vital signs and the nursing notes.   HISTORY  Chief Complaint Abdominal Pain and Chest Pain   HPI Felicia Jones is a 25 y.o. female G2P1 GA [redacted]w[redacted]d who presents for evaluation of abdominal pain. Patient reports that she woke up this morning feeling very nauseous. She feels bloated and is complaining of epigastric abd pain that she describes as pressure. The pain got worse after she had chick-fil-a tonight. Has had severe nausea but no vomiting. No chest pain, shortness of breath, cough, fever.  She reports having 3 bowel movements today.  Denies constipation or diarrhea.  Denies vaginal bleeding, uterine contractions, dysuria or hematuria.  She does report that her 59-year-old had nausea and vomiting 2 days ago.   Past Medical History:  Diagnosis Date  . Arthritis    in back  . Migraines     Patient Active Problem List   Diagnosis Date Noted  . Obesity (BMI 30.0-34.9) 06/16/2020  . Maternal obesity affecting pregnancy, antepartum 06/16/2020  . Encounter for supervision of other normal pregnancy, second trimester 06/16/2020  . Bilateral low back pain without sciatica 01/28/2016    Past Surgical History:  Procedure Laterality Date  . WISDOM TOOTH EXTRACTION      Prior to Admission medications   Medication Sig Start Date End Date Taking? Authorizing Provider  ondansetron (ZOFRAN ODT) 4 MG disintegrating tablet Take 1 tablet (4 mg total) by mouth every 8 (eight) hours as needed. 10/02/20  Yes Seena Face, [place], [place]  butalbital-acetaminophen-caffeine [facility]) [phone] MG tablet Take 1 tablet by mouth every 6 (six) hours as needed for headache. 09/15/20 09/15/21  Conard Novak, MD  Doxylamine-Pyridoxine (DICLEGIS) 10-10 MG TBEC Take 2 tablets by mouth at bedtime. If symptoms persist, add one tablet in  the morning and one in the afternoon 06/16/20   Veal, Konrad Felix, CNM  Prenat w/o A-[id number]-[id number] (CITRANATAL ASSURE) 35-1 & 300 MG tablet Take 2 tablets by mouth daily. 07/21/20   Zipporah Plants, CNM    Allergies Patient has no known allergies.  Family History  Problem Relation Age of Onset  . Migraines Mother   . Diabetes Maternal Grandmother   . Hypertension Maternal Grandmother   . Diabetes Maternal Grandfather   . Hypertension Maternal Grandfather     Social History Social History   Tobacco Use  . Smoking status: Never Smoker  . Smokeless tobacco: Never Used  Vaping Use  . Vaping Use: Never used  Substance Use Topics  . Alcohol use: No  . Drug use: No    Review of Systems  Constitutional: Negative for fever. Eyes: Negative for visual changes. ENT: Negative for sore throat. Neck: No neck pain  Cardiovascular: Negative for chest pain. Respiratory: Negative for shortness of breath. Gastrointestinal: + epigastric abdominal pain and nausea. No vomiting or diarrhea. Genitourinary: Negative for dysuria. Musculoskeletal: Negative for back pain. Skin: Negative for rash. Neurological: Negative for headaches, weakness or numbness. Psych: No SI or HI  ____________________________________________   PHYSICAL EXAM:  VITAL SIGNS: ED Triage Vitals  Enc Vitals Group     BP 10/01/20 2346 (!) 107/48     Pulse Rate 10/01/20 2346 86     Resp 10/01/20 2346 18     Temp 10/01/20 2346 98.4 F (36.9 C)     Temp Source 10/01/20 2346 Oral     SpO2 10/01/20 2346 99 %  Weight 10/01/20 2347 180 lb (81.6 kg)     Height 10/01/20 2347 5' (1.524 m)     Head Circumference --      Peak Flow --      Pain Score 10/01/20 2347 7     Pain Loc --      Pain Edu? --      Excl. in GC? --     Constitutional: Alert and oriented. Well appearing and in no apparent distress. HEENT:      Head: Normocephalic and atraumatic.         Eyes: Conjunctivae are normal. Sclera is non-icteric.        Mouth/Throat: Mucous membranes are moist.       Neck: Supple with no signs of meningismus. Cardiovascular: Regular rate and rhythm. No murmurs, gallops, or rubs. 2+ symmetrical distal pulses are present in all extremities. No JVD. Respiratory: Normal respiratory effort. Lungs are clear to auscultation bilaterally.  Gastrointestinal: Gravid, tender to palpation over the epigastric and LUQ, no rebound or guarding. Normal bowel sounds Genitourinary: No CVA tenderness. Musculoskeletal:  No edema, cyanosis, or erythema of extremities. Neurologic: Normal speech and language. Face is symmetric. Moving all extremities. No gross focal neurologic deficits are appreciated. Skin: Skin is warm, dry and intact. No rash noted. Psychiatric: Mood and affect are normal. Speech and behavior are normal.  ____________________________________________   LABS (all labs ordered are listed, but only abnormal results are displayed)  Labs Reviewed  COMPREHENSIVE METABOLIC PANEL - Abnormal; Notable for the following components:      Result Value   Calcium 7.9 (*)    Total Protein 6.1 (*)    Albumin 2.9 (*)    All other components within normal limits  CBC - Abnormal; Notable for the following components:   WBC 10.7 (*)    Hemoglobin 11.5 (*)    HCT 35.1 (*)    All other components within normal limits  URINALYSIS, COMPLETE (UACMP) WITH MICROSCOPIC - Abnormal; Notable for the following components:   Color, Urine YELLOW (*)    APPearance HAZY (*)    All other components within normal limits  LIPASE, BLOOD  TROPONIN I (HIGH SENSITIVITY)  TROPONIN I (HIGH SENSITIVITY)   ____________________________________________  EKG  ED ECG REPORT I, Nita Sickle, the attending physician, personally viewed and interpreted this ECG.  Normal sinus rhythm, rate of 80, normal intervals, normal axis, no ST elevations or depressions.  Normal EKG. ____________________________________________  RADIOLOGY  I have  personally reviewed the images performed during this visit and I agree with the Radiologist's read.   Interpretation by Radiologist:  US ABDOMEN LIMITED RUQ (LIVER/GB)  Result Date: 10/02/2020 CLINICAL DATA:  Right upper quadrant pain EXAM: ULTRASOUND ABDOMEN LIMITED RIGHT UPPER QUADRANT COMPARISON:  None. FINDINGS: Gallbladder: No gallstones or wall thickening visualized. No sonographic Murphy sign noted by sonographer. Common bile duct: Diameter: Normal caliber, 3 mm Liver: No focal lesion identified. Within normal limits in parenchymal echogenicity. Portal vein is patent on color Doppler imaging with normal direction of blood flow towards the liver. Other: None. IMPRESSION: Negative. Electronically Signed   By: Charlett Nose M.D.   On: 10/02/2020 03:57     ____________________________________________   PROCEDURES  Procedure(s) performed: None Procedures Critical Care performed:  None ____________________________________________   INITIAL IMPRESSION / ASSESSMENT AND PLAN / ED COURSE   25 y.o. female G2P1 GA [redacted]w[redacted]d who presents for evaluation of nausea, bloating, epigastric dull abdominal pain since this am, worse post-prandially. No vomiting, diarrhea, constipation, CP,  or SOB. Son with similar symptoms 2 days ago.  Patient is extremely well-appearing in no distress with normal vital signs, abdomen is gravid with mild epigastric and left upper quadrant tenderness, positive bowel sounds, no rebound or guarding.  Patient has no tenderness on the right side of her abdomen.  Ddx indigestion/ gerd, gastritis, gastroenteritis, pancreatitis, gallbladder pathology, constipation, PUD, DKA.  Doubt PE with no chest pain or shortness of breath, tachypnea, tachycardia, or hypoxia.  Doubt appendicitis with no tenderness on the right side.  POCUS showing good fetal movement with FHR 162.  Labs show normal LFTs, normal lipase, no electrolyte derangements, normal glucose, minimally elevated white count of  10.7.  EKG and troponin are negative.  Patient given IV Protonix, Tylenol and Zofran.  Will reassess.  _________________________ 4:08 AM on 10/02/2020 -----------------------------------------  Right upper quadrant ultrasound showed normal gallbladder.  Patient feels markedly improved, tolerating p.o. with no further episodes of nausea.  No vomiting.  Abdomen is soft and nontender.  Will discharge home with supportive care, bland diet, Zofran, and follow-up with OB.  Discussed my standard return precautions for recurrence of the pain, fever, or vomiting.     _____________________________________________ Please note:  Patient was evaluated in Emergency Department today for the symptoms described in the history of present illness. Patient was evaluated in the context of the global COVID-19 pandemic, which necessitated consideration that the patient might be at risk for infection with the SARS-CoV-2 virus that causes COVID-19. Institutional protocols and algorithms that pertain to the evaluation of patients at risk for COVID-19 are in a state of rapid change based on information released by regulatory bodies including the CDC and federal and state organizations. These policies and algorithms were followed during the patient's care in the ED.  Some ED evaluations and interventions may be delayed as a result of limited staffing during the pandemic.   [place] Controlled Substance Database was reviewed by me. ____________________________________________   FINAL CLINICAL IMPRESSION(S) / ED DIAGNOSES   Final diagnoses:  Epigastric abdominal pain      NEW MEDICATIONS STARTED DURING THIS VISIT:  ED Discharge Orders         Ordered    ondansetron (ZOFRAN ODT) 4 MG disintegrating tablet  Every 8 hours PRN        10/02/20 0407           Note:  This document was prepared using Dragon voice recognition software and may include unintentional dictation errors.    Don Perking, [place], MD 10/02/20  [phone]

## 2020-10-02 NOTE — Discharge Instructions (Signed)

## 2020-10-04 ENCOUNTER — Other Ambulatory Visit: Payer: Self-pay

## 2020-10-04 ENCOUNTER — Encounter: Payer: Medicaid Other | Admitting: Obstetrics and Gynecology

## 2020-10-05 ENCOUNTER — Encounter: Payer: Medicaid Other | Admitting: Obstetrics and Gynecology

## 2020-10-13 ENCOUNTER — Encounter: Payer: Self-pay | Admitting: Obstetrics and Gynecology

## 2020-10-13 ENCOUNTER — Ambulatory Visit (INDEPENDENT_AMBULATORY_CARE_PROVIDER_SITE_OTHER): Payer: Medicaid Other | Admitting: Obstetrics and Gynecology

## 2020-10-13 ENCOUNTER — Other Ambulatory Visit: Payer: Self-pay

## 2020-10-13 VITALS — BP 124/70 | Wt 187.0 lb

## 2020-10-13 DIAGNOSIS — Z3482 Encounter for supervision of other normal pregnancy, second trimester: Secondary | ICD-10-CM

## 2020-10-13 DIAGNOSIS — Z3A24 24 weeks gestation of pregnancy: Secondary | ICD-10-CM

## 2020-10-13 DIAGNOSIS — Z131 Encounter for screening for diabetes mellitus: Secondary | ICD-10-CM

## 2020-10-13 DIAGNOSIS — Z113 Encounter for screening for infections with a predominantly sexual mode of transmission: Secondary | ICD-10-CM

## 2020-10-13 DIAGNOSIS — O99210 Obesity complicating pregnancy, unspecified trimester: Secondary | ICD-10-CM

## 2020-10-13 NOTE — Progress Notes (Signed)
Routine Prenatal Care Visit  Subjective  Felicia Jones is a 25 y.o. G2P1001 at [redacted]w[redacted]d being seen today for ongoing prenatal care.  She is currently monitored for the following issues for this low-risk pregnancy and has Bilateral low back pain without sciatica; Obesity (BMI 30.0-34.9); Maternal obesity affecting pregnancy, antepartum; and Encounter for supervision of other normal pregnancy, second trimester on their problem list.  ----------------------------------------------------------------------------------- Patient reports no complaints.   Contractions: Not present. Vag. Bleeding: None.  Movement: Present. Leaking Fluid denies.  ----------------------------------------------------------------------------------- The following portions of the patient's history were reviewed and updated as appropriate: allergies, current medications, past family history, past medical history, past social history, past surgical history and problem list. Problem list updated.  Objective  Blood pressure 124/70, weight 187 lb (84.8 kg), unknown if currently breastfeeding. Pregravid weight 174 lb (78.9 kg) Total Weight Gain 13 lb (5.897 kg) Urinalysis: Urine Protein    Urine Glucose    Fetal Status: Fetal Heart Rate (bpm): 150 Fundal Height: 25 cm Movement: Present     General:  Alert, oriented and cooperative. Patient is in no acute distress.  Skin: Skin is warm and dry. No rash noted.   Cardiovascular: Normal heart rate noted  Respiratory: Normal respiratory effort, no problems with respiration noted  Abdomen: Soft, gravid, appropriate for gestational age. Pain/Pressure: Absent     Pelvic:  Cervical exam deferred        Extremities: Normal range of motion.     Mental Status: Normal mood and affect. Normal behavior. Normal judgment and thought content.   Assessment   25 y.o. G2P1001 at [redacted]w[redacted]d by  01/31/2021, by Ultrasound presenting for routine prenatal visit  Plan   pregnancy 2 Problems (from 06/16/20 to  present)    Problem Noted Resolved   Obesity (BMI 30.0-34.9) 06/16/2020 by Zipporah Plants, CNM No   Maternal obesity affecting pregnancy, antepartum 06/16/2020 by Zipporah Plants, CNM No   Encounter for supervision of other normal pregnancy, second trimester 06/16/2020 by Zipporah Plants, CNM No   Overview Addendum 07/28/2020  8:31 AM by Zipporah Plants, CNM     Nursing Staff Provider  Office Location  [facility] Dating   9w [place]  Language  English Anatomy US    Flu Vaccine   declined Genetic Screen  NIPS: neg x 3, XY   TDaP vaccine    Hgb A1C or  GTT Early : 105 Third trimester :   Rhogam   n/a   LAB RESULTS   Feeding Plan  breast Blood Type B/Positive/-- (12/15 1603)   Contraception  Antibody Negative (12/15 1603)  Circumcision  Rubella 2.28 (12/15 1603)  Pediatrician   RPR Non Reactive (12/15 1603)   Support Person  Dequan HBsAg Negative (12/15 1603)   Prenatal Classes  HIV Non Reactive (12/15 1603)    Varicella  immune  BTL Consent  GBS  (For PCN allergy, check sensitivities)        VBAC Consent  n/a Pap  06/15/20 NILM    Hgb Electro   normal hgb    CF  negative     SMA  negative               Previous Version       Preterm labor symptoms and general obstetric precautions including but not limited to vaginal bleeding, contractions, leaking of fluid and fetal movement were reviewed in detail with the patient. Please refer to After Visit Summary for other counseling recommendations.   Return in about 4 weeks (around  11/10/2020) for 28 week labs with Routine Prenatal Appointment.   Thomasene Mohair, MD, [facility], [facility] 10/13/2020 10:31 AM

## 2020-10-20 ENCOUNTER — Other Ambulatory Visit: Payer: Self-pay

## 2020-10-20 ENCOUNTER — Ambulatory Visit (INDEPENDENT_AMBULATORY_CARE_PROVIDER_SITE_OTHER): Payer: Medicaid Other | Admitting: Obstetrics

## 2020-10-20 VITALS — BP 120/80 | Wt 189.0 lb

## 2020-10-20 DIAGNOSIS — O99210 Obesity complicating pregnancy, unspecified trimester: Secondary | ICD-10-CM

## 2020-10-20 DIAGNOSIS — Z3A25 25 weeks gestation of pregnancy: Secondary | ICD-10-CM

## 2020-10-20 DIAGNOSIS — Z3482 Encounter for supervision of other normal pregnancy, second trimester: Secondary | ICD-10-CM

## 2020-10-20 NOTE — Progress Notes (Signed)
Routine Prenatal Care Visit  Subjective  Felicia Jones is a 25 y.o. G2P1001 at [redacted]w[redacted]d being seen today for ongoing prenatal care.  She is currently monitored for the following issues for this high-risk pregnancy and has Bilateral low back pain without sciatica; Obesity (BMI 30.0-34.9); Maternal obesity affecting pregnancy, antepartum; and Encounter for supervision of other normal pregnancy, second trimester on their problem list.  ----------------------------------------------------------------------------------- Patient reports no contractions, no leaking and she feels like the baby is not moving as muchl she reports decreaseed fetal movement..  She also has some intermittent back strain. Contractions: Not present. Vag. Bleeding: None.  Movement: (!) Decreased. Leaking Fluid denies.  ----------------------------------------------------------------------------------- The following portions of the patient's history were reviewed and updated as appropriate: allergies, current medications, past family history, past medical history, past social history, past surgical history and problem list. Problem list updated.  Objective  Blood pressure 120/80, weight 189 lb (85.7 kg), unknown if currently breastfeeding. Pregravid weight 174 lb (78.9 kg) Total Weight Gain 15 lb (6.804 kg) Urinalysis: Urine Protein    Urine Glucose    Fetal Status:     Movement: (!) Decreased     General:  Alert, oriented and cooperative. Patient is in no acute distress.  Skin: Skin is warm and dry. No rash noted.   Cardiovascular: Normal heart rate noted  Respiratory: Normal respiratory effort, no problems with respiration noted  Abdomen: Soft, gravid, appropriate for gestational age. Pain/Pressure: Present     Pelvic:  Cervical exam deferred        Extremities: Normal range of motion.     Mental Status: Normal mood and affect. Normal behavior. Normal judgment and thought content.   Assessment   25 y.o. G2P1001 at [redacted]w[redacted]d  by  01/31/2021, by Ultrasound presenting for routine prenatal visit  Plan   pregnancy 2 Problems (from 06/16/20 to present)    Problem Noted Resolved   Obesity (BMI 30.0-34.9) 06/16/2020 by Zipporah Plants, CNM No   Maternal obesity affecting pregnancy, antepartum 06/16/2020 by Zipporah Plants, CNM No   Encounter for supervision of other normal pregnancy, second trimester 06/16/2020 by Zipporah Plants, CNM No   Overview Addendum 07/28/2020  8:31 AM by Zipporah Plants, CNM     Nursing Staff Provider  Office Location  [facility] Dating   9w [place]  Language  English Anatomy US    Flu Vaccine   declined Genetic Screen  NIPS: neg x 3, XY   TDaP vaccine    Hgb A1C or  GTT Early : 105 Third trimester :   Rhogam   n/a   LAB RESULTS   Feeding Plan  breast Blood Type B/Positive/-- (12/15 1603)   Contraception  Antibody Negative (12/15 1603)  Circumcision  Rubella 2.28 (12/15 1603)  Pediatrician   RPR Non Reactive (12/15 1603)   Support Person  Dequan HBsAg Negative (12/15 1603)   Prenatal Classes  HIV Non Reactive (12/15 1603)    Varicella  immune  BTL Consent  GBS  (For PCN allergy, check sensitivities)        VBAC Consent  n/a Pap  06/15/20 NILM    Hgb Electro   normal hgb    CF  negative     SMA  negative               Previous Version       Preterm labor symptoms and general obstetric precautions including but not limited to vaginal bleeding, contractions, leaking of fluid and fetal movement were reviewed in detail  with the patient. Please refer to After Visit Summary for other counseling recommendations.  Several visible and via doppler, audible fetal movements during the visit today. She acknowledges adequate movement. Maternity belt advised for her ongoing back pain.  Return in about 3 weeks (around 11/10/2020) for return OB and 28 wk labs.  Mirna Mires, CNM  10/20/2020 9:44 AM

## 2020-11-10 ENCOUNTER — Other Ambulatory Visit: Payer: Medicaid Other

## 2020-11-10 ENCOUNTER — Ambulatory Visit (INDEPENDENT_AMBULATORY_CARE_PROVIDER_SITE_OTHER): Payer: Medicaid Other | Admitting: Obstetrics and Gynecology

## 2020-11-10 ENCOUNTER — Encounter: Payer: Self-pay | Admitting: Obstetrics and Gynecology

## 2020-11-10 ENCOUNTER — Other Ambulatory Visit: Payer: Self-pay

## 2020-11-10 VITALS — BP 118/78 | Wt 194.0 lb

## 2020-11-10 DIAGNOSIS — Z3A28 28 weeks gestation of pregnancy: Secondary | ICD-10-CM

## 2020-11-10 DIAGNOSIS — Z113 Encounter for screening for infections with a predominantly sexual mode of transmission: Secondary | ICD-10-CM

## 2020-11-10 DIAGNOSIS — Z131 Encounter for screening for diabetes mellitus: Secondary | ICD-10-CM

## 2020-11-10 DIAGNOSIS — Z3482 Encounter for supervision of other normal pregnancy, second trimester: Secondary | ICD-10-CM

## 2020-11-10 DIAGNOSIS — O99210 Obesity complicating pregnancy, unspecified trimester: Secondary | ICD-10-CM

## 2020-11-10 DIAGNOSIS — Z3483 Encounter for supervision of other normal pregnancy, third trimester: Secondary | ICD-10-CM

## 2020-11-10 MED ORDER — PROCHLORPERAZINE MALEATE 10 MG PO TABS: 10.0000 mg | ORAL_TABLET | Freq: Three times a day (TID) | ORAL | 0 refills | Status: DC | PRN

## 2020-11-10 NOTE — Progress Notes (Signed)
Routine Prenatal Care Visit  Subjective  Felicia Jones is a 25 y.o. G2P1001 at [redacted]w[redacted]d being seen today for ongoing prenatal care.  She is currently monitored for the following issues for this high-risk pregnancy and has Bilateral low back pain without sciatica; Obesity (BMI 30.0-34.9); Maternal obesity affecting pregnancy, antepartum; and Encounter for supervision of other normal pregnancy, second trimester on their problem list.  ----------------------------------------------------------------------------------- Patient reports headache.   Contractions: Not present. Vag. Bleeding: None.  Movement: Present. Leaking Fluid denies.  ----------------------------------------------------------------------------------- The following portions of the patient's history were reviewed and updated as appropriate: allergies, current medications, past family history, past medical history, past social history, past surgical history and problem list. Problem list updated.  Objective  Blood pressure 118/78, weight 194 lb (88 kg), unknown if currently breastfeeding. Pregravid weight 174 lb (78.9 kg) Total Weight Gain 20 lb (9.072 kg) Urinalysis: Urine Protein    Urine Glucose    Fetal Status: Fetal Heart Rate (bpm): 155 Fundal Height: 29 cm Movement: Present     General:  Alert, oriented and cooperative. Patient is in no acute distress.  Skin: Skin is warm and dry. No rash noted.   Cardiovascular: Normal heart rate noted  Respiratory: Normal respiratory effort, no problems with respiration noted  Abdomen: Soft, gravid, appropriate for gestational age. Pain/Pressure: Absent     Pelvic:  Cervical exam deferred        Extremities: Normal range of motion.     Mental Status: Normal mood and affect. Normal behavior. Normal judgment and thought content.   Assessment   25 y.o. G2P1001 at [redacted]w[redacted]d by  01/31/2021, by Ultrasound presenting for routine prenatal visit  Plan   pregnancy 2 Problems (from 06/16/20 to  present)    Problem Noted Resolved   Obesity (BMI 30.0-34.9) 06/16/2020 by Zipporah Plants, CNM No   Maternal obesity affecting pregnancy, antepartum 06/16/2020 by Zipporah Plants, CNM No   Encounter for supervision of other normal pregnancy, second trimester 06/16/2020 by Zipporah Plants, CNM No   Overview Addendum 07/28/2020  8:31 AM by Zipporah Plants, CNM     Nursing Staff Provider  Office Location  [facility] Dating   9w [place]  Language  English Anatomy US    Flu Vaccine   declined Genetic Screen  NIPS: neg x 3, XY   TDaP vaccine    Hgb A1C or  GTT Early : 105 Third trimester :   Rhogam   n/a   LAB RESULTS   Feeding Plan  breast Blood Type B/Positive/-- (12/15 1603)   Contraception  Antibody Negative (12/15 1603)  Circumcision  Rubella 2.28 (12/15 1603)  Pediatrician   RPR Non Reactive (12/15 1603)   Support Person  Dequan HBsAg Negative (12/15 1603)   Prenatal Classes  HIV Non Reactive (12/15 1603)    Varicella  immune  BTL Consent  GBS  (For PCN allergy, check sensitivities)        VBAC Consent  n/a Pap  06/15/20 NILM    Hgb Electro   normal hgb    CF  negative     SMA  negative               Previous Version       Preterm labor symptoms and general obstetric precautions including but not limited to vaginal bleeding, contractions, leaking of fluid and fetal movement were reviewed in detail with the patient. Please refer to After Visit Summary for other counseling recommendations.   - 28 week labs today  Return  in about 2 weeks (around 11/24/2020) for Routine Prenatal Appointment.   Thomasene Mohair, MD, [facility], [facility] 11/10/2020 10:19 AM

## 2020-11-15 LAB — 28 WEEK RH+PANEL
Basophils Absolute: 0 10*3/uL (ref 0.0–0.2)
Basos: 0 %
EOS (ABSOLUTE): 0 10*3/uL (ref 0.0–0.4)
Eos: 0 %
Gestational Diabetes Screen: 117 mg/dL (ref 65–139)
HIV Screen 4th Generation wRfx: NONREACTIVE
Hematocrit: 33 % — ABNORMAL LOW (ref 34.0–46.6)
Hemoglobin: 10.9 g/dL — ABNORMAL LOW (ref 11.1–15.9)
Immature Grans (Abs): 0.1 10*3/uL (ref 0.0–0.1)
Immature Granulocytes: 1 %
Lymphocytes Absolute: 2.2 10*3/uL (ref 0.7–3.1)
Lymphs: 22 %
MCH: 27.3 pg (ref 26.6–33.0)
MCHC: 33 g/dL (ref 31.5–35.7)
MCV: 83 fL (ref 79–97)
Monocytes Absolute: 0.6 10*3/uL (ref 0.1–0.9)
Monocytes: 6 %
Neutrophils Absolute: 6.9 10*3/uL (ref 1.4–7.0)
Neutrophils: 71 %
Platelets: 242 10*3/uL (ref 150–450)
RBC: 4 x10E6/uL (ref 3.77–5.28)
RDW: 13.1 % (ref 11.7–15.4)
RPR Ser Ql: NONREACTIVE
WBC: 9.9 10*3/uL (ref 3.4–10.8)

## 2020-11-16 ENCOUNTER — Ambulatory Visit: Payer: Medicaid Other

## 2020-11-16 ENCOUNTER — Telehealth: Payer: Self-pay

## 2020-11-16 NOTE — Telephone Encounter (Signed)
Pt called triage reporting pelvic pain and pressure with urination, she is going to drop off a urine sample so we can check for a uti.

## 2020-11-24 ENCOUNTER — Encounter: Payer: Self-pay | Admitting: Obstetrics and Gynecology

## 2020-11-24 ENCOUNTER — Other Ambulatory Visit: Payer: Self-pay

## 2020-11-24 ENCOUNTER — Ambulatory Visit (INDEPENDENT_AMBULATORY_CARE_PROVIDER_SITE_OTHER): Payer: Medicaid Other | Admitting: Obstetrics and Gynecology

## 2020-11-24 VITALS — BP 122/72 | Ht 60.0 in | Wt 198.2 lb

## 2020-11-24 DIAGNOSIS — Z3A30 30 weeks gestation of pregnancy: Secondary | ICD-10-CM

## 2020-11-24 DIAGNOSIS — E669 Obesity, unspecified: Secondary | ICD-10-CM

## 2020-11-24 DIAGNOSIS — O99210 Obesity complicating pregnancy, unspecified trimester: Secondary | ICD-10-CM

## 2020-11-24 DIAGNOSIS — O99213 Obesity complicating pregnancy, third trimester: Secondary | ICD-10-CM

## 2020-11-24 DIAGNOSIS — Z3483 Encounter for supervision of other normal pregnancy, third trimester: Secondary | ICD-10-CM

## 2020-11-24 DIAGNOSIS — Z23 Encounter for immunization: Secondary | ICD-10-CM

## 2020-11-24 DIAGNOSIS — O99891 Other specified diseases and conditions complicating pregnancy: Secondary | ICD-10-CM

## 2020-11-24 DIAGNOSIS — M549 Dorsalgia, unspecified: Secondary | ICD-10-CM

## 2020-11-24 DIAGNOSIS — O26843 Uterine size-date discrepancy, third trimester: Secondary | ICD-10-CM

## 2020-11-24 DIAGNOSIS — M545 Low back pain, unspecified: Secondary | ICD-10-CM

## 2020-11-24 NOTE — Progress Notes (Signed)
Routine Prenatal Care Visit  Subjective  Felicia Jones is a 25 y.o. G2P1001 at [redacted]w[redacted]d being seen today for ongoing prenatal care.  She is currently monitored for the following issues for this low-risk pregnancy and has Bilateral low back pain without sciatica; Obesity (BMI 30.0-34.9); Maternal obesity affecting pregnancy, antepartum; and Encounter for supervision of other normal pregnancy, third trimester on their problem list.  ----------------------------------------------------------------------------------- Patient reports backache.  She has not found a comfortable maternity belt.  Contractions: Not present. Vag. Bleeding: None.  Movement: Present. Denies leaking of fluid.  ----------------------------------------------------------------------------------- The following portions of the patient's history were reviewed and updated as appropriate: allergies, current medications, past family history, past medical history, past social history, past surgical history and problem list. Problem list updated.   Objective  Blood pressure 122/72, height 5' (1.524 m), weight 198 lb 3.2 oz (89.9 kg), unknown if currently breastfeeding. Pregravid weight 174 lb (78.9 kg) Total Weight Gain 24 lb 3.2 oz (11 kg) Urinalysis:      Fetal Status: Fetal Heart Rate (bpm): 150 Fundal Height: 34 cm Movement: Present     General:  Alert, oriented and cooperative. Patient is in no acute distress.  Skin: Skin is warm and dry. No rash noted.   Cardiovascular: Normal heart rate noted  Respiratory: Normal respiratory effort, no problems with respiration noted  Abdomen: Soft, gravid, appropriate for gestational age. Pain/Pressure: Absent     Pelvic:  Cervical exam deferred        Extremities: Normal range of motion.     Mental Status: Normal mood and affect. Normal behavior. Normal judgment and thought content.     Assessment   25 y.o. G2P1001 at [redacted]w[redacted]d by  01/31/2021, by Ultrasound presenting for routine prenatal  visit  Plan   pregnancy 2 Problems (from 06/16/20 to present)    Problem Noted Resolved   Obesity (BMI 30.0-34.9) 06/16/2020 by Zipporah Plants, CNM No   Maternal obesity affecting pregnancy, antepartum 06/16/2020 by Zipporah Plants, CNM No   Encounter for supervision of other normal pregnancy, third trimester 06/16/2020 by Zipporah Plants, CNM No   Overview Addendum 11/24/2020  9:17 AM by Natale Milch, MD     Nursing Staff Provider  Office Location  [facility] Dating   9w [place]  Language  English Anatomy US  complete  Flu Vaccine   declined Genetic Screen  NIPS: neg x 3, XY   TDaP vaccine   11/24/2020 Hgb A1C or  GTT Early : 105 Third trimester : 117  Rhogam   n/a   LAB RESULTS   Feeding Plan  breast Blood Type B/Positive/-- (12/15 1603)   Contraception  Antibody Negative (12/15 1603)  Circumcision  Rubella 2.28 (12/15 1603)  Pediatrician   RPR Non Reactive (12/15 1603)   Support Person  Dequan HBsAg Negative (12/15 1603)   Prenatal Classes  HIV Non Reactive (12/15 1603)    Varicella  immune  BTL Consent  GBS  (For PCN allergy, check sensitivities)        VBAC Consent  n/a Pap  06/15/20 NILM    Hgb Electro   normal hgb    CF  negative     SMA  negative               Previous Version       Discussed contraception options. Discussed that patch is less effective with a BMI over 30.  Discussed Nuva ring as an alternative PT referral for lower back and pelvic pain  in pregnancy Growth [place] for size date discrepancy and Obesity in pregnancy  Gestational age appropriate obstetric precautions including but not limited to vaginal bleeding, contractions, leaking of fluid and fetal movement were reviewed in detail with the patient.    Return in about 2 weeks (around 12/08/2020) for ROB.  Natale Milch MD [facility], [facility] 11/24/2020, 9:40 AM

## 2020-11-24 NOTE — Patient Instructions (Signed)

## 2020-12-08 ENCOUNTER — Ambulatory Visit (INDEPENDENT_AMBULATORY_CARE_PROVIDER_SITE_OTHER): Payer: Medicaid Other | Admitting: Obstetrics and Gynecology

## 2020-12-08 ENCOUNTER — Encounter: Payer: Self-pay | Admitting: Obstetrics and Gynecology

## 2020-12-08 ENCOUNTER — Other Ambulatory Visit: Payer: Self-pay

## 2020-12-08 VITALS — BP 110/70 | Ht 60.0 in | Wt 200.8 lb

## 2020-12-08 DIAGNOSIS — Z3483 Encounter for supervision of other normal pregnancy, third trimester: Secondary | ICD-10-CM

## 2020-12-08 DIAGNOSIS — Z3A32 32 weeks gestation of pregnancy: Secondary | ICD-10-CM

## 2020-12-08 LAB — POCT URINALYSIS DIPSTICK OB
Glucose, UA: NEGATIVE
POC,PROTEIN,UA: NEGATIVE

## 2020-12-08 NOTE — Patient Instructions (Addendum)
[facility] [phone]    Third Trimester of Pregnancy  The third trimester of pregnancy is from week 28 through week 40. This is months 7 through 9. The third trimester is a time when the unborn baby (fetus) is growing rapidly. At the end of the ninth month, the fetus is about 20 inches long and weighs 6-10 pounds. Body changes during your third trimester During the third trimester, your body will continue to go through many changes. The changes vary and generally return to normal after your baby is born. Physical changes  Your weight will continue to increase. You can expect to gain 25-35 pounds (11-16 kg) by the end of the pregnancy if you begin pregnancy at a normal weight. If you are underweight, you can expect to gain 28-40 lb (about 13-18 kg), and if you are overweight, you can expect to gain 15-25 lb (about 7-11 kg).  You may begin to get stretch marks on your hips, abdomen, and breasts.  Your breasts will continue to grow and may hurt. A yellow fluid (colostrum) may leak from your breasts. This is the first milk you are producing for your baby.  You may have changes in your hair. These can include thickening of your hair, rapid growth, and changes in texture. Some people also have hair loss during or after pregnancy, or hair that feels dry or thin.  Your belly button may stick out.  You may notice more swelling in your hands, face, or ankles. Health changes  You may have heartburn.  You may have constipation.  You may develop hemorrhoids.  You may develop swollen, bulging veins (varicose veins) in your legs.  You may have increased body aches in the pelvis, back, or thighs. This is due to weight gain and increased hormones that are relaxing your joints.  You may have increased tingling or numbness in your hands, arms, and legs. The skin on your abdomen may also feel numb.  You may feel short of breath because of your expanding uterus. Other changes  You  may urinate more often because the fetus is moving lower into your pelvis and pressing on your bladder.  You may have more problems sleeping. This may be caused by the size of your abdomen, an increased need to urinate, and an increase in your body's metabolism.  You may notice the fetus "dropping," or moving lower in your abdomen (lightening).  You may have increased vaginal discharge.  You may notice that you have pain around your pelvic bone as your uterus distends. Follow these instructions at home: Medicines  Follow your health care provider's instructions regarding medicine use. Specific medicines may be either safe or unsafe to take during pregnancy. Do not take any medicines unless approved by your health care provider.  Take a prenatal vitamin that contains at least 600 micrograms (mcg) of folic acid. Eating and drinking  Eat a healthy diet that includes fresh fruits and vegetables, whole grains, good sources of protein such as meat, eggs, or tofu, and low-fat dairy products.  Avoid raw meat and unpasteurized juice, milk, and cheese. These carry germs that can harm you and your baby.  Eat 4 or 5 small meals rather than 3 large meals a day.  You may need to take these actions to prevent or treat constipation: ? Drink enough fluid to keep your urine pale yellow. ? Eat foods that are high in fiber, such as beans, whole grains, and fresh fruits and vegetables. ? Limit foods  that are high in fat and processed sugars, such as fried or sweet foods. Activity  Exercise only as directed by your health care provider. Most people can continue their usual exercise routine during pregnancy. Try to exercise for 30 minutes at least 5 days a week. Stop exercising if you experience contractions in the uterus.  Stop exercising if you develop pain or cramping in the lower abdomen or lower back.  Avoid heavy lifting.  Do not exercise if it is very hot or humid or if you are at a high  altitude.  If you choose to, you may continue to have sex unless your health care provider tells you not to. Relieving pain and discomfort  Take frequent breaks and rest with your legs raised (elevated) if you have leg cramps or low back pain.  Take warm sitz baths to soothe any pain or discomfort caused by hemorrhoids. Use hemorrhoid cream if your health care provider approves.  Wear a supportive bra to prevent discomfort from breast tenderness.  If you develop varicose veins: ? Wear support hose as told by your health care provider. ? Elevate your feet for 15 minutes, 3-4 times a day. ? Limit salt in your diet. Safety  Talk to your health care provider before traveling far distances.  Do not use hot tubs, steam rooms, or saunas.  Wear your seat belt at all times when driving or riding in a car.  Talk with your health care provider if someone is verbally or physically abusive to you. Preparing for birth To prepare for the arrival of your baby:  Take prenatal classes to understand, practice, and ask questions about labor and delivery.  Visit the hospital and tour the maternity area.  Purchase a rear-facing car seat and make sure you know how to install it in your car.  Prepare the baby's room or sleeping area. Make sure to remove all pillows and stuffed animals from the baby's crib to prevent suffocation. General instructions  Avoid cat litter boxes and soil used by cats. These carry germs that can cause birth defects in the baby. If you have a cat, ask someone to clean the litter box for you.  Do not douche or use tampons. Do not use scented sanitary pads.  Do not use any products that contain nicotine or tobacco, such as cigarettes, e-cigarettes, and chewing tobacco. If you need help quitting, ask your health care provider.  Do not use any herbal remedies, illegal drugs, or medicines that were not prescribed to you. Chemicals in these products can harm your baby.  Do not  drink alcohol.  You will have more frequent prenatal exams during the third trimester. During a routine prenatal visit, your health care provider will do a physical exam, perform tests, and discuss your overall health. Keep all follow-up visits. This is important. Where to find more information  American Pregnancy Association: americanpregnancy.org  [employer]: https://www.todd-brady.net/  Office on [employer] [facility]: MightyReward[employer].nz Contact a health care provider if you have:  A fever.  Mild pelvic cramps, pelvic pressure, or nagging pain in your abdominal area or lower back.  Vomiting or diarrhea.  Bad-smelling vaginal discharge or foul-smelling urine.  Pain when you urinate.  A headache that does not go away when you take medicine.  Visual changes or see spots in front of your eyes. Get help right away if:  Your water breaks.  You have regular contractions less than 5 minutes apart.  You have spotting or  bleeding from your vagina.  You have severe abdominal pain.  You have difficulty breathing.  You have chest pain.  You have fainting spells.  You have not felt your baby move for the time period told by your health care provider.  You have new or increased pain, swelling, or redness in an arm or leg. Summary  The third trimester of pregnancy is from week 28 through week 40 (months 7 through 9).  You may have more problems sleeping. This can be caused by the size of your abdomen, an increased need to urinate, and an increase in your body's metabolism.  You will have more frequent prenatal exams during the third trimester. Keep all follow-up visits. This is important. This information is not intended to replace advice given to you by your health care provider. Make sure you discuss any questions you have with your health care provider. Document Revised: 11/26/2019 Document Reviewed: 10/02/2019 Elsevier  Patient Education  2021 [employer].

## 2020-12-08 NOTE — Progress Notes (Signed)
Routine Prenatal Care Visit  Subjective  Felicia Jones is a 25 y.o. G2P1001 at [redacted]w[redacted]d being seen today for ongoing prenatal care.  She is currently monitored for the following issues for this low-risk pregnancy and has Bilateral low back pain without sciatica; Obesity (BMI 30.0-34.9); Maternal obesity affecting pregnancy, antepartum; and Encounter for supervision of other normal pregnancy, third trimester on their problem list.  ----------------------------------------------------------------------------------- Patient reports she is still experiencing back pain. It has become more sharp. She is also having sharp sudden shooting pains from her vagina upwards. She has not been able to schedule with PT.  Contractions: Not present. Vag. Bleeding: None.  Movement: Present. Denies leaking of fluid.  ----------------------------------------------------------------------------------- The following portions of the patient's history were reviewed and updated as appropriate: allergies, current medications, past family history, past medical history, past social history, past surgical history and problem list. Problem list updated.   Objective  Blood pressure 110/70, height 5' (1.524 m), weight 200 lb 12.8 oz (91.1 kg), unknown if currently breastfeeding. Pregravid weight 174 lb (78.9 kg) Total Weight Gain 26 lb 12.8 oz (12.2 kg) Urinalysis:      Fetal Status: Fetal Heart Rate (bpm): 160   Movement: Present     General:  Alert, oriented and cooperative. Patient is in no acute distress.  Skin: Skin is warm and dry. No rash noted.   Cardiovascular: Normal heart rate noted  Respiratory: Normal respiratory effort, no problems with respiration noted  Abdomen: Soft, gravid, appropriate for gestational age. Pain/Pressure: Present     Pelvic:  Cervical exam deferred        Extremities: Normal range of motion.  Edema: None  Mental Status: Normal mood and affect. Normal behavior. Normal judgment and thought  content.     Assessment   25 y.o. G2P1001 at [redacted]w[redacted]d by  01/31/2021, by Ultrasound presenting for routine prenatal visit  Plan   pregnancy 2 Problems (from 06/16/20 to present)    Problem Noted Resolved   Obesity (BMI 30.0-34.9) 06/16/2020 by Zipporah Plants, CNM No   Maternal obesity affecting pregnancy, antepartum 06/16/2020 by Zipporah Plants, CNM No   Encounter for supervision of other normal pregnancy, third trimester 06/16/2020 by Zipporah Plants, CNM No   Overview Addendum 11/24/2020  9:45 AM by Natale Milch, MD     Nursing Staff Provider  Office Location  [facility] Dating   9w [place]  Language  English Anatomy US  complete  Flu Vaccine   declined Genetic Screen  NIPS: neg x 3, XY   TDaP vaccine   11/24/2020 Hgb A1C or  GTT Early : 105 Third trimester : 117  Rhogam   n/a   LAB RESULTS   Feeding Plan  breast Blood Type B/Positive/-- (12/15 1603)   Contraception  nuvaring vs patch Antibody Negative (12/15 1603)  Circumcision  Rubella 2.28 (12/15 1603)  Pediatrician   RPR Non Reactive (12/15 1603)   Support Person  Dequan HBsAg Negative (12/15 1603)   Prenatal Classes  HIV Non Reactive (12/15 1603)    Varicella  immune  BTL Consent  GBS  (For PCN allergy, check sensitivities)        VBAC Consent  n/a Pap  06/15/20 NILM    Hgb Electro   normal hgb    CF  negative     SMA  negative         Obesity in pregnancy: pregravid BMI: 33   Growth [place]: every 4 weeks Antenatal testing: None (pregravid BMI less than  35)        Previous Version      Growth [place] planned for 6/23 Given phone number for PT to call and schedule.   Gestational age appropriate obstetric precautions including but not limited to vaginal bleeding, contractions, leaking of fluid and fetal movement were reviewed in detail with the patient.    Return in about 2 weeks (around 12/22/2020) for ROB in person.  Natale Milch MD [facility], [facility] 12/08/2020, 10:40 AM

## 2020-12-08 NOTE — Addendum Note (Signed)
Addended by: Clement Husbands A on: 12/08/2020 12:04 PM   Modules accepted: Orders

## 2020-12-20 ENCOUNTER — Telehealth: Payer: Self-pay

## 2020-12-20 NOTE — Telephone Encounter (Signed)
Pt calling; 34 wks; having really really bad pain for about ; doesn't really think it ctxs;; hurts a little to walk, has tried resting and drinking water.  [phone]  Pt states the pain lever has gone from a 9 to a 4; pressure is the same that it has been; the pain was on right side of her back as well.  Exp the diff between true ctxs and B-H ctxs; to go to L&D is has more than four true ctxs in an hour, pain so bad she cannot talk thru it; or vaginal bleeding.  To go to L&D if things gets worse.

## 2020-12-23 ENCOUNTER — Ambulatory Visit
Admission: RE | Admit: 2020-12-23 | Discharge: 2020-12-23 | Disposition: A | Discharge status: Home / Self Care | Payer: Medicaid Other | Source: Ambulatory Visit | Attending: Obstetrics and Gynecology | Admitting: Obstetrics and Gynecology

## 2020-12-23 ENCOUNTER — Other Ambulatory Visit: Payer: Self-pay

## 2020-12-23 DIAGNOSIS — O26843 Uterine size-date discrepancy, third trimester: Secondary | ICD-10-CM | POA: Insufficient documentation

## 2020-12-27 ENCOUNTER — Ambulatory Visit (INDEPENDENT_AMBULATORY_CARE_PROVIDER_SITE_OTHER): Payer: Medicaid Other | Admitting: Obstetrics and Gynecology

## 2020-12-27 ENCOUNTER — Encounter: Payer: Self-pay | Admitting: Obstetrics and Gynecology

## 2020-12-27 ENCOUNTER — Other Ambulatory Visit: Payer: Self-pay

## 2020-12-27 VITALS — BP 124/72 | Ht 60.0 in | Wt 206.4 lb

## 2020-12-27 DIAGNOSIS — Z3A35 35 weeks gestation of pregnancy: Secondary | ICD-10-CM

## 2020-12-27 DIAGNOSIS — Z3483 Encounter for supervision of other normal pregnancy, third trimester: Secondary | ICD-10-CM

## 2020-12-27 DIAGNOSIS — O99210 Obesity complicating pregnancy, unspecified trimester: Secondary | ICD-10-CM

## 2020-12-27 LAB — POCT URINALYSIS DIPSTICK OB: POC,PROTEIN,UA: NEGATIVE

## 2020-12-27 NOTE — Progress Notes (Signed)
Routine Prenatal Care Visit  Subjective  Felicia Jones is a 25 y.o. G2P1001 at [redacted]w[redacted]d being seen today for ongoing prenatal care.  She is currently monitored for the following issues for this high-risk pregnancy and has Bilateral low back pain without sciatica; Obesity (BMI 30.0-34.9); Maternal obesity affecting pregnancy, antepartum; and Encounter for supervision of other normal pregnancy, third trimester on their problem list.  ----------------------------------------------------------------------------------- Patient reports back pain/pelvic pain.   Contractions: Irritability. Vag. Bleeding: None.  Movement: Present. Leaking Fluid denies.  ----------------------------------------------------------------------------------- The following portions of the patient's history were reviewed and updated as appropriate: allergies, current medications, past family history, past medical history, past social history, past surgical history and problem list. Problem list updated.  Objective  Blood pressure 124/72, height 5' (1.524 m), weight 206 lb 6.4 oz (93.6 kg), unknown if currently breastfeeding. Pregravid weight 174 lb (78.9 kg) Total Weight Gain 32 lb 6.4 oz (14.7 kg) Urinalysis: Urine Protein    Urine Glucose    Fetal Status: Fetal Heart Rate (bpm): 140 Fundal Height: 36 cm Movement: Present     General:  Alert, oriented and cooperative. Patient is in no acute distress.  Skin: Skin is warm and dry. No rash noted.   Cardiovascular: Normal heart rate noted  Respiratory: Normal respiratory effort, no problems with respiration noted  Abdomen: Soft, gravid, appropriate for gestational age. Pain/Pressure: Present     Pelvic:  Cervical exam deferred        Extremities: Normal range of motion.  Edema: None  Mental Status: Normal mood and affect. Normal behavior. Normal judgment and thought content.   Assessment   25 y.o. G2P1001 at [redacted]w[redacted]d by  01/31/2021, by Ultrasound presenting for routine prenatal  visit  Plan   pregnancy 2 Problems (from 06/16/20 to present)     Problem Noted Resolved   Obesity (BMI 30.0-34.9) 06/16/2020 by Zipporah Plants, CNM No   Maternal obesity affecting pregnancy, antepartum 06/16/2020 by Zipporah Plants, CNM No   Encounter for supervision of other normal pregnancy, third trimester 06/16/2020 by Zipporah Plants, CNM No   Overview Addendum 11/24/2020  9:45 AM by Natale Milch, MD     Nursing Staff Provider  Office Location  [facility] Dating   9w [place]  Language  English Anatomy US  complete  Flu Vaccine   declined Genetic Screen  NIPS: neg x 3, XY   TDaP vaccine   11/24/2020 Hgb A1C or  GTT Early : 105 Third trimester : 117  Rhogam   n/a   LAB RESULTS   Feeding Plan  breast Blood Type B/Positive/-- (12/15 1603)   Contraception  nuvaring vs patch Antibody Negative (12/15 1603)  Circumcision  Rubella 2.28 (12/15 1603)  Pediatrician   RPR Non Reactive (12/15 1603)   Support Person  Dequan HBsAg Negative (12/15 1603)   Prenatal Classes  HIV Non Reactive (12/15 1603)    Varicella  immune  BTL Consent  GBS  (For PCN allergy, check sensitivities)        VBAC Consent  n/a Pap  06/15/20 NILM    Hgb Electro   normal hgb    CF  negative     SMA  negative        Obesity in pregnancy: pregravid BMI: 33   Growth [place]: every 4 weeks Antenatal testing: None (pregravid BMI less than 35)              Preterm labor symptoms and general obstetric precautions including but not limited to vaginal bleeding,  contractions, leaking of fluid and fetal movement were reviewed in detail with the patient. Please refer to After Visit Summary for other counseling recommendations.   Return in about 1 week (around 01/03/2021) for Routine Prenatal Appointment.   Thomasene Mohair, MD, [facility], [facility] 12/27/2020 11:03 AM

## 2021-01-04 ENCOUNTER — Encounter: Payer: Self-pay | Admitting: Obstetrics and Gynecology

## 2021-01-04 ENCOUNTER — Other Ambulatory Visit: Payer: Self-pay

## 2021-01-04 ENCOUNTER — Ambulatory Visit (INDEPENDENT_AMBULATORY_CARE_PROVIDER_SITE_OTHER): Payer: Medicaid Other | Admitting: Obstetrics and Gynecology

## 2021-01-04 ENCOUNTER — Other Ambulatory Visit (HOSPITAL_COMMUNITY)
Admission: RE | Admit: 2021-01-04 | Discharge: 2021-01-04 | Disposition: A | Discharge status: Home / Self Care | Payer: Medicaid Other | Source: Ambulatory Visit | Attending: Obstetrics and Gynecology | Admitting: Obstetrics and Gynecology

## 2021-01-04 VITALS — BP 118/70 | Ht 60.0 in | Wt 212.2 lb

## 2021-01-04 DIAGNOSIS — Z3483 Encounter for supervision of other normal pregnancy, third trimester: Secondary | ICD-10-CM

## 2021-01-04 DIAGNOSIS — Z3A36 36 weeks gestation of pregnancy: Secondary | ICD-10-CM

## 2021-01-04 NOTE — Patient Instructions (Signed)

## 2021-01-04 NOTE — Progress Notes (Signed)
Routine Prenatal Care Visit  Subjective  Felicia Jones is a 25 y.o. G2P1001 at [redacted]w[redacted]d being seen today for ongoing prenatal care.  She is currently monitored for the following issues for this low-risk pregnancy and has Bilateral low back pain without sciatica; Obesity (BMI 30.0-34.9); Maternal obesity affecting pregnancy, antepartum; and Encounter for supervision of other normal pregnancy, third trimester on their problem list.  ----------------------------------------------------------------------------------- Patient reports backache.   Contractions: Irritability. Vag. Bleeding: None.  Movement: Present. Denies leaking of fluid.  ----------------------------------------------------------------------------------- The following portions of the patient's history were reviewed and updated as appropriate: allergies, current medications, past family history, past medical history, past social history, past surgical history and problem list. Problem list updated.   Objective  Blood pressure 118/70, height 5' (1.524 m), weight 212 lb 3.2 oz (96.3 kg), unknown if currently breastfeeding. Pregravid weight 174 lb (78.9 kg) Total Weight Gain 38 lb 3.2 oz (17.3 kg) Urinalysis:      Fetal Status: Fetal Heart Rate (bpm): 145 Fundal Height: 37 cm Movement: Present  Presentation: Vertex  General:  Alert, oriented and cooperative. Patient is in no acute distress.  Skin: Skin is warm and dry. No rash noted.   Cardiovascular: Normal heart rate noted  Respiratory: Normal respiratory effort, no problems with respiration noted  Abdomen: Soft, gravid, appropriate for gestational age. Pain/Pressure: Present     Pelvic:  Cervical exam performed Dilation: 1 Effacement (%): 20 Station: -3  Extremities: Normal range of motion.  Edema: None  Mental Status: Normal mood and affect. Normal behavior. Normal judgment and thought content.     Assessment   25 y.o. G2P1001 at [redacted]w[redacted]d by  01/31/2021, by Ultrasound  presenting for routine prenatal visit  Plan   pregnancy 2 Problems (from 06/16/20 to present)     Problem Noted Resolved   Obesity (BMI 30.0-34.9) 06/16/2020 by Zipporah Plants, CNM No   Maternal obesity affecting pregnancy, antepartum 06/16/2020 by Zipporah Plants, CNM No   Encounter for supervision of other normal pregnancy, third trimester 06/16/2020 by Zipporah Plants, CNM No   Overview Addendum 11/24/2020  9:45 AM by Natale Milch, MD     Nursing Staff Provider  Office Location  [facility] Dating   9w [place]  Language  English Anatomy US  complete  Flu Vaccine   declined Genetic Screen  NIPS: neg x 3, XY   TDaP vaccine   11/24/2020 Hgb A1C or  GTT Early : 105 Third trimester : 117  Rhogam   n/a   LAB RESULTS   Feeding Plan  breast Blood Type B/Positive/-- (12/15 1603)   Contraception  nuvaring vs patch Antibody Negative (12/15 1603)  Circumcision  Rubella 2.28 (12/15 1603)  Pediatrician   RPR Non Reactive (12/15 1603)   Support Person  Dequan HBsAg Negative (12/15 1603)   Prenatal Classes  HIV Non Reactive (12/15 1603)    Varicella  immune  BTL Consent  GBS  (For PCN allergy, check sensitivities)        VBAC Consent  n/a Pap  06/15/20 NILM    Hgb Electro   normal hgb    CF  negative     SMA  negative        Obesity in pregnancy: pregravid BMI: 33   Growth [place]: every 4 weeks Antenatal testing: None (pregravid BMI less than 35)             GBS, GC/ CT today  Gestational age appropriate obstetric precautions including but not limited to  vaginal bleeding, contractions, leaking of fluid and fetal movement were reviewed in detail with the patient.    Return in about 1 week (around 01/11/2021) for ROB in person.  Natale Milch MD [facility], [facility] 01/04/2021, 8:39 AM

## 2021-01-06 LAB — CERVICOVAGINAL ANCILLARY ONLY
Chlamydia: NEGATIVE
Comment: NEGATIVE
Comment: NORMAL
Neisseria Gonorrhea: NEGATIVE

## 2021-01-08 LAB — CULTURE, BETA STREP (GROUP B ONLY): Strep Gp B Culture: NEGATIVE

## 2021-01-09 ENCOUNTER — Observation Stay
Admission: EM | Admit: 2021-01-09 | Discharge: 2021-01-09 | Disposition: A | Discharge status: Home / Self Care | Payer: Medicaid Other | Attending: Obstetrics | Admitting: Obstetrics

## 2021-01-09 DIAGNOSIS — O4703 False labor before 37 completed weeks of gestation, third trimester: Principal | ICD-10-CM | POA: Insufficient documentation

## 2021-01-09 DIAGNOSIS — O99210 Obesity complicating pregnancy, unspecified trimester: Secondary | ICD-10-CM

## 2021-01-09 DIAGNOSIS — Z3A36 36 weeks gestation of pregnancy: Secondary | ICD-10-CM

## 2021-01-09 DIAGNOSIS — O479 False labor, unspecified: Secondary | ICD-10-CM | POA: Diagnosis present

## 2021-01-09 DIAGNOSIS — E669 Obesity, unspecified: Secondary | ICD-10-CM

## 2021-01-09 DIAGNOSIS — Z3483 Encounter for supervision of other normal pregnancy, third trimester: Secondary | ICD-10-CM

## 2021-01-09 NOTE — Discharge Summary (Signed)
Please see Final Progress Note  Mirna Mires, CNM  01/09/2021 5:57 PM

## 2021-01-09 NOTE — OB Triage Note (Signed)
Patient is G2P1, [redacted]w[redacted]d that presents with complaints of contractions over the past hour. Patient rates her pain a 8/10 and states that she has some back pain with the contractions too. Patient states that contractions are every 4 minutes. Patient denies any bleeding. +FM. Patient says that she is unsure about her water breaking because she has had some fluid leaking over the past couple of days, states that she "has to change her underwear". VSS. Monitors applied and assessing.

## 2021-01-09 NOTE — Final Progress Note (Signed)
Final Progress Note  Patient ID: Felicia Jones MRN: [id number] DOB/AGE: 10-May-1996 25 y.o.  Admit date: 01/09/2021 Admitting provider: Mirna Mires, CNM Discharge date: 01/09/2021   Admission Diagnoses: irregular uterine contractions  [redacted] weeks gestation of pregnancy  Discharge Diagnoses:  Active Problems:   Irregular uterine contractions   [redacted] weeks gestation of pregnancy  Reactive fetal heart tones  History of Present Illness: The patient is a 25 y.o. female G2P1001 at [redacted]w[redacted]d who presents for a complaint of irregular , mild contractions that began last evening, and have bothered her intermittently today. She denies any LOF, or vaginal bleeding. She was seen in the office several days ago and was 1 cm dilate. After her arrival to labor and Delivery this hour, she admits that her contractions have ceased.   Past Medical History:  Diagnosis Date   Arthritis    in back   Migraines     Past Surgical History:  Procedure Laterality Date   WISDOM TOOTH EXTRACTION      No current facility-administered medications on file prior to encounter.   Current Outpatient Medications on File Prior to Encounter  Medication Sig Dispense Refill   butalbital-acetaminophen-caffeine (FIORICET) 50-325-40 MG tablet Take 1 tablet by mouth every 6 (six) hours as needed for headache. (Patient not taking: Reported on 01/09/2021) 30 tablet 0   Doxylamine-Pyridoxine (DICLEGIS) 10-10 MG TBEC Take 2 tablets by mouth at bedtime. If symptoms persist, add one tablet in the morning and one in the afternoon 100 tablet 5   ondansetron (ZOFRAN ODT) 4 MG disintegrating tablet Take 1 tablet (4 mg total) by mouth every 8 (eight) hours as needed. (Patient not taking: Reported on 01/09/2021) 20 tablet 0   Prenat w/o A-[id number] (CITRANATAL ASSURE) 35-1 & 300 MG tablet Take 2 tablets by mouth daily. 60 each 3   prochlorperazine (COMPAZINE) 10 MG tablet Take 1 tablet (10 mg total) by mouth every 8 (eight) hours as  needed for nausea or vomiting (headache). 30 tablet 0    No Known Allergies  Social History   Socioeconomic History   Marital status: Single    Spouse name: Not on file   Number of children: Not on file   Years of education: 12   Highest education level: Not on file  Occupational History   Occupation: restaurant employee    Comment: [employer]  Tobacco Use   Smoking status: Never   Smokeless tobacco: Never  Vaping Use   Vaping Use: Never used  Substance and Sexual Activity   Alcohol use: No   Drug use: No   Sexual activity: Yes    Birth control/protection: None  Other Topics Concern   Not on file  Social History Narrative   Not on file   Social Determinants of Health   Financial Resource Strain: Not on file  Food Insecurity: Not on file  Transportation Needs: Not on file  Physical Activity: Not on file  Stress: Not on file  Social Connections: Not on file  Intimate Partner Violence: Not on file    Family History  Problem Relation Age of Onset   Migraines Mother    Diabetes Maternal Grandmother    Hypertension Maternal Grandmother    Diabetes Maternal Grandfather    Hypertension Maternal Grandfather      ROS   Physical Exam: BP (!) 114/59 (BP Location: Left Arm)   Pulse 87   Temp 98.2 F (36.8 C) (Oral)   Resp 18   Ht 5' (1.524 m)  Wt 96.2 kg   LMP  (LMP Unknown)   BMI 41.40 kg/m   OBGyn Exam  Consults: None  Significant Findings/ Diagnostic Studies: none  Procedures: EFM NST Baseline FHR: 140 baseline beats/min Variability: moderate Accelerations: present Decelerations: absent Tocometry: no contractions seen or palpated  Interpretation:  INDICATIONS: rule out uterine contractions RESULTS:  A NST procedure was performed with FHR monitoring and a normal baseline established, appropriate time of 20-40 minutes of evaluation, and accels >2 seen w 15x15 characteristics.  Results show a REACTIVE NST.    Hospital Course: The patient was  admitted to Labor and Delivery Triage for observation. She was placed on the fetal monitor, and an NST was performed. No contractions were picked up via the toco. A vaginal exam was performed, and the patient was found to be 1-2 cms dilated. As she now reports no longer having contractions, she is discharged home with plans for follow up at [facility] in two days.  Discharge Condition: good  Disposition: Discharge disposition: 01-Home or Self Care      Diet: Regular diet  Discharge Activity: Activity as tolerated  Discharge Instructions     Discharge activity:  No Restrictions   Complete by: As directed    Discharge diet:  No restrictions   Complete by: As directed    Fetal Kick Count:  Lie on our left side for one hour after a meal, and count the number of times your baby kicks.  If it is less than 5 times, get up, move around and drink some juice.  Repeat the test 30 minutes later.  If it is still less than 5 kicks in an hour, notify your doctor.   Complete by: As directed    LABOR:  When conractions begin, you should start to time them from the beginning of one contraction to the beginning  of the next.  When contractions are 5 - 10 minutes apart or less and have been regular for at least an hour, you should call your health care provider.   Complete by: As directed    No sexual activity restrictions   Complete by: As directed    Notify physician for bleeding from the vagina   Complete by: As directed    Notify physician for blurring of vision or spots before the eyes   Complete by: As directed    Notify physician for chills or fever   Complete by: As directed    Notify physician for fainting spells, "black outs" or loss of consciousness   Complete by: As directed    Notify physician for increase in vaginal discharge   Complete by: As directed    Notify physician for leaking of fluid   Complete by: As directed    Notify physician for pain or burning when urinating    Complete by: As directed    Notify physician for pelvic pressure (sudden increase)   Complete by: As directed    Notify physician for severe or continued nausea or vomiting   Complete by: As directed    Notify physician for sudden gushing of fluid from the vagina (with or without continued leaking)   Complete by: As directed    Notify physician for sudden, constant, or occasional abdominal pain   Complete by: As directed    Notify physician if baby moving less than usual   Complete by: As directed       Allergies as of 01/09/2021   No Known Allergies  Medication List     STOP taking these medications    butalbital-acetaminophen-caffeine 50-325-40 MG tablet Commonly known as: FIORICET       TAKE these medications    CitraNatal Assure 35-1 & 300 MG tablet Take 2 tablets by mouth daily.   Doxylamine-Pyridoxine 10-10 MG Tbec Commonly known as: Diclegis Take 2 tablets by mouth at bedtime. If symptoms persist, add one tablet in the morning and one in the afternoon   prochlorperazine 10 MG tablet Commonly known as: COMPAZINE Take 1 tablet (10 mg total) by mouth every 8 (eight) hours as needed for nausea or vomiting (headache).       ASK your doctor about these medications    ondansetron 4 MG disintegrating tablet Commonly known as: Zofran ODT Take 1 tablet (4 mg total) by mouth every 8 (eight) hours as needed.         Total time spent taking care of this patient: 25 minutes  Signed: Mirna Mires, CNM  01/09/2021, 6:05 PM

## 2021-01-09 NOTE — Progress Notes (Signed)
Discharge home. Left floor ambulatory by self. [name]  

## 2021-01-12 ENCOUNTER — Other Ambulatory Visit: Payer: Self-pay

## 2021-01-12 ENCOUNTER — Ambulatory Visit (INDEPENDENT_AMBULATORY_CARE_PROVIDER_SITE_OTHER): Payer: Medicaid Other | Admitting: Obstetrics and Gynecology

## 2021-01-12 ENCOUNTER — Encounter: Payer: Self-pay | Admitting: Obstetrics and Gynecology

## 2021-01-12 VITALS — BP 118/70 | Ht 60.0 in | Wt 215.8 lb

## 2021-01-12 DIAGNOSIS — Z3A37 37 weeks gestation of pregnancy: Secondary | ICD-10-CM

## 2021-01-12 DIAGNOSIS — O99210 Obesity complicating pregnancy, unspecified trimester: Secondary | ICD-10-CM

## 2021-01-12 DIAGNOSIS — Z3483 Encounter for supervision of other normal pregnancy, third trimester: Secondary | ICD-10-CM

## 2021-01-12 NOTE — Progress Notes (Signed)
Routine Prenatal Care Visit  Subjective  Felicia Jones is a 25 y.o. G2P1001 at [redacted]w[redacted]d being seen today for ongoing prenatal care.  She is currently monitored for the following issues for this low-risk pregnancy and has Bilateral low back pain without sciatica; Obesity (BMI 30.0-34.9); Maternal obesity affecting pregnancy, antepartum; Encounter for supervision of other normal pregnancy, third trimester; Irregular uterine contractions; and [redacted] weeks gestation of pregnancy on their problem list.  ----------------------------------------------------------------------------------- Patient reports  backache and more irregular contractions .   Contractions: Irritability. Vag. Bleeding: None.  Movement: Present. Denies leaking of fluid.  ----------------------------------------------------------------------------------- The following portions of the patient's history were reviewed and updated as appropriate: allergies, current medications, past family history, past medical history, past social history, past surgical history and problem list. Problem list updated.   Objective  Blood pressure 118/70, height 5' (1.524 m), weight 215 lb 12.8 oz (97.9 kg), unknown if currently breastfeeding. Pregravid weight 174 lb (78.9 kg) Total Weight Gain 41 lb 12.8 oz (19 kg) Urinalysis:      Fetal Status: Fetal Heart Rate (bpm): 140 Fundal Height: 40 cm Movement: Present     General:  Alert, oriented and cooperative. Patient is in no acute distress.  Skin: Skin is warm and dry. No rash noted.   Cardiovascular: Normal heart rate noted  Respiratory: Normal respiratory effort, no problems with respiration noted  Abdomen: Soft, gravid, appropriate for gestational age. Pain/Pressure: Present     Pelvic:  Cervical exam performed Dilation: 2 Effacement (%): 50 Station: -3  Extremities: Normal range of motion.  Edema: None  Mental Status: Normal mood and affect. Normal behavior. Normal judgment and thought content.      Assessment   25 y.o. G2P1001 at [redacted]w[redacted]d by  01/31/2021, by Ultrasound presenting for routine prenatal visit  Plan   pregnancy 2 Problems (from 06/16/20 to present)     Problem Noted Resolved   Obesity (BMI 30.0-34.9) 06/16/2020 by Zipporah Plants, CNM No   Maternal obesity affecting pregnancy, antepartum 06/16/2020 by Zipporah Plants, CNM No   Encounter for supervision of other normal pregnancy, third trimester 06/16/2020 by Zipporah Plants, CNM No   Overview Addendum 01/09/2021  5:37 PM by Mirna Mires, CNM     Nursing Staff Provider  Office Location  [facility] Dating   9w [place]  Language  English Anatomy US  complete  Flu Vaccine   declined Genetic Screen  NIPS: neg x 3, XY   TDaP vaccine   11/24/2020 Hgb A1C or  GTT Early : 105 Third trimester : 117  Rhogam   n/a   LAB RESULTS   Feeding Plan  breast Blood Type B/Positive/-- (12/15 1603)   Contraception  nuvaring vs patch Antibody Negative (12/15 1603)  Circumcision  Rubella 2.28 (12/15 1603)  Pediatrician   RPR Non Reactive (12/15 1603)   Support Person  Dequan HBsAg Negative (12/15 1603)   Prenatal Classes  HIV Non Reactive (12/15 1603)    Varicella  immune  BTL Consent  GBS  (For PCN allergy, check sensitivities)        VBAC Consent  n/a Pap  06/15/20 NILM    Hgb Electro   normal hgb    CF  negative     SMA  negative        Obesity in pregnancy: pregravid BMI: 33   Growth [place]: every 4 weeks Antenatal testing: None (pregravid BMI less than 35)  Gestational age appropriate obstetric precautions including but not limited to vaginal bleeding, contractions, leaking of fluid and fetal movement were reviewed in detail with the patient.    Return in about 1 week (around 01/19/2021) for ROB in person.  Natale Milch MD [facility], [facility] 01/12/2021, 8:44 AM

## 2021-01-12 NOTE — Patient Instructions (Signed)
Vaginal Delivery  Vaginal delivery means that you give birth by pushing your baby out of your birth canal (vagina). Your health care team will help you before, during, and after vaginaldelivery. Birth experiences are unique for every woman and every pregnancy, and birthexperiences vary depending on where you choose to give birth. What are the risks and benefits? Generally, this is safe. However, problems may occur, including: Bleeding. Infection. Damage to other structures such as vaginal tearing. Allergic reactions to medicines. Despite the risks, benefits of vaginal delivery include less risk of bleeding and infection and a shorter recovery time compared to a Cesarean delivery.Cesarean delivery, or C-section, is the surgical delivery of a baby. What happens when I arrive at the birth center or hospital? Once you are in labor and have been admitted into the hospital or birth center, your health care team may: Review your pregnancy history and any concerns that you have. Talk with you about your birth plan and discuss pain control options. Check your blood pressure, breathing, and heartbeat. Assess your baby's heartbeat. Monitor your uterus for contractions. Check whether your bag of water (amniotic sac) has broken (ruptured). Insert an IV into one of your veins. This may be used to give you fluids and medicines. Monitoring Your health care team may assess your contractions (uterine monitoring) and your baby's heart rate (fetal monitoring). You may need to be monitored: Often, but not continuously (intermittently). All the time or for long periods at a time (continuously). Continuous monitoring may be needed if: You are taking certain medicines, such as medicine to relieve pain or make your contractions stronger. You have pregnancy or labor complications. Monitoring may be done by: Placing a special stethoscope or a handheld monitoring device on your abdomen to check your baby's heartbeat  and to check for contractions. Placing monitors on your abdomen (external monitors) to record your baby's heartbeat and the frequency and length of contractions. Placing monitors inside your uterus through your vagina (internal monitors) to record your baby's heartbeat and the frequency, length, and strength of your contractions. Depending on the type of monitor, it may remain in your uterus or on your baby's head until birth. Telemetry. This is a type of continuous monitoring that can be done with external or internal monitors. Instead of having to stay in bed, you are able to move around. Physical exam Your health care team may perform frequent physical exams. This may include: Checking how and where your baby is positioned in your uterus. Checking your cervix to determine: Whether it is thinning out (effacing). Whether it is opening up (dilating). What happens during labor and delivery?  Normal labor and delivery is divided into the following three stages: Stage 1 This is the longest stage of labor. Throughout this stage, you will feel contractions. Contractions generally feel mild, infrequent, and irregular at first. They get stronger, more frequent, and more regular as you move through this stage. You may have contractions about every 2-3 minutes. This stage ends when your cervix is completely dilated to 4 inches (10 cm) and completely effaced. Stage 2 This stage starts once your cervix is completely effaced and dilated and lasts until the delivery of your baby. This is the stage where you will feel an urge to push your baby out of your vagina. You may feel stretching and burning pain, especially when the widest part of your baby's head passes through the vaginal opening (crowning). Once your baby is delivered, the umbilical cord will be clamped and cut.  Timing of cutting the cord will depend on your wishes, your baby's health, and your health care provider's practices. Your baby will be  placed on your bare chest (skin-to-skin contact) in an upright position and covered with a warm blanket. If you are choosing to breastfeed, watch your baby for feeding cues, like rooting or sucking, and help the baby to your breast for his or her first feeding. Stage 3 This stage starts immediately after the birth of your baby and ends after you deliver the placenta. This stage may take anywhere from 5 to 30 minutes. After your baby has been delivered, you will feel contractions as your body expels the placenta. These contractions also help your uterus get smaller and reduce bleeding. What can I expect after labor and delivery? After labor is over, you and your baby will be assessed closely until you are ready to go home. Your health care team will teach you how to care for yourself and your baby. You and your baby may be encouraged to stay in the same room (rooming in) during your hospital stay. This will help promote early bonding and successful breastfeeding. Your uterus will be checked and massaged regularly (fundal massage). You may continue to receive fluids and medicines through an IV. You will have some soreness and pain in your abdomen, vagina, and the area of skin between your vaginal opening and your anus (perineum). If an incision was made near your vagina (episiotomy) or if you had some vaginal tearing during delivery, cold compresses may be placed on your episiotomy or your tear. This helps to reduce pain and swelling. It is normal to have vaginal bleeding after delivery. Wear a sanitary pad for vaginal bleeding and discharge. Summary Vaginal delivery means that you will give birth by pushing your baby out of your birth canal (vagina). Your health care team will monitor you and your baby throughout the stages of labor. After you deliver your baby, your health care team will continue to assess you and your baby to ensure you are both recovering as expected after delivery. This  information is not intended to replace advice given to you by your health care provider. Make sure you discuss any questions you have with your healthcare provider. Document Revised: 05/17/2020 Document Reviewed: 05/17/2020 Elsevier Patient Education  2022 [employer]. [email or web address]  Third Trimester of Pregnancy  The third trimester of pregnancy is from week 28 through week 40. This is months 7 through 9. The third trimester is a time when the unborn baby (fetus) is growing rapidly. At the end of the ninth month, the fetus is about 20inches long and weighs 6-10 pounds. Body changes during your third trimester During the third trimester, your body will continue to go through many changes.The changes vary and generally return to normal after your baby is born. Physical changes Your weight will continue to increase. You can expect to gain 25-35 pounds (11-16 kg) by the end of the pregnancy if you begin pregnancy at a normal weight. If you are underweight, you can expect to gain 28-40 lb (about 13-18 kg), and if you are overweight, you can expect to gain 15-25 lb (about 7-11 kg). You may begin to get stretch marks on your hips, abdomen, and breasts. Your breasts will continue to grow and may hurt. A yellow fluid (colostrum) may leak from your breasts. This is the first milk you are producing for your baby. You may have changes in your hair. These can include thickening of your  hair, rapid growth, and changes in texture. Some people also have hair loss during or after pregnancy, or hair that feels dry or thin. Your belly button may stick out. You may notice more swelling in your hands, face, or ankles. Health changes You may have heartburn. You may have constipation. You may develop hemorrhoids. You may develop swollen, bulging veins (varicose veins) in your legs. You may have increased body aches in the pelvis, back, or thighs. This is  due to weight gain and increased hormones that are relaxing your joints. You may have increased tingling or numbness in your hands, arms, and legs. The skin on your abdomen may also feel numb. You may feel short of breath because of your expanding uterus. Other changes You may urinate more often because the fetus is moving lower into your pelvis and pressing on your bladder. You may have more problems sleeping. This may be caused by the size of your abdomen, an increased need to urinate, and an increase in your body's metabolism. You may notice the fetus "dropping," or moving lower in your abdomen (lightening). You may have increased vaginal discharge. You may notice that you have pain around your pelvic bone as your uterus distends. Follow these instructions at home: Medicines Follow your health care provider's instructions regarding medicine use. Specific medicines may be either safe or unsafe to take during pregnancy. Do not take any medicines unless approved by your health care provider. Take a prenatal vitamin that contains at least 600 micrograms (mcg) of folic acid. Eating and drinking Eat a healthy diet that includes fresh fruits and vegetables, whole grains, good sources of protein such as meat, eggs, or tofu, and low-fat dairy products. Avoid raw meat and unpasteurized juice, milk, and cheese. These carry germs that can harm you and your baby. Eat 4 or 5 small meals rather than 3 large meals a day. You may need to take these actions to prevent or treat constipation: Drink enough fluid to keep your urine pale yellow. Eat foods that are high in fiber, such as beans, whole grains, and fresh fruits and vegetables. Limit foods that are high in fat and processed sugars, such as fried or sweet foods. Activity Exercise only as directed by your health care provider. Most people can continue their usual exercise routine during pregnancy. Try to exercise for 30 minutes at least 5 days a week.  Stop exercising if you experience contractions in the uterus. Stop exercising if you develop pain or cramping in the lower abdomen or lower back. Avoid heavy lifting. Do not exercise if it is very hot or humid or if you are at a high altitude. If you choose to, you may continue to have sex unless your health care provider tells you not to. Relieving pain and discomfort Take frequent breaks and rest with your legs raised (elevated) if you have leg cramps or low back pain. Take warm sitz baths to soothe any pain or discomfort caused by hemorrhoids. Use hemorrhoid cream if your health care provider approves. Wear a supportive bra to prevent discomfort from breast tenderness. If you develop varicose veins: Wear support hose as told by your health care provider. Elevate your feet for 15 minutes, 3-4 times a day. Limit salt in your diet. Safety Talk to your health care provider before traveling far distances. Do not use hot tubs, steam rooms, or saunas. Wear your seat belt at all times when driving or riding in a car. Talk with your health care provider if  someone is verbally or physically abusive to you. Preparing for birth To prepare for the arrival of your baby: Take prenatal classes to understand, practice, and ask questions about labor and delivery. Visit the hospital and tour the maternity area. Purchase a rear-facing car seat and make sure you know how to install it in your car. Prepare the baby's room or sleeping area. Make sure to remove all pillows and stuffed animals from the baby's crib to prevent suffocation. General instructions Avoid cat litter boxes and soil used by cats. These carry germs that can cause birth defects in the baby. If you have a cat, ask someone to clean the litter box for you. Do not douche or use tampons. Do not use scented sanitary pads. Do not use any products that contain nicotine or tobacco, such as cigarettes, e-cigarettes, and chewing tobacco. If you need  help quitting, ask your health care provider. Do not use any herbal remedies, illegal drugs, or medicines that were not prescribed to you. Chemicals in these products can harm your baby. Do not drink alcohol. You will have more frequent prenatal exams during the third trimester. During a routine prenatal visit, your health care provider will do a physical exam, perform tests, and discuss your overall health. Keep all follow-up visits. This is important. Where to find more information American Pregnancy Association: americanpregnancy.org [employer]: https://www.todd-brady.net/ Office on [employer] Health: MightyReward.co.nz Contact a health care provider if you have: A fever. Mild pelvic cramps, pelvic pressure, or nagging pain in your abdominal area or lower back. Vomiting or diarrhea. Bad-smelling vaginal discharge or foul-smelling urine. Pain when you urinate. A headache that does not go away when you take medicine. Visual changes or see spots in front of your eyes. Get help right away if: Your water breaks. You have regular contractions less than 5 minutes apart. You have spotting or bleeding from your vagina. You have severe abdominal pain. You have difficulty breathing. You have chest pain. You have fainting spells. You have not felt your baby move for the time period told by your health care provider. You have new or increased pain, swelling, or redness in an arm or leg. Summary The third trimester of pregnancy is from week 28 through week 40 (months 7 through 9). You may have more problems sleeping. This can be caused by the size of your abdomen, an increased need to urinate, and an increase in your body's metabolism. You will have more frequent prenatal exams during the third trimester. Keep all follow-up visits. This is important. This information is not intended to replace advice given to you by your health care provider.  Make sure you discuss any questions you have with your healthcare provider. Document Revised: 11/26/2019 Document Reviewed: 10/02/2019 Elsevier Patient Education  2022 [employer]. Labor Induction Labor induction is when steps are taken to cause a pregnant woman to begin the labor process. Most women go into labor on their own between 37 weeks and 42 weeks of pregnancy. When this does not happen, or when there is a medical needfor labor to begin, steps may be taken to induce, or bring on, labor. Labor induction causes a pregnant woman's uterus to contract. It also causes the cervix to soften (ripen), open (dilate), and thin out. Usually, labor is not induced before 39 weeks of pregnancyunless there is a medical reason to do so. When is labor induction considered? Labor induction may be right for you if: Your pregnancy lasts longer than 41  to 42 weeks. Your placenta is separating from your uterus (placental abruption). You have a rupture of membranes and your labor does not begin. You have health problems, like diabetes or high blood pressure (preeclampsia) during your pregnancy. Your baby has stopped growing or does not have enough amniotic fluid. Before labor induction begins, your health care provider will consider the following factors: Your medical condition and the baby's condition. How many weeks you have been pregnant. How mature the baby's lungs are. The condition of your cervix. The position of the baby. The size of your birth canal. Tell a health care provider about: Any allergies you have. All medicines you are taking, including vitamins, herbs, eye drops, creams, and over-the-counter medicines. Any problems you or your family members have had with anesthetic medicines. Any surgeries you have had. Any blood disorders you have. Any medical conditions you have. What are the risks? Generally, this is a safe procedure. However, problems may occur, including: Failed  induction. Changes in fetal heart rate, such as being too high, too low, or irregular (erratic). Infection in the mother or the baby. Increased risk of having a cesarean delivery. Breaking off (abruption) of the placenta from the uterus. This is rare. Rupture of the uterus. This is very rare. Your baby could fail to get enough blood flow or oxygen. This can be life-threatening. When induction is needed for medical reasons, the benefits generally outweighthe risks. What happens during the procedure? During the procedure, your health care provider will use one of these methods to induce labor: Stripping the membranes. In this method, the amniotic sac tissue is gently separated from the cervix. This causes the following to happen: Your cervix stretches, which in turn causes the release of prostaglandins. Prostaglandins induce labor and cause the uterus to contract. This procedure is often done in an office visit. You will be sent home to wait for contractions to begin. Prostaglandin medicine. This medicine starts contractions and causes the cervix to dilate and ripen. This can be taken by mouth (orally) or by being inserted into the vagina (suppository). Inserting a small, thin tube (catheter) with a balloon into the vagina and then expanding the balloon with water to dilate the cervix. Breaking the water. In this method, a small instrument is used to make a small hole in the amniotic sac. This eventually causes the amniotic sac to break. Contractions should begin within a few hours. Medicine to trigger or strengthen contractions. This medicine is given through an IV that is inserted into a vein in your arm. This procedure may vary among health care providers and hospitals. Where to find more information [employer]: www.marchofdimes.org [employer]: www.acog.org Summary Labor induction causes a pregnant woman's uterus to contract. It also causes the  cervix to soften (ripen), open (dilate), and thin out. Labor is usually not induced before 39 weeks of pregnancy unless there is a medical reason to do so. When induction is needed for medical reasons, the benefits generally outweigh the risks. Talk with your health care provider about which methods of labor induction are right for you. This information is not intended to replace advice given to you by your health care provider. Make sure you discuss any questions you have with your healthcare provider. Document Revised: 04/01/2020 Document Reviewed: 04/01/2020 Elsevier Patient Education  2022 [employer] Inc[employer] Pain Relief During Labor and Delivery Many things can cause pain during labor and delivery, including: Pressure due to the baby moving through the  pelvis. Stretching of tissues due to the baby moving through the birth canal. Muscle tension due to anxiety or nervousness. The uterus tightening (contracting)and relaxing to help move the baby. How do I get pain relief during labor and delivery?  Discuss your pain relief options with your health care provider during your prenatal visits. Explore the options offered by your hospital or birth center. There are many ways to deal with the pain of labor and delivery. You can try relaxation techniques or doing relaxing activities, taking a warm shower or bath (hydrotherapy), or other methods. There are also many medicines available to help controlpain. Relaxation techniques and activities Practice relaxation techniques or do relaxing activities, such as: Focused breathing. Meditation. Visualization. Aroma therapy. Listening to your favorite music. Hypnosis. Hydrotherapy Take a warm shower or bath. This may: Provide comfort and relaxation. Lessen your feeling of pain. Reduce the amount of pain medicine needed. Shorten the length of labor. Other methods Try doing other things, such as: Getting a massage or having counterpressure on your  back. Applying warm packs or ice packs. Changing positions often, moving around, or using a birthing ball. Medicines You may be given: Pain medicine through an IV or an injection into a muscle. Pain medicine inserted into your spinal column. Injections of sterile water just under the skin on your lower back. Nitrous oxide inhalation therapy, also called laughing gas. What kinds of medicine are available for pain relief? There are two kinds of medicines that can be used to relieve pain during labor and delivery: Analgesics. These medicines decrease pain without causing you to lose feeling or the ability to move your muscles. Anesthetics. These medicines block feeling in the body and can decrease your ability to move freely. Both kinds of medicine can cause minor side effects, such as nausea, trouble concentrating, and sleepiness. They can also affect the baby's heart rate before birth and his or her breathing after birth. For this reason, health careproviders are careful about when and how much medicine is given. Which medicines are used to provide pain relief? Common medicines The most common medicines used to help manage pain during labor and delivery include: Opioids. Opioids are medicines that decrease how much pain you feel (perception of pain). These medicines can be given through an IV or may be used with anesthetics to block pain. Epidural analgesia. Epidural analgesia is given through a very thin tube that is inserted into the lower back. Medicine is delivered continuously to the area near your spinal column nerves (epidural space). After having this treatment, you may be able to move your legs, but you will not be able to walk. Depending on the amount and type of medicine given, you may lose all feeling in the lower half of your body, or you may have some sensation, including the urge to push. This treatment can be used to give pain relief for a vaginal birth. Sometimes, a numbing  medicine is injected into the spinal fluid when an epidural catheter is placed. This provides for immediate relief but only lasts for 1-2 hours. Once it wears off, the epidural will provide pain relief. This is called a combined spinal-epidural (CSE) block. Intrathecal analgesia (spinal analgesia). Intrathecal analgesia is similar to epidural analgesia, but the medicine is injected into the spinal fluid instead of the epidural space. It is usually only given once. It starts to relieve pain quickly, but the pain relief lasts only 1-2 hours. Pudendal block. This block is done by injecting numbing medicine through  the wall of the vagina and into a nerve in the pelvis. Other medicines Other medicines used to help manage pain during labor and delivery include: Local anesthetics. These are used to numb a small area of the body. They may be used along with another kind of medicine or used to numb the nerves of the vagina, cervix, and perineum during the second stage of labor. Spinal block (spinal anesthesia). Spinal anesthesia is similar to spinal analgesia, but the medicine that is used contains longer-acting numbing medicines and pain medicines. This type of anesthesia can be used for a cesarean delivery and allows you to stay awake for the birth of your baby. General anesthetics cause you to lose consciousness so you do not feel pain. They are usually only used for an emergency cesarean delivery. These medicines are given through an IV or a mask or both. These medicines are used as part of a procedure or for an emergency delivery. Summary Women have many options to help them manage the pain associated with labor and delivery. You can try doing relaxing activities, taking a warm shower or bath, or other methods. There are also many medicines available to help control pain during labor and delivery. Talk with your health care provider about what options are available to you. This information is not intended  to replace advice given to you by your health care provider. Make sure you discuss any questions you have with your healthcare provider. Document Revised: 05/07/2019 Document Reviewed: 05/07/2019 Elsevier Patient Education  2022 [employer].

## 2021-01-16 ENCOUNTER — Other Ambulatory Visit: Payer: Self-pay

## 2021-01-16 ENCOUNTER — Encounter: Payer: Self-pay | Admitting: Obstetrics and Gynecology

## 2021-01-16 ENCOUNTER — Inpatient Hospital Stay
Admission: EM | Admit: 2021-01-16 | Discharge: 2021-01-18 | DRG: 806 | Disposition: A | Discharge status: Home / Self Care | Payer: Medicaid Other | Attending: Obstetrics and Gynecology | Admitting: Obstetrics and Gynecology

## 2021-01-16 DIAGNOSIS — Z3483 Encounter for supervision of other normal pregnancy, third trimester: Secondary | ICD-10-CM

## 2021-01-16 DIAGNOSIS — Z20822 Contact with and (suspected) exposure to covid-19: Secondary | ICD-10-CM | POA: Diagnosis present

## 2021-01-16 DIAGNOSIS — R030 Elevated blood-pressure reading, without diagnosis of hypertension: Secondary | ICD-10-CM | POA: Diagnosis present

## 2021-01-16 DIAGNOSIS — E669 Obesity, unspecified: Secondary | ICD-10-CM

## 2021-01-16 DIAGNOSIS — O26893 Other specified pregnancy related conditions, third trimester: Secondary | ICD-10-CM | POA: Diagnosis present

## 2021-01-16 DIAGNOSIS — O99210 Obesity complicating pregnancy, unspecified trimester: Secondary | ICD-10-CM

## 2021-01-16 DIAGNOSIS — Z3A37 37 weeks gestation of pregnancy: Secondary | ICD-10-CM | POA: Diagnosis not present

## 2021-01-16 DIAGNOSIS — O9081 Anemia of the puerperium: Secondary | ICD-10-CM | POA: Diagnosis not present

## 2021-01-16 DIAGNOSIS — O99214 Obesity complicating childbirth: Secondary | ICD-10-CM | POA: Diagnosis present

## 2021-01-16 DIAGNOSIS — D62 Acute posthemorrhagic anemia: Secondary | ICD-10-CM | POA: Diagnosis not present

## 2021-01-16 LAB — RESP PANEL BY RT-PCR (FLU A&B, COVID) ARPGX2
Influenza A by PCR: NEGATIVE
Influenza B by PCR: NEGATIVE
SARS Coronavirus 2 by RT PCR: NEGATIVE

## 2021-01-16 LAB — COMPREHENSIVE METABOLIC PANEL
ALT: 23 U/L (ref 0–44)
AST: 35 U/L (ref 15–41)
Albumin: 3 g/dL — ABNORMAL LOW (ref 3.5–5.0)
Alkaline Phosphatase: 165 U/L — ABNORMAL HIGH (ref 38–126)
Anion gap: 6 (ref 5–15)
BUN: 12 mg/dL (ref 6–20)
CO2: 22 mmol/L (ref 22–32)
Calcium: 8.3 mg/dL — ABNORMAL LOW (ref 8.9–10.3)
Chloride: 107 mmol/L (ref 98–111)
Creatinine, Ser: 0.58 mg/dL (ref 0.44–1.00)
GFR, Estimated: >60 mL/min (ref >60)
Glucose, Bld: 126 mg/dL — ABNORMAL HIGH (ref 70–99)
Potassium: 3.5 mmol/L (ref 3.5–5.1)
Sodium: 135 mmol/L (ref 135–145)
Total Bilirubin: 0.6 mg/dL (ref 0.3–1.2)
Total Protein: 6.5 g/dL (ref 6.5–8.1)

## 2021-01-16 LAB — CBC
HCT: 34.7 % — ABNORMAL LOW (ref 36.0–46.0)
Hemoglobin: 11 g/dL — ABNORMAL LOW (ref 12.0–15.0)
MCH: 25.3 pg — ABNORMAL LOW (ref 26.0–34.0)
MCHC: 31.7 g/dL (ref 30.0–36.0)
MCV: 79.8 fL — ABNORMAL LOW (ref 80.0–100.0)
Platelets: 308 10*3/uL (ref 150–400)
RBC: 4.35 MIL/uL (ref 3.87–5.11)
RDW: 15.3 % (ref 11.5–15.5)
WBC: 12.1 10*3/uL — ABNORMAL HIGH (ref 4.0–10.5)
nRBC: 0 % (ref 0.0–0.2)

## 2021-01-16 LAB — TYPE AND SCREEN
ABO/RH(D): B POS
Antibody Screen: NEGATIVE

## 2021-01-16 MED ORDER — OXYCODONE-ACETAMINOPHEN 5-325 MG PO TABS: 2.0000 | ORAL_TABLET | ORAL | Status: DC | PRN

## 2021-01-16 MED ORDER — ONDANSETRON HCL 4 MG/2ML IJ SOLN: 4.0000 mg | Freq: Four times a day (QID) | INTRAMUSCULAR | Status: DC | PRN

## 2021-01-16 MED ORDER — ONDANSETRON HCL 4 MG/2ML IJ SOLN: 4.0000 mg | INTRAMUSCULAR | Status: DC | PRN

## 2021-01-16 MED ORDER — ACETAMINOPHEN 325 MG PO TABS: 650.0000 mg | ORAL_TABLET | ORAL | Status: DC | PRN

## 2021-01-16 MED ORDER — AMMONIA AROMATIC IN INHA
RESPIRATORY_TRACT | Status: AC
  Filled 2021-01-16: qty 10

## 2021-01-16 MED ORDER — ACETAMINOPHEN 325 MG PO TABS
650.0000 mg | ORAL_TABLET | ORAL | Status: DC | PRN
  Administered 2021-01-16 – 2021-01-18 (×7): 650 mg via ORAL
  Filled 2021-01-16 (×7): qty 2

## 2021-01-16 MED ORDER — LIDOCAINE HCL (PF) 1 % IJ SOLN: 30.0000 mL | INTRAMUSCULAR | Status: DC | PRN

## 2021-01-16 MED ORDER — LACTATED RINGERS IV SOLN: 500.0000 mL | INTRAVENOUS | Status: DC | PRN

## 2021-01-16 MED ORDER — IBUPROFEN 600 MG PO TABS
600.0000 mg | ORAL_TABLET | Freq: Four times a day (QID) | ORAL | Status: DC
  Administered 2021-01-16 – 2021-01-18 (×8): 600 mg via ORAL
  Filled 2021-01-16 (×8): qty 1

## 2021-01-16 MED ORDER — MISOPROSTOL 200 MCG PO TABS
ORAL_TABLET | ORAL | Status: AC
  Filled 2021-01-16: qty 4

## 2021-01-16 MED ORDER — DIBUCAINE (PERIANAL) 1 % EX OINT
1.0000 "application " | TOPICAL_OINTMENT | CUTANEOUS | Status: DC | PRN
  Administered 2021-01-17 – 2021-01-18 (×2): 1 via RECTAL
  Filled 2021-01-16 (×2): qty 28

## 2021-01-16 MED ORDER — OXYTOCIN-SODIUM CHLORIDE 30-0.9 UT/500ML-% IV SOLN
2.5000 [IU]/h | INTRAVENOUS | Status: DC
  Filled 2021-01-16: qty 500

## 2021-01-16 MED ORDER — ONDANSETRON HCL 4 MG PO TABS
4.0000 mg | ORAL_TABLET | ORAL | Status: DC | PRN
  Filled 2021-01-16: qty 1

## 2021-01-16 MED ORDER — SOD CITRATE-CITRIC ACID 500-334 MG/5ML PO SOLN: 30.0000 mL | ORAL | Status: DC | PRN

## 2021-01-16 MED ORDER — LIDOCAINE HCL (PF) 1 % IJ SOLN
INTRAMUSCULAR | Status: AC
  Filled 2021-01-16: qty 30

## 2021-01-16 MED ORDER — PRENATAL MULTIVITAMIN CH
1.0000 | ORAL_TABLET | Freq: Every day | ORAL | Status: DC
  Administered 2021-01-17 – 2021-01-18 (×2): 1 via ORAL
  Filled 2021-01-16 (×3): qty 1

## 2021-01-16 MED ORDER — BENZOCAINE-MENTHOL 20-0.5 % EX AERO
1.0000 "application " | INHALATION_SPRAY | CUTANEOUS | Status: DC | PRN
  Administered 2021-01-17 – 2021-01-18 (×2): 1 via TOPICAL
  Filled 2021-01-16 (×2): qty 56

## 2021-01-16 MED ORDER — LACTATED RINGERS IV SOLN: INTRAVENOUS | Status: DC

## 2021-01-16 MED ORDER — DIPHENHYDRAMINE HCL 25 MG PO CAPS: 25.0000 mg | ORAL_CAPSULE | Freq: Four times a day (QID) | ORAL | Status: DC | PRN

## 2021-01-16 MED ORDER — SENNOSIDES-DOCUSATE SODIUM 8.6-50 MG PO TABS
2.0000 | ORAL_TABLET | ORAL | Status: DC
  Administered 2021-01-16 – 2021-01-17 (×2): 2 via ORAL
  Filled 2021-01-16 (×2): qty 2

## 2021-01-16 MED ORDER — WITCH HAZEL-GLYCERIN EX PADS
1.0000 "application " | MEDICATED_PAD | CUTANEOUS | Status: DC | PRN
  Administered 2021-01-17 – 2021-01-18 (×2): 1 via TOPICAL
  Filled 2021-01-16 (×2): qty 100

## 2021-01-16 MED ORDER — OXYCODONE-ACETAMINOPHEN 5-325 MG PO TABS: 1.0000 | ORAL_TABLET | ORAL | Status: DC | PRN

## 2021-01-16 MED ORDER — COCONUT OIL OIL
1.0000 "application " | TOPICAL_OIL | Status: DC | PRN
  Administered 2021-01-16: 1 via TOPICAL
  Filled 2021-01-16 (×2): qty 120

## 2021-01-16 MED ORDER — BUTORPHANOL TARTRATE 1 MG/ML IJ SOLN: 1.0000 mg | INTRAMUSCULAR | Status: DC | PRN

## 2021-01-16 MED ORDER — OXYCODONE-ACETAMINOPHEN 5-325 MG PO TABS
ORAL_TABLET | ORAL | Status: AC
  Administered 2021-01-16: 2 via ORAL
  Filled 2021-01-16: qty 2

## 2021-01-16 MED ORDER — OXYTOCIN BOLUS FROM INFUSION
333.0000 mL | Freq: Once | INTRAVENOUS | Status: AC
  Administered 2021-01-16: 333 mL via INTRAVENOUS

## 2021-01-16 MED ORDER — OXYTOCIN 10 UNIT/ML IJ SOLN
INTRAMUSCULAR | Status: AC
  Filled 2021-01-16: qty 2

## 2021-01-16 MED ORDER — SIMETHICONE 80 MG PO CHEW: 80.0000 mg | CHEWABLE_TABLET | ORAL | Status: DC | PRN

## 2021-01-16 NOTE — H&P (Signed)
Obstetric H&P   Chief Complaint: Contractions  Prenatal Care Provider: [facility]  History of Present Illness: 25 y.o. G2P1001 [redacted]w[redacted]d by 01/31/2021, by Ultrasound presenting to L&D with contractions that started earlier this morning.  Noted to be 6cm on admission.  +FM, no LOF no VB.   Pregnancy uncomplicated to date other than obesity Body mass index is 42.15 kg/m.  Most recent growth scan 12/23/2020 at [redacted]w[redacted]d 2,221g 21%ile with AFI of 11.0cm.    Pregravid weight 78.9 kg Total Weight Gain 19 kg  pregnancy 2 Problems (from 06/16/20 to present)     Problem Noted Resolved   Obesity (BMI 30.0-34.9) 06/16/2020 by Zipporah Plants, CNM No   Maternal obesity affecting pregnancy, antepartum 06/16/2020 by Zipporah Plants, CNM No   Encounter for supervision of other normal pregnancy, third trimester 06/16/2020 by Zipporah Plants, CNM No   Overview Addendum 01/09/2021  5:37 PM by Mirna Mires, CNM     Nursing Staff Provider  Office Location  [facility] Dating   9w [place]  Language  English Anatomy US  complete  Flu Vaccine   declined Genetic Screen  NIPS: neg x 3, XY   TDaP vaccine   11/24/2020 Hgb A1C or  GTT Early : 105 Third trimester : 117  Rhogam   n/a   LAB RESULTS   Feeding Plan  breast Blood Type B/Positive/-- (12/15 1603)   Contraception  nuvaring vs patch Antibody Negative (12/15 1603)  Circumcision  Rubella 2.28 (12/15 1603)  Pediatrician   RPR Non Reactive (12/15 1603)   Support Person  Dequan HBsAg Negative (12/15 1603)   Prenatal Classes  HIV Non Reactive (12/15 1603)    Varicella  immune  BTL Consent  GBS  Negative       VBAC Consent  n/a Pap  06/15/20 NILM    Hgb Electro   normal hgb    CF  negative     SMA  negative        Obesity in pregnancy: pregravid BMI: 33   Growth [place]: every 4 weeks Antenatal testing: None (pregravid BMI less than 35)             Review of Systems: 10 point review of systems negative unless otherwise noted in HPI  Past Medical History: Patient  Active Problem List   Diagnosis Date Noted   Normal labor 01/16/2021   Irregular uterine contractions 01/09/2021   [redacted] weeks gestation of pregnancy    Obesity (BMI 30.0-34.9) 06/16/2020   Maternal obesity affecting pregnancy, antepartum 06/16/2020   Encounter for supervision of other normal pregnancy, third trimester 06/16/2020     Nursing Staff Provider  Office Location  [facility] Dating   9w [place]  Language  English Anatomy US  complete  Flu Vaccine   declined Genetic Screen  NIPS: neg x 3, XY   TDaP vaccine   11/24/2020 Hgb A1C or  GTT Early : 105 Third trimester : 117  Rhogam   n/a   LAB RESULTS   Feeding Plan  breast Blood Type B/Positive/-- (12/15 1603)   Contraception  nuvaring vs patch Antibody Negative (12/15 1603)  Circumcision  Rubella 2.28 (12/15 1603)  Pediatrician   RPR Non Reactive (12/15 1603)   Support Person  Dequan HBsAg Negative (12/15 1603)   Prenatal Classes  HIV Non Reactive (12/15 1603)    Varicella  immune  BTL Consent  GBS  (For PCN allergy, check sensitivities)        VBAC Consent  n/a Pap  06/15/20 NILM    Hgb Electro   normal hgb    CF  negative     SMA  negative        Obesity in pregnancy: pregravid BMI: 33   Growth [place]: every 4 weeks Antenatal testing: None (pregravid BMI less than 35)      Bilateral low back pain without sciatica 01/28/2016    Past Surgical History: Past Surgical History:  Procedure Laterality Date   WISDOM TOOTH EXTRACTION      Past Obstetric History: # 1 - Date: 05/16/18, Sex: Female, Weight: 3160 g, GA: [redacted]w[redacted]d, Delivery: Vaginal, Spontaneous, Apgar1: 9, Apgar5: 9, Living: Living, Birth Comments: None  # 2 - Date: None, Sex: None, Weight: None, GA: None, Delivery: None, Apgar1: None, Apgar5: None, Living: None, Birth Comments: None   Past Gynecologic History:  Family History: Family History  Problem Relation Age of Onset   Migraines Mother    Diabetes Maternal Grandmother    Hypertension Maternal Grandmother     Diabetes Maternal Grandfather    Hypertension Maternal Grandfather     Social History: Social History   Socioeconomic History   Marital status: Single    Spouse name: Not on file   Number of children: Not on file   Years of education: 12   Highest education level: Not on file  Occupational History   Occupation: Agricultural engineer    Comment: [employer]  Tobacco Use   Smoking status: Never   Smokeless tobacco: Never  Vaping Use   Vaping Use: Never used  Substance and Sexual Activity   Alcohol use: No   Drug use: No   Sexual activity: Yes    Birth control/protection: None  Other Topics Concern   Not on file  Social History Narrative   Not on file   Social Determinants of Health   Financial Resource Strain: Not on file  Food Insecurity: Not on file  Transportation Needs: Not on file  Physical Activity: Not on file  Stress: Not on file  Social Connections: Not on file  Intimate Partner Violence: Not on file    Medications: Prior to Admission medications   Medication Sig Start Date End Date Taking? Authorizing Provider  Prenat w/o [id number] (CITRANATAL ASSURE) 35-1 & 300 MG tablet Take 2 tablets by mouth daily. 07/21/20  Yes Veal, Katelyn, CNM  Doxylamine-Pyridoxine (DICLEGIS) 10-10 MG TBEC Take 2 tablets by mouth at bedtime. If symptoms persist, add one tablet in the morning and one in the afternoon Patient not taking: Reported on 01/16/2021 06/16/20   Zipporah Plants, CNM  ondansetron (ZOFRAN ODT) 4 MG disintegrating tablet Take 1 tablet (4 mg total) by mouth every 8 (eight) hours as needed. Patient not taking: Reported on 01/09/2021 10/02/20   Nita Sickle, MD  prochlorperazine (COMPAZINE) 10 MG tablet Take 1 tablet (10 mg total) by mouth every 8 (eight) hours as needed for nausea or vomiting (headache). Patient not taking: Reported on 01/16/2021 11/10/20   Conard Novak, MD    Allergies: No Known Allergies  Physical Exam: Vitals: Blood pressure (!)  124/97, pulse 90, temperature 98 F (36.7 C), temperature source Oral, resp. rate 20, height 5' (1.524 m), unknown if currently breastfeeding.  Urine Dip Protein: N/A  FHT: 150, moderate, no accels, one potential early vs late incomplete tracing  Toco: not picking up but q2-26min  General: NAD HEENT: normocephalic, anicteric Pulmonary: No increased work of breathing Cardiovascular: RRR, distal pulses 2+ Abdomen: Gravid, non-tender Leopolds:vtx Genitourinary: 6cm Extremities: no edema,  erythema, or tenderness Neurologic: Grossly intact Psychiatric: mood appropriate, affect full  Labs: No results found for this or any previous visit (from the past 24 hour(s)).  Assessment: 25 y.o. G2P1001 [redacted]w[redacted]d by 01/31/2021, by Ultrasound presenting in term labor  Plan: 1) Labor - expectant management  2) Fetus - once IV access obtained obtain more complete tracing currently on hand and knees - normal early and 28 week 1-hr - TWG 19 kg - Pelvis tested to 6lbs 15.5oz  3) PNL - Blood type B/Positive/-- (12/15 1603) / Anti-bodyscreen Negative (12/15 1603) / Rubella 2.28 (12/15 1603) / Varicella Immune / RPR Non Reactive (05/11 1050) / HBsAg Negative (12/15 1603) / HIV Non Reactive (05/11 1050) / GBS Negative/-- (07/05 0849)  4) Immunization History -  Immunization History  Administered Date(s) Administered   HPV Quadrivalent 07/08/2013   Influenza,inj,Quad PF,6+ Mos 07/08/2013   Tdap 03/25/2009, 03/27/2018, 11/24/2020    5) Elevated BP - CMP and P/C ratio  6) Disposition - pending delivery  Vena Austria, MD, [facility], [facility] 01/16/2021, 2:40 PM

## 2021-01-16 NOTE — Discharge Summary (Signed)
OB Discharge Summary     Patient Name: Felicia Jones DOB: Aug 31, 1995 MRN: [id number]  Date of admission: 01/16/2021 Delivering MD: Vena Austria   Date of discharge: 01/18/2021  Admitting diagnosis: Normal labor [O80, Z37.9] Intrauterine pregnancy: [redacted]w[redacted]d     Secondary diagnosis:  Active Problems:   Normal labor   Postpartum care following vaginal delivery  Additional problems: none     Discharge diagnosis: Term Pregnancy Delivered                                                                                                Post partum procedures: none  Augmentation: N/A  Complications: None  Hospital course:  Onset of Labor With Vaginal Delivery      25 y.o. yo G2P1001 at [redacted]w[redacted]d was admitted in Active Labor on 01/16/2021. Patient had an uncomplicated labor course as follows:  Membrane Rupture Time/Date: 3:22 PM ,01/16/2021   Delivery Method:Vaginal, Spontaneous  Episiotomy: None  Lacerations:  None  Patient had an uncomplicated postpartum course.  She is ambulating, tolerating a regular diet, passing flatus, and urinating well. Patient is discharged home in stable condition on 01/18/21.  Newborn Data: Birth date:01/16/2021  Birth time:3:31 PM  Gender:Female  Living status:Living  Apgars:8 ,9  Weight:2990 g   Physical exam  Vitals:   01/17/21 1622 01/17/21 1900 01/17/21 2253 01/18/21 0730  BP: 102/77  (!) 118/56 102/68  Pulse: 71  74 76  Resp: 18  18 18   Temp: 98 F (36.7 C)  98.2 F (36.8 C) 98.4 F (36.9 C)  TempSrc: Oral  Oral Oral  SpO2: 100%  99% 99%  Weight:  97.9 kg    Height:  5' (1.524 m)     General: alert, cooperative, and no distress Lochia: appropriate Uterine Fundus: firm Incision: N/A DVT Evaluation: No evidence of DVT seen on physical exam. Negative Homan's sign. Labs: Lab Results  Component Value Date   WBC 14.5 (H) 01/17/2021   HGB 9.8 (L) 01/17/2021   HCT 30.6 (L) 01/17/2021   MCV 81.0 01/17/2021   PLT 249 01/17/2021   CMP  Latest Ref Rng & Units 01/16/2021  Glucose 70 - 99 mg/dL 01/18/2021)  BUN 6 - 20 mg/dL 12  Creatinine 882(C - 0.03 mg/dL 4.91  Sodium 7.91 - 505 mmol/L 135  Potassium 3.5 - 5.1 mmol/L 3.5  Chloride 98 - 111 mmol/L 107  CO2 22 - 32 mmol/L 22  Calcium 8.9 - 10.3 mg/dL 8.3(L)  Total Protein 6.5 - 8.1 g/dL 6.5  Total Bilirubin 0.3 - 1.2 mg/dL 0.6  Alkaline Phos 38 - 126 U/L 165(H)  AST 15 - 41 U/L 35  ALT 0 - 44 U/L 23    Discharge instruction: per After Visit Summary and "Baby and Me Booklet".  After visit meds:  Allergies as of 01/18/2021   No Known Allergies      Medication List     STOP taking these medications    Doxylamine-Pyridoxine 10-10 MG Tbec Commonly known as: Diclegis   ondansetron 4 MG disintegrating tablet Commonly known as: Zofran ODT   prochlorperazine 10 MG tablet  Commonly known as: COMPAZINE       TAKE these medications    CitraNatal Assure 35-1 & 300 MG tablet Take 2 tablets by mouth daily.   ibuprofen 600 MG tablet Commonly known as: ADVIL Take 1 tablet (600 mg total) by mouth every 6 (six) hours.        Diet: routine diet  Activity: Advance as tolerated. Pelvic rest for 6 weeks.   Outpatient follow up:6 weeks Follow up Appt: No future appointments.  Follow up Visit:No follow-ups on file.  Postpartum contraception: Progesterone only pills  Newborn Data: Live born female  Birth Weight:   APGAR: 8, 9  Newborn Delivery   Birth date/time: 01/16/2021 15:31:00 Delivery type: Vaginal, Spontaneous      Baby Feeding: Breast Disposition:home with mother   01/18/2021 Mirna Mires, CNM

## 2021-01-17 ENCOUNTER — Encounter: Payer: Medicaid Other | Admitting: Obstetrics and Gynecology

## 2021-01-17 ENCOUNTER — Encounter: Payer: Self-pay | Admitting: Obstetrics and Gynecology

## 2021-01-17 LAB — CBC
HCT: 30.6 % — ABNORMAL LOW (ref 36.0–46.0)
Hemoglobin: 9.8 g/dL — ABNORMAL LOW (ref 12.0–15.0)
MCH: 25.9 pg — ABNORMAL LOW (ref 26.0–34.0)
MCHC: 32 g/dL (ref 30.0–36.0)
MCV: 81 fL (ref 80.0–100.0)
Platelets: 249 10*3/uL (ref 150–400)
RBC: 3.78 MIL/uL — ABNORMAL LOW (ref 3.87–5.11)
RDW: 15.5 % (ref 11.5–15.5)
WBC: 14.5 10*3/uL — ABNORMAL HIGH (ref 4.0–10.5)
nRBC: 0 % (ref 0.0–0.2)

## 2021-01-17 LAB — RPR: RPR Ser Ql: NONREACTIVE

## 2021-01-17 MED ORDER — OXYCODONE HCL 5 MG PO TABS
5.0000 mg | ORAL_TABLET | Freq: Four times a day (QID) | ORAL | Status: DC | PRN
Start: 2021-01-17 — End: 2021-01-18

## 2021-01-17 NOTE — Progress Notes (Signed)
Subjective:  Doing well postpartum day 1: she is tolerating regular diet, her pain is controlled with PO medication, she is ambulating and voiding without difficulty, she is breastfeeding. She reports general perineal/pelvic pain and some stinging when urinating.  Objective:  Vital signs in last 24 hours: Temp:  [97.6 F (36.4 C)-98.2 F (36.8 C)] 98 F (36.7 C) (07/18 0736) Pulse Rate:  [66-97] 78 (07/18 0736) Resp:  [18-20] 18 (07/18 0736) BP: (100-151)/(47-97) 102/53 (07/18 0736) SpO2:  [97 %-100 %] 99 % (07/18 0736)    General: NAD Pulmonary: no increased work of breathing Abdomen: non-distended, non-tender, fundus firm at level of umbilicus Extremities: no edema, no erythema, no tenderness Genital: no evidence of hematoma  Results for orders placed or performed during the hospital encounter of 01/16/21 (from the past 72 hour(s))  Resp Panel by RT-PCR (Flu A&B, Covid) Nasopharyngeal Swab     Status: None   Collection Time: 01/16/21  2:37 PM   Specimen: Nasopharyngeal Swab; Nasopharyngeal(NP) swabs in vial transport medium  Result Value Ref Range   SARS Coronavirus 2 by RT PCR NEGATIVE NEGATIVE    Comment: (NOTE) SARS-CoV-2 target nucleic acids are NOT DETECTED.  The SARS-CoV-2 RNA is generally detectable in upper respiratory specimens during the acute phase of infection. The lowest concentration of SARS-CoV-2 viral copies this assay can detect is 138 copies/mL. A negative result does not preclude SARS-Cov-2 infection and should not be used as the sole basis for treatment or other patient management decisions. A negative result may occur with  improper specimen collection/handling, submission of specimen other than nasopharyngeal swab, presence of viral mutation(s) within the areas targeted by this assay, and inadequate number of viral copies(<138 copies/mL). A negative result must be combined with clinical observations, patient history, and  epidemiological information. The expected result is Negative.  Fact Sheet for Patients:  BloggerCourse.com  Fact Sheet for Healthcare Providers:  SeriousBroker.it  This test is no t yet approved or cleared by the [place] FDA and  has been authorized for detection and/or diagnosis of SARS-CoV-2 by FDA under an Emergency Use Authorization (EUA). This EUA will remain  in effect (meaning this test can be used) for the duration of the COVID-19 declaration under Section 564(b)(1) of the Act, 21 U.S.C.section 360bbb-3(b)(1), unless the authorization is terminated  or revoked sooner.       Influenza A by PCR NEGATIVE NEGATIVE   Influenza B by PCR NEGATIVE NEGATIVE    Comment: (NOTE) The Xpert Xpress SARS-CoV-2/FLU/RSV plus assay is intended as an aid in the diagnosis of influenza from Nasopharyngeal swab specimens and should not be used as a sole basis for treatment. Nasal washings and aspirates are unacceptable for Xpert Xpress SARS-CoV-2/FLU/RSV testing.  Fact Sheet for Patients: BloggerCourse.com  Fact Sheet for Healthcare Providers: SeriousBroker.it  This test is not yet approved or cleared by the [place] FDA and has been authorized for detection and/or diagnosis of SARS-CoV-2 by FDA under an Emergency Use Authorization (EUA). This EUA will remain in effect (meaning this test can be used) for the duration of the COVID-19 declaration under Section 564(b)(1) of the Act, 21 U.S.C. section 360bbb-3(b)(1), unless the authorization is terminated or revoked.  Performed at [facility], [address] [place], [place] [address]   CBC     Status: Abnormal   Collection Time: 01/16/21  3:08 PM  Result Value Ref Range   WBC 12.1 (H) 4.0 - 10.5 K/uL   RBC 4.35 3.87 - 5.11 MIL/uL  Hemoglobin 11.0 (L) 12.0 - 15.0 g/dL   HCT 70.6 (L) 23.7 - 62.8 %   MCV 79.8 (L)  80.0 - 100.0 fL   MCH 25.3 (L) 26.0 - 34.0 pg   MCHC 31.7 30.0 - 36.0 g/dL   RDW 31.5 17.6 - 16.0 %   Platelets 308 150 - 400 K/uL   nRBC 0.0 0.0 - 0.2 %    Comment: Performed at [facility], [address], [place], [place] [address]  Type and screen [facility]     Status: None   Collection Time: 01/16/21  3:08 PM  Result Value Ref Range   ABO/RH(D) B POS    Antibody Screen NEG    Sample Expiration      01/19/2021,2359 Performed at [facility], [address] [place], [place] [address]   Comprehensive metabolic panel     Status: Abnormal   Collection Time: 01/16/21  3:08 PM  Result Value Ref Range   Sodium 135 135 - 145 mmol/L   Potassium 3.5 3.5 - 5.1 mmol/L   Chloride 107 98 - 111 mmol/L   CO2 22 22 - 32 mmol/L   Glucose, Bld 126 (H) 70 - 99 mg/dL    Comment: Glucose reference range applies only to samples taken after fasting for at least 8 hours.   BUN 12 6 - 20 mg/dL   Creatinine, Ser 8.54 0.44 - 1.00 mg/dL   Calcium 8.3 (L) 8.9 - 10.3 mg/dL   Total Protein 6.5 6.5 - 8.1 g/dL   Albumin 3.0 (L) 3.5 - 5.0 g/dL   AST 35 15 - 41 U/L   ALT 23 0 - 44 U/L   Alkaline Phosphatase 165 (H) 38 - 126 U/L   Total Bilirubin 0.6 0.3 - 1.2 mg/dL   GFR, Estimated >62 >70 mL/min    Comment: (NOTE) Calculated using the CKD-EPI Creatinine Equation (2021)    Anion gap 6 5 - 15    Comment: Performed at [facility], [address] [place], [place] [address]  CBC     Status: Abnormal   Collection Time: 01/17/21  3:56 AM  Result Value Ref Range   WBC 14.5 (H) 4.0 - 10.5 K/uL   RBC 3.78 (L) 3.87 - 5.11 MIL/uL   Hemoglobin 9.8 (L) 12.0 - 15.0 g/dL   HCT 38.1 (L) 82.9 - 93.7 %   MCV 81.0 80.0 - 100.0 fL   MCH 25.9 (L) 26.0 - 34.0 pg   MCHC 32.0 30.0 - 36.0 g/dL   RDW 16.9 67.8 - 93.8 %   Platelets 249 150 - 400 K/uL   nRBC 0.0 0.0 - 0.2 %    Comment: Performed at [facility], [address] [place], [place] [address]     Assessment:   25 y.o. [phone] postpartum day # 1, lactating  Plan:    1) Acute blood loss anemia - hemodynamically stable and asymptomatic - po ferrous sulfate  2) Blood Type --/--/B POS (07/17 1508) / Rubella 2.28 (12/15 1603) / Varicella Immune  3) TDAP status  given antepartum  4) Feeding plan breast  5)  Education given regarding options for contraception, as well as compatibility with breast feeding if applicable.  Patient plans on ring or patch for contraception after initial POPs while breastfeeding.  6) Disposition: continue current care   Tresea Mall, CNM [facility] 01/17/2021, 10:00 AM

## 2021-01-17 NOTE — Progress Notes (Signed)
   01/17/21 1000  Clinical Encounter Type  Visited With Patient and family together  Visit Type Initial  Referral From Chaplain  Consult/Referral To Chaplain  Spiritual Encounters  Spiritual Needs Emotional  Chaplain Sharmeka Palmisano visited room 341A Pt, Felicia Jones. Pt's significant other and her newborn baby boy was in the room with her. Pt stated she had a sucessful delivery but it hurt because she did not receive any pain meds due to the fact it happened so quickly. She and her baby is doing well and she is looking forward to discharging on tomorrow. I provided a ministry of presence and support.

## 2021-01-18 MED ORDER — IBUPROFEN 600 MG PO TABS: 600.0000 mg | ORAL_TABLET | Freq: Four times a day (QID) | ORAL | 0 refills | Status: DC

## 2021-01-18 NOTE — Discharge Instructions (Signed)

## 2021-01-18 NOTE — Progress Notes (Signed)
Mother discharged.  Discharge instructions given.  Mother verbalizes understanding.  Will be transported by auxiliary once transportation arrives. 

## 2021-01-31 ENCOUNTER — Inpatient Hospital Stay: Admit: 2021-01-31 | Payer: Self-pay

## 2021-02-24 ENCOUNTER — Telehealth: Payer: Self-pay | Admitting: Licensed Clinical Social Worker

## 2021-02-24 NOTE — Telephone Encounter (Signed)
-----   Message from [facility] sent at 02/24/2021  3:52 PM EDT ----- Regarding: Referral - postpartum depression/anxiety This client of mine who delivered about 5 weeks ago reached out to me today with concerns regarding her mood. She had EPDS of 19 (no SI). She is wanting to start counseling. I told her all of the options and she wanted to schedule with you. So sending this referral your way!

## 2021-03-04 ENCOUNTER — Encounter: Payer: Self-pay | Admitting: Obstetrics and Gynecology

## 2021-03-04 ENCOUNTER — Other Ambulatory Visit: Payer: Self-pay

## 2021-03-04 ENCOUNTER — Ambulatory Visit (INDEPENDENT_AMBULATORY_CARE_PROVIDER_SITE_OTHER): Payer: Medicaid Other | Admitting: Obstetrics and Gynecology

## 2021-03-04 DIAGNOSIS — M654 Radial styloid tenosynovitis [de Quervain]: Secondary | ICD-10-CM

## 2021-03-04 MED ORDER — NORETHINDRONE 0.35 MG PO TABS: 1.0000 | ORAL_TABLET | Freq: Every day | ORAL | 3 refills | Status: DC

## 2021-03-04 NOTE — Progress Notes (Signed)
Postpartum Visit  Chief Complaint: No chief complaint on file.   History of Present Illness: Patient is a 25 y.o. [id number] presents for postpartum visit.  Date of delivery: 01/16/2021 Type of delivery: Vaginal delivery - Vacuum or forceps assisted  no Episiotomy No.  Laceration: no  Pregnancy or labor problems:  no Any problems since the delivery:  yes Has noted numbness in right wrist thumb as well as left.  In left goes up into the forearm.  Is breastfeeding  Newborn Details:  SINGLETON :  1. BabyGender female. Birth weight: 2990g Maternal Details:  Breast or formula feeding: plans to breastfeed  Contraception after delivery: No  Any bowel or bladder issues: No  Post partum depression/anxiety noted:  no Edinburgh Post-Partum Depression Score:9 Date of last PAP: 06/26/2020 NILM    Review of Systems: Review of Systems  Constitutional: Negative.   Gastrointestinal: Negative.   Genitourinary: Negative.   Psychiatric/Behavioral: Negative.     The following portions of the patient's history were reviewed and updated as appropriate: allergies, current medications, past family history, past medical history, past social history, past surgical history, and problem list.  Past Medical History:  Past Medical History:  Diagnosis Date   Arthritis    in back   Migraines     Past Surgical History:  Past Surgical History:  Procedure Laterality Date   WISDOM TOOTH EXTRACTION      Family History:  Family History  Problem Relation Age of Onset   Migraines Mother    Diabetes Maternal Grandmother    Hypertension Maternal Grandmother    Diabetes Maternal Grandfather    Hypertension Maternal Grandfather     Social History:  Social History   Socioeconomic History   Marital status: Single    Spouse name: Not on file   Number of children: Not on file   Years of education: 12   Highest education level: Not on file  Occupational History   Occupation: Agricultural engineer     Comment: [employer]  Tobacco Use   Smoking status: Never   Smokeless tobacco: Never  Vaping Use   Vaping Use: Never used  Substance and Sexual Activity   Alcohol use: No   Drug use: No   Sexual activity: Yes    Birth control/protection: None  Other Topics Concern   Not on file  Social History Narrative   Not on file   Social Determinants of Health   Financial Resource Strain: Not on file  Food Insecurity: Not on file  Transportation Needs: Not on file  Physical Activity: Not on file  Stress: Not on file  Social Connections: Not on file  Intimate Partner Violence: Not on file    Allergies:  No Known Allergies  Medications: Prior to Admission medications   Medication Sig Start Date End Date Taking? Authorizing Provider  ibuprofen (ADVIL) 600 MG tablet Take 1 tablet (600 mg total) by mouth every 6 (six) hours. 01/18/21   Mirna Mires, CNM  Prenat w/o A-FeCbGl-DSS-FA-DHA (CITRANATAL ASSURE) 35-1 & 300 MG tablet Take 2 tablets by mouth daily. 07/21/20   Zipporah Plants, CNM    Physical Exam unknown if currently breastfeeding.    General: NAD HEENT: normocephalic, anicteric Pulmonary: No increased work of breathing Abdomen: NABS, soft, non-tender, non-distended.  Umbilicus without lesions.  No hepatomegaly, splenomegaly or masses palpable. No evidence of hernia. Genitourinary:  External: Normal external female genitalia.  Normal urethral meatus, normal  Bartholin's and Skene's glands.    Vagina:  Normal vaginal mucosa, no evidence of prolapse.    Cervix: Grossly normal in appearance, no bleeding  Uterus: Non-enlarged, mobile, normal contour.  No CMT  Adnexa: ovaries non-enlarged, no adnexal masses  Rectal: deferred Extremities: no edema, erythema, or tenderness Neurologic: Grossly intact Psychiatric: mood appropriate, affect full    Edinburgh Postnatal Depression Scale - 03/04/21 1021       Edinburgh Postnatal Depression Scale:  In the Past 7 Days   I have been  able to laugh and see the funny side of things. 0    I have looked forward with enjoyment to things. 0    I have blamed myself unnecessarily when things went wrong. 2    I have been anxious or worried for no good reason. 2    I have felt scared or panicky for no good reason. 2    Things have been getting on top of me. 1    I have been so unhappy that I have had difficulty sleeping. 0    I have felt sad or miserable. 1    I have been so unhappy that I have been crying. 1    The thought of harming myself has occurred to me. 0    Edinburgh Postnatal Depression Scale Total 9              Assessment: 25 y.o. [id number] presenting for 6 week postpartum visit  Plan: Problem List Items Addressed This Visit       Other   Postpartum care following vaginal delivery - Primary   Other Visit Diagnoses     6 weeks postpartum follow-up       De Quervain's disease (tenosynovitis)       Relevant Orders   AMB referral to orthopedics        1) Contraception - Education given regarding options for contraception, as well as compatibility with breast feeding if applicable.  Patient plans on oral progesterone-only contraceptive for contraception.  2)  Pap - ASCCP guidelines and rational discussed.  ASCCP guidelines and rational discussed.  Patient opts for every 3 years screening interval  3) Patient underwent screening for postpartum depression with no signs of depression  4) Tenderness numbness in bilateral wrist/snuff box and numbness in the left forearm - will send to orthopedics for further evalution.  Supect component of De Quervaine's but unclear why she also has symptom in forearm on left  5)  No follow-ups on file.   Vena Austria, MD, [facility], [facility] 03/04/2021, 10:16 AM

## 2021-03-17 ENCOUNTER — Ambulatory Visit: Payer: Medicaid Other | Admitting: Licensed Clinical Social Worker

## 2021-03-25 ENCOUNTER — Other Ambulatory Visit: Payer: Self-pay | Admitting: Obstetrics

## 2021-03-28 ENCOUNTER — Other Ambulatory Visit: Payer: Self-pay

## 2021-03-31 ENCOUNTER — Ambulatory Visit: Payer: Medicaid Other | Admitting: Licensed Clinical Social Worker

## 2021-04-06 ENCOUNTER — Ambulatory Visit: Payer: Medicaid Other | Admitting: Licensed Clinical Social Worker

## 2021-04-06 ENCOUNTER — Encounter: Payer: Self-pay | Admitting: Licensed Clinical Social Worker

## 2021-04-06 DIAGNOSIS — F411 Generalized anxiety disorder: Secondary | ICD-10-CM
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
## 2021-04-13 ENCOUNTER — Ambulatory Visit: Payer: Medicaid Other | Admitting: Licensed Clinical Social Worker

## 2021-04-13 DIAGNOSIS — F411 Generalized anxiety disorder: Secondary | ICD-10-CM

## 2021-04-13 NOTE — Progress Notes (Signed)
Counselor/Therapist Progress Note  Patient ID: Felicia Jones, MRN: [id number],    Date: 04/13/2021  Time Spent: 50 minutes    Treatment Type: Psychotherapy  Reported Symptoms:  Anxiety, anxious thoughts  Mental Status Exam:  Appearance:   Casual and Well Groomed     Behavior:  Appropriate and Sharing  Motor:  Normal  Speech/Language:   Normal Rate  Affect:  Appropriate, Congruent, and Full Range  Mood:  normal  Thought process:  normal  Thought content:    WNL  Sensory/Perceptual disturbances:    WNL  Orientation:  oriented to person, place, time/date, and situation  Attention:  Good  Concentration:  Good  Memory:  WNL  Fund of knowledge:   Good  Insight:    Good  Judgment:   Good  Impulse Control:  Good   Risk Assessment: Danger to Self:  No Self-injurious Behavior: No Danger to Others: No Duty to Warn:no Physical Aggression / Violence:No  Access to Firearms a concern: No  Gang Involvement:No   Subjective: Patient was engaged and cooperative throughout the session using time effectively to discuss thoughts and feelings, and treatment plan. Patient was receptive to feedback and intervention from LCSW. Patient voices continued motivation for treatment and understanding of anxiety issues. Patient is likely to benefit from future treatment because they remain motivated to decrease anxiety.    Interventions: Cognitive Behavioral Therapy Checked in with patient and reviewed previous session, continuing to explore current stressors and goal of treatment. Reviewed CBTs. Worked collaboratively to develop CBTs treatment plan. Provided support through active listening, validation of feelings, and highlighted patient's strengths.   Diagnosis:   ICD-10-CM   1. Generalized anxiety disorder  F41.1      Plan: Patient's goal of treatment is patient's goal is to manage her emotions, wants to be able to communicate better with her mom   Treatment Target: Understand the relationship  between thoughts, emotions, and behaviors  Psychoeducation on CBT model   Teach the connection between thoughts, emotions, and behaviors  Treatment Target: Increase realistic balanced thinking -to learn how to replace thinking with thoughts that are more accurate or helpful Explore patient's thoughts, beliefs, automatic thoughts, assumptions  Identify and replace unhelpful thinking patterns (upsetting ideas, self-talk and mental images) Process distress and trauma and allow for emotional release  Questioning and challenging thoughts Cognitive reappraisal  Restructuring, Socratic questioning  Treatment Target: Reducing vulnerability to "emotional mind" Values clarification and goal identification  Self-care - nutrition, sleep, exercise  Increase positive and pleasurable events  Mindfulness practices   Future Appointments  Date Time Provider Department Center  04/27/2021  2:30 PM Kathreen Cosier, LCSW [facility] None    Kathreen Cosier, LCSW

## 2021-04-27 ENCOUNTER — Ambulatory Visit: Payer: Medicaid Other | Admitting: Licensed Clinical Social Worker

## 2021-04-27 DIAGNOSIS — F411 Generalized anxiety disorder: Secondary | ICD-10-CM

## 2021-04-27 NOTE — Progress Notes (Signed)
Counselor/Therapist Progress Note  Patient ID: Felicia Jones, MRN: [id number],    Date: 04/27/2021  Time Spent: 45 minutes    Treatment Type: Psychotherapy  Reported Symptoms:  Anxiety, anxious thoughts  Mental Status Exam:  Appearance:   Casual and Well Groomed     Behavior:  Appropriate and Sharing  Motor:  Normal  Speech/Language:   Normal Rate  Affect:  Appropriate, Congruent, and Full Range  Mood:  normal  Thought process:  normal  Thought content:    WNL  Sensory/Perceptual disturbances:    WNL  Orientation:  oriented to person, place, time/date, and situation  Attention:  Good  Concentration:  Good  Memory:  WNL  Fund of knowledge:   Good  Insight:    Good  Judgment:   Good  Impulse Control:  Good   Risk Assessment: Danger to Self:  No Self-injurious Behavior: No Danger to Others: No Duty to Warn:no Physical Aggression / Violence:No  Access to Firearms a concern: No  Gang Involvement:No   Subjective: Patient was receptive to feedback and intervention from LCSW. Patient voices continued motivation for treatment and understanding of  . Patient is likely to benefit from future treatment because they remain motivated to decrease anxiety and reports benefit of regular sessions.       Interventions: Cognitive Behavioral Therapy Checked in with patient regarding their week. LCSW explored patient's current psychosocial stressors, continued anxiety due to multiple stressors. 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 Encouraged patient to practice challenging the "What Ifs" and to use the Wise Mind exercise to identify emotions vs rational thoughts. Provided support through active listening, validation of feelings, and highlighted patient's strengths.    Diagnosis:   ICD-10-CM   1. Generalized  anxiety disorder  F41.1      Plan: Patient's goal of treatment is patient's goal is to manage her emotions, wants to be able to communicate better with her mom    Treatment Target: Understand the relationship between thoughts, emotions, and behaviors  Psychoeducation on CBT model   Teach the connection between thoughts, emotions, and behaviors  Treatment Target: Increase realistic balanced thinking -to learn how to replace thinking with thoughts that are more accurate or helpful Explore patient's thoughts, beliefs, automatic thoughts, assumptions  Identify and replace unhelpful thinking patterns (upsetting ideas, self-talk and mental images) Process distress and trauma and allow for emotional release  Questioning and challenging thoughts Cognitive reappraisal  Restructuring, Socratic questioning  Treatment Target: Reducing vulnerability to "emotional mind" Values clarification and goal identification  Self-care - nutrition, sleep, exercise  Increase positive and pleasurable events  Mindfulness practices   Future Appointments  Date Time Provider Department Center  05/05/2021 10:30 AM Kathreen Cosier, LCSW [facility] None    Lelon Huh, MSW [facility] Intern present in session with patient's verbal consent.   Kathreen Cosier, LCSW

## 2021-05-05 ENCOUNTER — Ambulatory Visit: Payer: Medicaid Other | Admitting: Licensed Clinical Social Worker

## 2021-05-05 DIAGNOSIS — F411 Generalized anxiety disorder: Secondary | ICD-10-CM

## 2021-05-05 NOTE — Progress Notes (Signed)
Counselor/Therapist Progress Note  Patient ID: Felicia Jones, MRN: [id number],    Date: 05/05/2021  Time Spent: 55 minutes   Treatment Type: Psychotherapy  Reported Symptoms:  Anxiety, anxious thoughts  Mental Status Exam:  Appearance:   Casual and Well Groomed     Behavior:  Appropriate and Sharing  Motor:  Normal  Speech/Language:   Clear and Coherent and Normal Rate  Affect:  Appropriate, Congruent, and Full Range  Mood:  euthymic  Thought process:  normal  Thought content:    WNL  Sensory/Perceptual disturbances:    WNL  Orientation:  oriented to person, place, time/date, and situation  Attention:  Good  Concentration:  Good  Memory:  WNL  Fund of knowledge:   Good  Insight:    Good  Judgment:   Good  Impulse Control:  Good   Risk Assessment: Danger to Self:  No Self-injurious Behavior: No Danger to Others: No Duty to Warn:no Physical Aggression / Violence:No  Access to Firearms a concern: No  Gang Involvement:No   Subjective: Patient was receptive to feedback and intervention from LCSW and actively and effectively participated throughout the session. Patient is likely to benefit from future treatment because they remain motivated to decrease anxiety and reports benefit of regular sessions.       Interventions: Cognitive Behavioral Therapy and Mindfulness Meditation Checked in with patient regarding their week. LCSW assisted patient in processing their thoughts and emotions regarding co-parenting. LCSW explored patient's perceptions of these parenting challenges leading to feelings of frustration, highlighting unhelpful thoughts and discussing flexibility in thinking. Provided Psychoeducation on mindfulness, engaged patient in mindfulness exercise, processed exercise, and contracted with patient to complete daily. Provided support through active listening, validation of feelings, and highlighted patient's strengths.    Diagnosis:   ICD-10-CM   1. Generalized anxiety  disorder  F41.1      Plan: Patient's goal of treatment is patient's goal is to manage her emotions, wants to be able to communicate better with her mom    Treatment Target: Understand the relationship between thoughts, emotions, and behaviors  Psychoeducation on CBT model   Teach the connection between thoughts, emotions, and behaviors  Treatment Target: Increase realistic balanced thinking -to learn how to replace thinking with thoughts that are more accurate or helpful Explore patient's thoughts, beliefs, automatic thoughts, assumptions  Identify and replace unhelpful thinking patterns (upsetting ideas, self-talk and mental images) Process distress and trauma and allow for emotional release  Questioning and challenging thoughts Cognitive reappraisal  Restructuring, Socratic questioning  Treatment Target: Reducing vulnerability to "emotional mind" Values clarification and goal identification  Self-care - nutrition, sleep, exercise  Increase positive and pleasurable events  Mindfulness practices   Future Appointments  Date Time Provider Department Center  05/12/2021  1:10 PM Kathreen Cosier, LCSW [facility] None    Lelon Huh, MSW [facility] Intern present with patient's consent.  Kathreen Cosier, LCSW

## 2021-05-12 ENCOUNTER — Ambulatory Visit: Payer: Medicaid Other | Admitting: Licensed Clinical Social Worker

## 2021-05-12 DIAGNOSIS — F411 Generalized anxiety disorder: Secondary | ICD-10-CM

## 2021-05-12 NOTE — Progress Notes (Signed)
Counselor/Therapist Progress Note  Patient ID: Felicia Jones, MRN: [id number],    Date: 05/12/2021  Time Spent: 55 minutes   Treatment Type: Psychotherapy  Reported Symptoms:  anxiety, anxious thoughts  Mental Status Exam:  Appearance:   Casual, Neat, and Well Groomed     Behavior:  Appropriate and Sharing  Motor:  Normal  Speech/Language:   Clear and Coherent and Normal Rate  Affect:  Appropriate, Congruent, and Full Range  Mood:  euthymic  Thought process:  normal  Thought content:    WNL  Sensory/Perceptual disturbances:    WNL  Orientation:  oriented to person, place, time/date, and situation  Attention:  Good  Concentration:  Good  Memory:  WNL  Fund of knowledge:   Good  Insight:    Good  Judgment:   Good  Impulse Control:  Good   Risk Assessment: Danger to Self:  No Self-injurious Behavior: No Danger to Others: No Duty to Warn:no Physical Aggression / Violence:No  Access to Firearms a concern: No  Gang Involvement:No   Subjective: Patient was receptive to feedback and intervention from LCSW and actively and effectively participated throughout the session.  Patient voices continued motivation for treatment and understanding of anxiety issues. Patient is likely to benefit from future treatment because she remains motivated to decrease anxiety and  reports benefit of regular sessions in addressing anxiety symptoms.     Interventions: Cognitive Behavioral Therapy Checked in with patient regarding her week. Engaged patient in processing current psychosocial stressors-continued anxiety and frustration in co-parenting relationship with children's father and with his family. Validated patient's feelings of frustration, and identified origins of these feelings. Assisted patient in problem solving options to resolve current conflict while continuing to live within her values. Reviewed thoughts and feelings and cognitive reframing. Provided support through active listening,  validation of feelings, and highlighted patient's strengths.    Diagnosis:   ICD-10-CM   1. Generalized anxiety disorder  F41.1      Plan: Patient's goal of treatment is patient's goal is to manage her emotions, wants to be able to communicate better with her mom    Treatment Target: Understand the relationship between thoughts, emotions, and behaviors  Psychoeducation on CBT model   Teach the connection between thoughts, emotions, and behaviors  Treatment Target: Increase realistic balanced thinking -to learn how to replace thinking with thoughts that are more accurate or helpful Explore patient's thoughts, beliefs, automatic thoughts, assumptions  Identify and replace unhelpful thinking patterns (upsetting ideas, self-talk and mental images) Process distress and trauma and allow for emotional release  Questioning and challenging thoughts Cognitive reappraisal  Restructuring, Socratic questioning  Treatment Target: Reducing vulnerability to "emotional mind" Values clarification and goal identification  Self-care - nutrition, sleep, exercise  Increase positive and pleasurable events  Mindfulness practices   Future Appointments  Date Time Provider Department Center  05/18/2021  3:30 PM Kathreen Cosier, LCSW [facility] None  06/02/2021  1:00 PM Kathreen Cosier, LCSW [facility] None    Kathreen Cosier, LCSW

## 2021-05-18 ENCOUNTER — Ambulatory Visit: Payer: Medicaid Other | Admitting: Licensed Clinical Social Worker

## 2021-05-18 DIAGNOSIS — F411 Generalized anxiety disorder: Secondary | ICD-10-CM

## 2021-05-18 NOTE — Progress Notes (Signed)
Counselor/Therapist Progress Note  Patient ID: Felicia Jones, MRN: [id number],    Date: 05/18/2021  Time Spent: 42 minutes    Treatment Type: Psychotherapy  Reported Symptoms:  Anxiety, anxious thoughts  Mental Status Exam:  Appearance:   Neat and Well Groomed     Behavior:  Appropriate and Sharing  Motor:  Normal  Speech/Language:   Clear and Coherent and Normal Rate  Affect:  Appropriate, Congruent, and Full Range  Mood:  normal  Thought process:  normal  Thought content:    WNL  Sensory/Perceptual disturbances:    WNL  Orientation:  oriented to person, place, time/date, and situation  Attention:  Good  Concentration:  Good  Memory:  WNL  Fund of knowledge:   Good  Insight:    Good  Judgment:   Good  Impulse Control:  Good   Risk Assessment: Danger to Self:  No Self-injurious Behavior: No Danger to Others: No Duty to Warn:no Physical Aggression / Violence:No  Access to Firearms a concern: No  Gang Involvement:No   Subjective: Patient was engaged and cooperative throughout the session using time effectively to discuss thoughts and feelings. Patient was receptive to feedback and intervention from LCSW. Patient voices continued motivation for treatment and understanding of anxiety issues. Patient is likely to benefit from future treatment because they remain motivated to decrease  Anxiety and reports benefit of regular sessions.        Interventions: Cognitive Behavioral Therapy Established psychological safety. Checked in with patient regarding their week. Engaged patient in processing current psychosocial stressors, continued anxiety due to interactions with the family of her children. Validated patient's feelings of frustration. LCSW reviewed effective communication skills with patient. Provided support through active listening, validation of feelings, and highlighted patient's strengths.    Diagnosis:   ICD-10-CM   1. Generalized anxiety disorder  F41.1      Plan:  Patient's goal of treatment is patient's goal is to manage her emotions, wants to be able to communicate better with her mom    Treatment Target: Understand the relationship between thoughts, emotions, and behaviors  Psychoeducation on CBT model   Teach the connection between thoughts, emotions, and behaviors  Treatment Target: Increase realistic balanced thinking -to learn how to replace thinking with thoughts that are more accurate or helpful Explore patient's thoughts, beliefs, automatic thoughts, assumptions  Identify and replace unhelpful thinking patterns (upsetting ideas, self-talk and mental images) Process distress and trauma and allow for emotional release  Questioning and challenging thoughts Cognitive reappraisal  Restructuring, Socratic questioning  Treatment Target: Reducing vulnerability to "emotional mind" Values clarification and goal identification  Self-care - nutrition, sleep, exercise  Increase positive and pleasurable events  Mindfulness practices  Treatment Target: Increase skills Assertiveness communication    Future Appointments  Date Time Provider Department Center  06/02/2021  1:00 PM Kathreen Cosier, LCSW [facility] None    Kathreen Cosier, LCSW

## 2021-06-02 ENCOUNTER — Ambulatory Visit: Payer: Medicaid Other | Admitting: Licensed Clinical Social Worker

## 2021-06-02 DIAGNOSIS — F411 Generalized anxiety disorder: Secondary | ICD-10-CM

## 2021-06-02 NOTE — Progress Notes (Signed)
Counselor/Therapist Progress Note  Patient ID: Felicia Jones, MRN: [id number],    Date: 06/02/2021  Time Spent: 50 minutes   Treatment Type: Psychotherapy  Reported Symptoms:  Anxiety, anxious thoughts  Mental Status Exam:  Appearance:   Casual and Neat     Behavior:  Appropriate and Sharing  Motor:  Normal  Speech/Language:   Clear and Coherent and Normal Rate  Affect:  Appropriate, Congruent, and Full Range  Mood:  normal  Thought process:  normal  Thought content:    WNL  Sensory/Perceptual disturbances:    WNL  Orientation:  oriented to person, place, time/date, and situation  Attention:  Good  Concentration:  Good  Memory:  WNL  Fund of knowledge:   Good  Insight:    Good  Judgment:   Good  Impulse Control:  Good   Risk Assessment: Danger to Self:  No Self-injurious Behavior: No Danger to Others: No Duty to Warn:no Physical Aggression / Violence:No  Access to Firearms a concern: No  Gang Involvement:No   Subjective: Patient was engaged and cooperative throughout the session using time effectively to discuss thoughts and feelings. Patient was receptive to feedback and intervention from LCSW. Patient is likely to benefit from future treatment because they remain motivated to decrease anxiety and reports benefit of regular sessions.        Interventions: Cognitive Behavioral Therapy Checked in with patient regarding their week. LCSW assisted patient in processing their thoughts and emotions regarding challenges in relationship with her children's extended family. Explored patient's perception of these relationship challenges highlighting and reframing unhelpful thoughts and praised patient for using assertiveness communication to express her feelings. Encouraged patient to continue to set boundaries that align with her values. Briefly shared information about radical acceptance and discussed putting this on the agenda for next session.  Provided support through active  listening, validation of feelings, and highlighted patient's strengths.    Diagnosis:   ICD-10-CM   1. Generalized anxiety disorder  F41.1      Plan:  Continue to share information about Radical Acceptance   Patient's goal of treatment is patient's goal is to manage her emotions, wants to be able to communicate better with her mom    Treatment Target: Understand the relationship between thoughts, emotions, and behaviors  Psychoeducation on CBT model   Teach the connection between thoughts, emotions, and behaviors  Treatment Target: Increase realistic balanced thinking -to learn how to replace thinking with thoughts that are more accurate or helpful Explore patient's thoughts, beliefs, automatic thoughts, assumptions  Identify and replace unhelpful thinking patterns (upsetting ideas, self-talk and mental images) Process distress and trauma and allow for emotional release  Questioning and challenging thoughts Cognitive reappraisal  Restructuring, Socratic questioning  Treatment Target: Reducing vulnerability to "emotional mind" Values clarification and goal identification  Self-care - nutrition, sleep, exercise  Increase positive and pleasurable events  Mindfulness practices  Treatment Target: Increase skills Assertiveness communication  Boundary setting   Future Appointments  Date Time Provider Department Center  06/09/2021 11:20 AM Kathreen Cosier, LCSW [facility] None    Kathreen Cosier, LCSW

## 2021-06-09 ENCOUNTER — Ambulatory Visit: Payer: Medicaid Other | Admitting: Licensed Clinical Social Worker

## 2021-06-09 NOTE — Progress Notes (Unsigned)
Counselor/Therapist Progress Note  Patient ID: Felicia Jones, MRN: [id number],    Date: 06/09/2021  Time Spent: ***   Treatment Type: Psychotherapy  Reported Symptoms: {CHL AMB Reported Symptoms:[phone]}  Mental Status Exam:  Appearance:   {PSY:22683}     Behavior:  {PSY:[id number]}  Motor:  {PSY:22302}  Speech/Language:   {PSY:22685}  Affect:  {PSY:22687}  Mood:  {PSY:31886}  Thought process:  {PSY:31888}  Thought content:    {PSY:[phone]}  Sensory/Perceptual disturbances:    {[phone]}  Orientation:  {PSY:30297}  Attention:  {PSY:22877}  Concentration:  {[phone]}  Memory:  {PSY:[id number]}  Fund of knowledge:   {[phone]}  Insight:    {[phone]}  Judgment:   {[phone]}  Impulse Control:  {[phone]}   Risk Assessment: Danger to Self:  No Self-injurious Behavior: No Danger to Others: No Duty to Warn:no Physical Aggression / Violence:No  Access to Firearms a concern: No  Gang Involvement:No   Subjective: Patient was engaged and cooperative throughout the session using time effectively to discuss   Patient voices continued motivation for treatment and understanding of  . Patient is likely to benefit from future treatment because  remains motivated to decrease  And   and reports benefit of regular sessions in addressing these symptoms.   Interventions: Cognitive Behavioral Therapy  Diagnosis:   ICD-10-CM   1. Generalized anxiety disorder  F41.1       Plan:  Continue to share information about Radical Acceptance    Patient's goal of treatment is patient's goal is to manage her emotions, wants to be able to communicate better with her mom    Treatment Target: Understand the relationship between thoughts, emotions, and behaviors  Psychoeducation on CBT model   Teach the connection between thoughts, emotions, and behaviors  Treatment Target: Increase realistic balanced thinking -to learn how to replace thinking with thoughts  that are more accurate or helpful Explore patient's thoughts, beliefs, automatic thoughts, assumptions  Identify and replace unhelpful thinking patterns (upsetting ideas, self-talk and mental images) Process distress and trauma and allow for emotional release  Questioning and challenging thoughts Cognitive reappraisal  Restructuring, Socratic questioning  Treatment Target: Reducing vulnerability to "emotional mind" Values clarification and goal identification  Self-care - nutrition, sleep, exercise  Increase positive and pleasurable events  Mindfulness practices  Treatment Target: Increase skills Assertiveness communication  Boundary setting   Future Appointments  Date Time Provider Department Center  06/09/2021 11:20 AM Kathreen Cosier, LCSW [facility] None   Kathreen Cosier, LCSW

## 2021-07-27 ENCOUNTER — Ambulatory Visit: Payer: Medicaid Other | Admitting: Licensed Clinical Social Worker

## 2021-07-27 DIAGNOSIS — F411 Generalized anxiety disorder: Secondary | ICD-10-CM

## 2021-07-27 NOTE — Plan of Care (Signed)
Added treatment plan to NEW Treatment plan format

## 2021-07-27 NOTE — Plan of Care (Signed)
LCSW transferred patient's treatment plan into this New Treatment plan "form/template"

## 2021-07-27 NOTE — Progress Notes (Signed)
Counselor/Therapist Progress Note  Patient ID: Felicia Jones, MRN: [id number],    Date: 07/27/2021  Time Spent: 1 hour   Treatment Type: Psychotherapy  Reported Symptoms:  Anxiety, anxious thoughts  Mental Status Exam:  Appearance:   Casual and Neat     Behavior:  Appropriate and Sharing  Motor:  Normal  Speech/Language:   Clear and Coherent and Normal Rate  Affect:  Appropriate, Congruent, and Full Range  Mood:  normal  Thought process:  normal  Thought content:    WNL  Sensory/Perceptual disturbances:    WNL  Orientation:  oriented to person, place, time/date, and situation  Attention:  Good  Concentration:  Good  Memory:  WNL  Fund of knowledge:   Good  Insight:    Good  Judgment:   Good  Impulse Control:  Good   Risk Assessment: Danger to Self:  No Self-injurious Behavior: No Danger to Others: No Duty to Warn:no Physical Aggression / Violence:No  Access to Firearms a concern: No  Gang Involvement:No   Subjective: Patient was engaged and cooperative throughout the session using time effectively to discuss thoughts and feelings. Patient was receptive to feedback and intervention from LCSW. Patient is likely to benefit from future treatment because they remain motivated to decrease symptoms of distress and reports benefit of sessions.       Interventions: Cognitive Behavioral Therapy and Client centered Checked in with patient regarding current psychosocial stressors. LCSW assisted patient in processing their thoughts and emotions about what they have experienced in co-parenting with father of her children. LCSW validated patient's feelings of fear and discussed the link with childhood trauma. LCSW highlighted patient's unhelpful thoughts leading to anxiety and reframed these thought patterns. Provided support through active listening, validation of feelings, and highlighted patient's strengths.    Diagnosis:   ICD-10-CM   1. Generalized anxiety disorder  F41.1       Plan: Patient's goal of treatment is patient's goal is to manage her emotions, wants to be able to communicate better with her mom    Treatment Target: Understand the relationship between thoughts, emotions, and behaviors  Psychoeducation on CBT model   Teach the connection between thoughts, emotions, and behaviors  Treatment Target: Increase realistic balanced thinking -to learn how to replace thinking with thoughts that are more accurate or helpful Explore patient's thoughts, beliefs, automatic thoughts, assumptions  Identify and replace unhelpful thinking patterns (upsetting ideas, self-talk and mental images) Process distress and trauma and allow for emotional release  Questioning and challenging thoughts Cognitive reappraisal  Restructuring, Socratic questioning  Treatment Target: Reducing vulnerability to emotional mind Values clarification and goal identification  Self-care - nutrition, sleep, exercise  Increase positive and pleasurable events  Mindfulness practices  Treatment Target: Increase skills Assertiveness communication  Boundary setting   Future Appointments  Date Time Provider Department Center  08/04/2021 10:40 AM Kathreen Cosier, LCSW [facility] None   Lelon Huh, MSW [facility] Intern present with patient's verbal consent.   Kathreen Cosier, LCSW

## 2021-08-04 ENCOUNTER — Ambulatory Visit: Payer: Medicaid Other | Admitting: Licensed Clinical Social Worker

## 2021-08-04 DIAGNOSIS — F411 Generalized anxiety disorder: Secondary | ICD-10-CM

## 2021-08-04 NOTE — Progress Notes (Signed)
Counselor/Therapist Progress Note  Patient ID: Felicia Jones, MRN: [id number],    Date: 08/04/2021  Time Spent: 45 minutes   Treatment Type: Psychotherapy  Reported Symptoms:  Anxiety, anxious thoughts  Mental Status Exam:  Appearance:   Casual and Neat     Behavior:  Appropriate and Sharing  Motor:  Normal  Speech/Language:   Clear and Coherent and Normal Rate  Affect:  Appropriate, Congruent, and Full Range  Mood:  normal  Thought process:  normal  Thought content:    WNL  Sensory/Perceptual disturbances:    WNL  Orientation:  oriented to person, place, time/date, and situation  Attention:  Good  Concentration:  Good  Memory:  WNL  Fund of knowledge:   Good  Insight:    Good  Judgment:   Good  Impulse Control:  Good   Risk Assessment: Danger to Self:  No Self-injurious Behavior: No Danger to Others: No Duty to Warn:no Physical Aggression / Violence:No  Access to Firearms a concern: No  Gang Involvement:No   Subjective: Patient was engaged and cooperative throughout the session using time effectively to discuss thoughts and feelings. Patient was receptive to feedback and intervention from LCSW. Patient is likely to benefit from future treatment because they remain motivated to decrease anxiety and reports benefit of regular sessions.      Interventions: Cognitive Behavioral Therapy Checked in with patient regarding her week. Engaged patient in processing problem regarding issues with member of family of origin leading to anxiety. Assisted patient with defusing self from the problem, reframed unhelpful thoughts and created increased psychological flexibility. LCSW and patient problem solved options and discussed effective communication skills. Provided support through active listening, validation of feelings, and highlighted patient's strengths.     Diagnosis:   ICD-10-CM   1. Generalized anxiety disorder  F41.1      Plan: Start with the "Pretzel Pose" mindfulness  exercise   Patient's goal of treatment is patient's goal is to manage her emotions, wants to be able to communicate better with her mom    Treatment Target: Understand the relationship between thoughts, emotions, and behaviors  Psychoeducation on CBT model   Teach the connection between thoughts, emotions, and behaviors  Treatment Target: Increase realistic balanced thinking -to learn how to replace thinking with thoughts that are more accurate or helpful Explore patient's thoughts, beliefs, automatic thoughts, assumptions  Identify and replace unhelpful thinking patterns (upsetting ideas, self-talk and mental images) Process distress and trauma and allow for emotional release  Questioning and challenging thoughts Cognitive reappraisal  Restructuring, Socratic questioning  Treatment Target: Reducing vulnerability to emotional mind Values clarification and goal identification  Self-care - nutrition, sleep, exercise  Increase positive and pleasurable events  Mindfulness practices  Treatment Target: Increase skills Assertiveness communication  Boundary setting   Future Appointments  Date Time Provider Department Center  08/17/2021 11:30 AM Kathreen Cosier, LCSW [facility] None    Kathreen Cosier, LCSW

## 2021-08-17 ENCOUNTER — Ambulatory Visit: Payer: Medicaid Other | Admitting: Licensed Clinical Social Worker

## 2021-08-17 DIAGNOSIS — F411 Generalized anxiety disorder: Secondary | ICD-10-CM

## 2021-08-17 NOTE — Progress Notes (Signed)
Counselor/Therapist Progress Note  Patient ID: Felicia Jones, MRN: [id number],    Date: 08/17/2021  Time Spent: 48 minutes    Treatment Type: Psychotherapy  Reported Symptoms:  Anxiety, anxious thoughts  Mental Status Exam:  Appearance:   NA     Behavior:  Appropriate and Sharing  Motor:  Normal  Speech/Language:   Clear and Coherent and Normal Rate  Affect:  Appropriate, Congruent, and Full Range  Mood:  normal  Thought process:  normal  Thought content:    WNL  Sensory/Perceptual disturbances:    WNL  Orientation:  oriented to person, place, time/date, and situation  Attention:  Good  Concentration:  Good  Memory:  WNL  Fund of knowledge:   Good  Insight:    Good  Judgment:   Good  Impulse Control:  Good   Risk Assessment: Danger to Self:  No Self-injurious Behavior: No Danger to Others: No Duty to Warn:no Physical Aggression / Violence:No  Access to Firearms a concern: No  Gang Involvement:No   Subjective: Patient was receptive to feedback and intervention from LCSW. Patient is likely to benefit from future treatment because they remain motivated to decrease anxiety and improve functioning and reports benefit of regular sessions.        Interventions: Cognitive Behavioral Therapy Checked in with patient regarding their week. LCSW assisted patient in processing their thoughts and emotions related to conflict with family member. LCSW validated patient's feelings of anger and frustration, identifying origins of these feelings. LCSW reviewed ABCD Method and discussed effective communication skills with patient. Provided support through active listening, validation of feelings, and highlighted patient's strengths.    Diagnosis:   ICD-10-CM   1. Generalized anxiety disorder  F41.1       Plan: Start with the "Pretzel Pose" mindfulness exercise   Patient's goal of treatment is patient's goal is to manage her emotions, wants to be able to communicate better with her mom     Treatment Target: Understand the relationship between thoughts, emotions, and behaviors  Psychoeducation on CBT model   Teach the connection between thoughts, emotions, and behaviors  Treatment Target: Increase realistic balanced thinking -to learn how to replace thinking with thoughts that are more accurate or helpful Explore patient's thoughts, beliefs, automatic thoughts, assumptions  Identify and replace unhelpful thinking patterns (upsetting ideas, self-talk and mental images) Process distress and trauma and allow for emotional release  Questioning and challenging thoughts Cognitive reappraisal  Restructuring, Socratic questioning  Treatment Target: Reducing vulnerability to emotional mind Values clarification and goal identification  Self-care - nutrition, sleep, exercise  Increase positive and pleasurable events  Mindfulness practices  Treatment Target: Increase skills Assertiveness communication  Boundary setting   Future Appointments  Date Time Provider Department Center  08/24/2021 11:00 AM Kathreen Cosier, LCSW [facility] None     Kathreen Cosier, [place]

## 2021-08-24 ENCOUNTER — Ambulatory Visit: Payer: Medicaid Other | Admitting: Licensed Clinical Social Worker

## 2021-08-24 DIAGNOSIS — F411 Generalized anxiety disorder: Secondary | ICD-10-CM

## 2021-08-24 NOTE — Progress Notes (Deleted)
Counselor/Therapist Progress Note  Patient ID: Felicia Jones, MRN: [id number],    Date: 08/24/2021  Time Spent: ***   Treatment Type: Psychotherapy  Reported Symptoms: {CHL AMB Reported Symptoms:[phone]}  Mental Status Exam:  Appearance:   {PSY:22683}     Behavior:  {PSY:21022743}  Motor:  {PSY:22302}  Speech/Language:   {PSY:22685}  Affect:  {PSY:22687}  Mood:  {PSY:31886}  Thought process:  {PSY:31888}  Thought content:    {PSY:828-691-2752}  Sensory/Perceptual disturbances:    {PSY:719-015-8625}  Orientation:  {PSY:30297}  Attention:  {PSY:22877}  Concentration:  {[phone]}  Memory:  {[phone]}  Fund of knowledge:   {[phone]}  Insight:    {[phone]}  Judgment:   {[phone]}  Impulse Control:  {[phone]}   Risk Assessment: Danger to Self:  No Self-injurious Behavior: No Danger to Others: No Duty to Warn:no Physical Aggression / Violence:No  Access to Firearms a concern: No  Gang Involvement:No   Subjective: Patient was engaged and cooperative throughout the session using time effectively to discuss    . Patient was receptive to feedback and intervention from LCSW. Patient voices continued motivation for treatment and understanding of  . Patient is likely to benefit from future treatment because they remain motivated to decrease  and   and reports benefit of regular sessions.        Interventions: Cognitive Behavioral Therapy and Mindfulness Meditation Checked in with patient regarding her week. Reviewed previous session regarding  Therapist assisted, actively listened, taught, shared, role/played, provided    Diagnosis:   ICD-10-CM   1. Generalized anxiety disorder  F41.1       Plan: Start with the "Pretzel Pose" mindfulness exercise   Patient's goal of treatment is patient's goal is to manage her emotions, wants to be able to communicate better with her mom    Treatment Target: Understand the relationship between thoughts,  emotions, and behaviors  Psychoeducation on CBT model   Teach the connection between thoughts, emotions, and behaviors  Treatment Target: Increase realistic balanced thinking -to learn how to replace thinking with thoughts that are more accurate or helpful Explore patient's thoughts, beliefs, automatic thoughts, assumptions  Identify and replace unhelpful thinking patterns (upsetting ideas, self-talk and mental images) Process distress and trauma and allow for emotional release  Questioning and challenging thoughts Cognitive reappraisal  Restructuring, Socratic questioning  Treatment Target: Reducing vulnerability to emotional mind Values clarification and goal identification  Self-care - nutrition, sleep, exercise  Increase positive and pleasurable events  Mindfulness practices  Treatment Target: Increase skills Assertiveness communication  Boundary setting   Future Appointments  Date Time Provider Gerty  08/24/2021 11:00 AM Milton Ferguson, LCSW [facility] None      Milton Ferguson, [place]

## 2021-08-24 NOTE — Progress Notes (Signed)
Counselor/Therapist Progress Note  Patient ID: Felicia Jones, MRN: [id number],    Date: 08/24/2021  Time Spent: 40 minutes   Treatment Type: Psychotherapy  Reported Symptoms:  Anxiety, anxious thoughts  Mental Status Exam:  Appearance:   Casual     Behavior:  Appropriate and Sharing  Motor:  Normal  Speech/Language:   Clear and Coherent and Normal Rate  Affect:  Appropriate, Congruent, and Full Range  Mood:  normal  Thought process:  normal  Thought content:    WNL  Sensory/Perceptual disturbances:    WNL  Orientation:  oriented to person, place, time/date, and situation  Attention:  Good  Concentration:  Good  Memory:  WNL  Fund of knowledge:   Good  Insight:    Good  Judgment:   Good  Impulse Control:  Good   Risk Assessment: Danger to Self:  No Self-injurious Behavior: No Danger to Others: No Duty to Warn:no Physical Aggression / Violence:No  Access to Firearms a concern: No  Gang Involvement:No   Subjective: Patient was engaged and cooperative throughout the session using time effectively to discuss thoughts and feelings and progress towards goal of treatment. Patient was receptive to feedback and intervention from LCSW.  Patient is likely to benefit from future treatment because they remain motivated to decrease anxiety and improve functioning and reports benefit of regular sessions in addressing anxiety.        Interventions: Cognitive Behavioral Therapy and Mindfulness Meditation Checked in with patient and engaged patient in "Pretzel Pose" mindfulness exercise, processed exercise and encouraged patient to practice as homework task. Checked in with patient regarding her week. Explored patient's report of progress towards goal of treatment and processed patient's distress related to recent parenting stressor. LCSW validated patient feelings of fear and reframed unhelpful thoughts leading to increased anxiety. Provided support through active listening, validation of  feelings, and highlighted patient's strengths.   Diagnosis:   ICD-10-CM   1. Generalized anxiety disorder  F41.1      Plan: Patient's goal of treatment is patient's goal is to manage her emotions, wants to be able to communicate better with her mom    Treatment Target: Understand the relationship between thoughts, emotions, and behaviors  Psychoeducation on CBT model   Teach the connection between thoughts, emotions, and behaviors  Treatment Target: Increase realistic balanced thinking -to learn how to replace thinking with thoughts that are more accurate or helpful Explore patient's thoughts, beliefs, automatic thoughts, assumptions  Identify and replace unhelpful thinking patterns (upsetting ideas, self-talk and mental images) Process distress and trauma and allow for emotional release  Questioning and challenging thoughts Cognitive reappraisal  Restructuring, Socratic questioning  Treatment Target: Reducing vulnerability to emotional mind Values clarification and goal identification  Self-care - nutrition, sleep, exercise  Increase positive and pleasurable events  Mindfulness practices  Treatment Target: Increase skills Assertiveness communication  Boundary setting   Future Appointments  Date Time Provider Department Center  09/07/2021  1:10 PM Kathreen Cosier, LCSW [facility] None    Kathreen Cosier, LCSW

## 2021-09-07 ENCOUNTER — Ambulatory Visit: Payer: Medicaid Other | Admitting: Licensed Clinical Social Worker

## 2021-09-07 DIAGNOSIS — F411 Generalized anxiety disorder: Secondary | ICD-10-CM

## 2021-09-07 NOTE — Progress Notes (Signed)
Counselor/Therapist Progress Note ? ?Patient ID: Felicia Jones, MRN: [id number],   ? ?Date: 09/07/2021 ? ?Time Spent: 30 minutes  ? ?Treatment Type: Psychotherapy ? ?Reported Symptoms:  mild anxiety, anxious thoughts  ? ?Mental Status Exam: ? ?Appearance:   Neat and Well Groomed     ?Behavior:  Appropriate and Sharing  ?Motor:  Normal  ?Speech/Language:   Clear and Coherent and Normal Rate  ?Affect:  Appropriate, Congruent, and Full Range  ?Mood:  normal  ?Thought process:  normal  ?Thought content:    WNL  ?Sensory/Perceptual disturbances:    WNL  ?Orientation:  oriented to person, place, time/date, and situation  ?Attention:  Good  ?Concentration:  Good  ?Memory:  WNL  ?Fund of knowledge:   Good  ?Insight:    Good  ?Judgment:   Good  ?Impulse Control:  Good  ? ?Risk Assessment: ?Danger to Self:  No ?Self-injurious Behavior: No ?Danger to Others: No ?Duty to Warn:no ?Physical Aggression / Violence:No  ?Access to Firearms a concern: No  ?Gang Involvement:No  ? ?Subjective: Patient was receptive to feedback and intervention from LCSW and actively and effectively participated throughout the session. Patient is likely to benefit from future treatment because she remains motivated to decrease anxiety and reports benefit of regular sessions in addressing these symptoms.     ? ?Interventions: Cognitive Behavioral Therapy and Mindfulness Meditation ?Checked in with patient regarding their week. LCSW assisted patient in processing their thoughts and emotions about what they have experienced in childhood and recent trigger due to encounter with extended family member. Also processed progress related to relationships and increased open communication with mom and brother. Validated patient's feelings of anxiety. LCSW reviewed boundary setting and effective communication skills with patient. Provided support through active listening, validation of feelings, and highlighted patient's strengths.   ? ?Diagnosis: ?  ICD-10-CM   ?1.  Generalized anxiety disorder  F41.1   ?  ? ?Plan: Patient's goal of treatment is patient's goal is to manage her emotions, wants to be able to communicate better with her mom  ?  ?Treatment Target: Understand the relationship between thoughts, emotions, and behaviors  ?Psychoeducation on CBT model   ?Teach the connection between thoughts, emotions, and behaviors  ?Treatment Target: Increase realistic balanced thinking -to learn how to replace thinking with thoughts that are more accurate or helpful ?Explore patient's thoughts, beliefs, automatic thoughts, assumptions  ?Identify and replace unhelpful thinking patterns (upsetting ideas, self-talk and mental images) ?Process distress and trauma and allow for emotional release  ?Questioning and challenging thoughts ?Cognitive reappraisal  ?Restructuring, Socratic questioning  ?Treatment Target: Reducing vulnerability to ?emotional mind? ?Values clarification and goal identification  ?Self-care - nutrition, sleep, exercise  ?Increase positive and pleasurable events  ?Mindfulness practices  ?Treatment Target: Increase skills ?Assertiveness communication  ?Boundary setting  ? ?Future Appointments  ?Date Time Provider Department Center  ?09/14/2021 11:30 AM Kathreen Cosier, LCSW [facility] None  ? ?Kathreen Cosier, LCSW ? ?

## 2021-09-14 ENCOUNTER — Ambulatory Visit: Payer: Medicaid Other | Admitting: Licensed Clinical Social Worker

## 2021-09-21 ENCOUNTER — Ambulatory Visit: Payer: Medicaid Other | Admitting: Licensed Clinical Social Worker

## 2021-09-21 DIAGNOSIS — F411 Generalized anxiety disorder: Secondary | ICD-10-CM

## 2021-09-21 NOTE — Progress Notes (Signed)
Counselor/Therapist Progress Note ? ?Patient ID: Felicia Jones, MRN: [id number],   ? ?Date: 09/21/2021 ? ?Time Spent: 45 minutes   ? ?Treatment Type: Psychotherapy ? ?Reported Symptoms:  overall stable mood; mild anxiety  ? ?Mental Status Exam: ? ?Appearance:   Casual     ?Behavior:  Appropriate and Sharing  ?Motor:  Normal  ?Speech/Language:   Clear and Coherent and Normal Rate  ?Affect:  Appropriate, Congruent, and Full Range  ?Mood:  normal  ?Thought process:  normal  ?Thought content:    WNL  ?Sensory/Perceptual disturbances:    WNL  ?Orientation:  oriented to person, place, time/date, and situation  ?Attention:  Good  ?Concentration:  Good  ?Memory:  WNL  ?Fund of knowledge:   Good  ?Insight:    Good  ?Judgment:   Good  ?Impulse Control:  Good  ? ?Risk Assessment: ?Danger to Self:  No ?Self-injurious Behavior: No ?Danger to Others: No ?Duty to Warn:no ?Physical Aggression / Violence:No  ?Access to Firearms a concern: No  ?Gang Involvement:No  ? ?Subjective: Patient was engaged and cooperative throughout the session using time effectively to discuss thoughts and feelings. Patient voices continued motivation for treatment and understanding of anxiety issues. Patient is likely to benefit from future treatment because she remains motivated to decrease anxiety and improve functioning and reports benefit of regular sessions in addressing these symptoms.  ? ?Interventions: Cognitive Behavioral Therapy and Mindfulness Meditation ?Established psychological safety. Checked in with patient regarding their week. LCSW assisted patient in processing their thoughts and emotions about challenges and progress in relationships. LCSW validated patient's feelings of frustration, and highlighted and reframed unhelpful thoughts, reviewed flexibility in thinking and acceptance. Provided support through active listening, validation of feelings, and highlighted patient's strengths.   ? ?Diagnosis: ?  ICD-10-CM   ?1. Generalized anxiety  disorder  F41.1   ?  ? ?Plan: Patient's goal of treatment is patient's goal is to manage her emotions, wants to be able to communicate better with her mom  ?  ?Treatment Target: Understand the relationship between thoughts, emotions, and behaviors  ?Psychoeducation on CBT model   ?Teach the connection between thoughts, emotions, and behaviors  ?Treatment Target: Increase realistic balanced thinking -to learn how to replace thinking with thoughts that are more accurate or helpful ?Explore patient's thoughts, beliefs, automatic thoughts, assumptions  ?Identify and replace unhelpful thinking patterns (upsetting ideas, self-talk and mental images) ?Process distress and trauma and allow for emotional release  ?Questioning and challenging thoughts ?Cognitive reappraisal  ?Restructuring, Socratic questioning  ?Treatment Target: Reducing vulnerability to ?emotional mind? ?Values clarification and goal identification  ?Self-care - nutrition, sleep, exercise  ?Increase positive and pleasurable events  ?Mindfulness practices  ?Treatment Target: Increase skills ?Assertiveness communication  ?Boundary setting  ? ?Future Appointments  ?Date Time Provider Department Center  ?09/28/2021  1:00 PM Kathreen Cosier, LCSW [facility] None  ? ? ? ?Kathreen Cosier, LCSW ? ?

## 2021-09-28 ENCOUNTER — Ambulatory Visit: Payer: Medicaid Other | Admitting: Licensed Clinical Social Worker

## 2021-09-28 DIAGNOSIS — F411 Generalized anxiety disorder: Secondary | ICD-10-CM

## 2021-09-28 NOTE — Progress Notes (Signed)
Counselor/Therapist Progress Note ? ?Patient ID: Felicia Jones, MRN: [id number],   ? ?Date: 09/28/2021 ? ?Time Spent: 1 hour  ? ?Treatment Type: Psychotherapy ? ?Reported Symptoms:  Mild anxiety, anxious thoughts ? ?Mental Status Exam: ? ?Appearance:   Appropriate and Sharing     ?Behavior:  Appropriate and Sharing  ?Motor:  Normal  ?Speech/Language:   Clear and Coherent and Normal Rate  ?Affect:  Appropriate, Congruent, and Full Range  ?Mood:  normal  ?Thought process:  normal  ?Thought content:    WNL  ?Sensory/Perceptual disturbances:    WNL  ?Orientation:  oriented to person, place, time/date, and situation  ?Attention:  Good  ?Concentration:  Good  ?Memory:  WNL  ?Fund of knowledge:   Good  ?Insight:    Good  ?Judgment:   Good  ?Impulse Control:  Good  ? ?Risk Assessment: ?Danger to Self:  No ?Self-injurious Behavior: No ?Danger to Others: No ?Duty to Warn:no ?Physical Aggression / Violence:No  ?Access to Firearms a concern: No  ?Gang Involvement:No  ? ?Subjective: Patient was engaged and cooperative throughout the session using time effectively to discuss thoughts and feelings. Patient was receptive to feedback and intervention from LCSW. Patient is likely to benefit from future treatment and they remain motivated to decrease anxiety and improve functioning.  ? ?Interventions: Cognitive Behavioral Therapy, Mindfulness Meditation, and Triple P ?Checked in with patient regarding their week. LCSW assisted patient in processing their thoughts and emotions about co-parenting challenges with father of her children. LCSW explored for points of intervention, highlighting unhelpful anxious thoughts and discussing these thoughts; provided psychoeducation on TP; reviewed mindfulness and sent guided meditation for patient to use as homework. Provided support through active listening, validation of feelings, and highlighted patient's strengths.  ? ?Diagnosis: ?  ICD-10-CM   ?1. Generalized anxiety disorder  F41.1   ?   ? ?Plan: Patient's goal of treatment is patient's goal is to manage her emotions, wants to be able to communicate better with her mom  ?  ?Treatment Target: Understand the relationship between thoughts, emotions, and behaviors  ?Psychoeducation on CBT model   ?Teach the connection between thoughts, emotions, and behaviors  ?Treatment Target: Increase realistic balanced thinking -to learn how to replace thinking with thoughts that are more accurate or helpful ?Explore patient's thoughts, beliefs, automatic thoughts, assumptions  ?Identify and replace unhelpful thinking patterns (upsetting ideas, self-talk and mental images) ?Process distress and trauma and allow for emotional release  ?Questioning and challenging thoughts ?Cognitive reappraisal  ?Restructuring, Socratic questioning  ?Treatment Target: Reducing vulnerability to ?emotional mind? ?Values clarification and goal identification  ?Self-care - nutrition, sleep, exercise  ?Increase positive and pleasurable events  ?Mindfulness practices  ?Treatment Target: Increase skills ?Assertiveness communication  ?Boundary setting  ? ?Future Appointments  ?Date Time Provider Department Center  ?10/05/2021 10:30 AM Kathreen Cosier, LCSW [facility] None  ? ? ?Kathreen Cosier, LCSW ? ?

## 2021-09-29 IMAGING — US US ABDOMEN LIMITED RUQ/ASCITES
2 series · 14 of 25 positions shown · non-contrast
Comparison: None.

CLINICAL DATA: Right upper quadrant pain

EXAM:
ULTRASOUND ABDOMEN LIMITED RIGHT UPPER QUADRANT

[Series 1: us abdomen limited ruq (liver/gb) · 13 of 118 slices shown (1 of 2)]
[im 1/118]
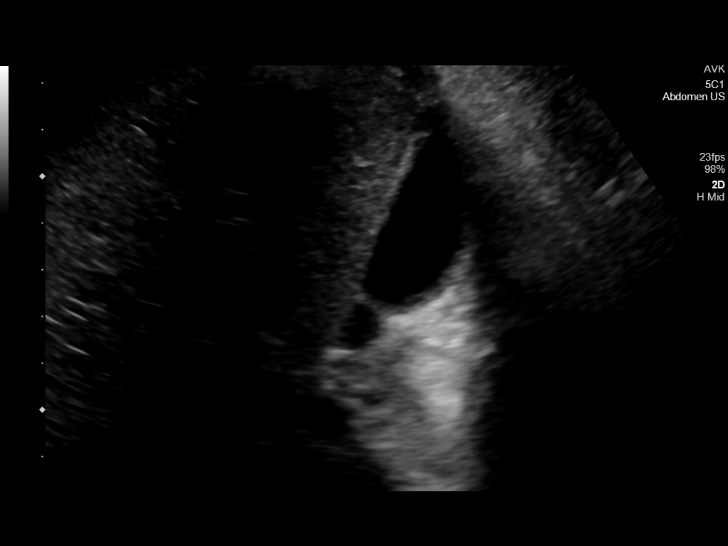
[im 11/118]
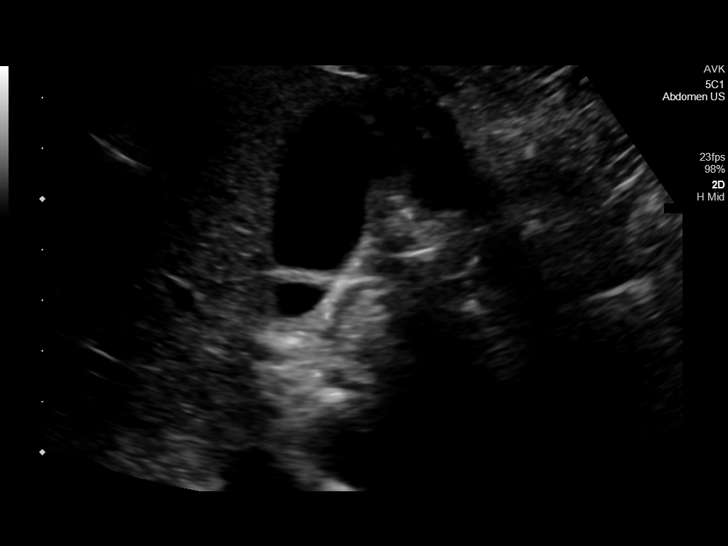
[im 21/118]
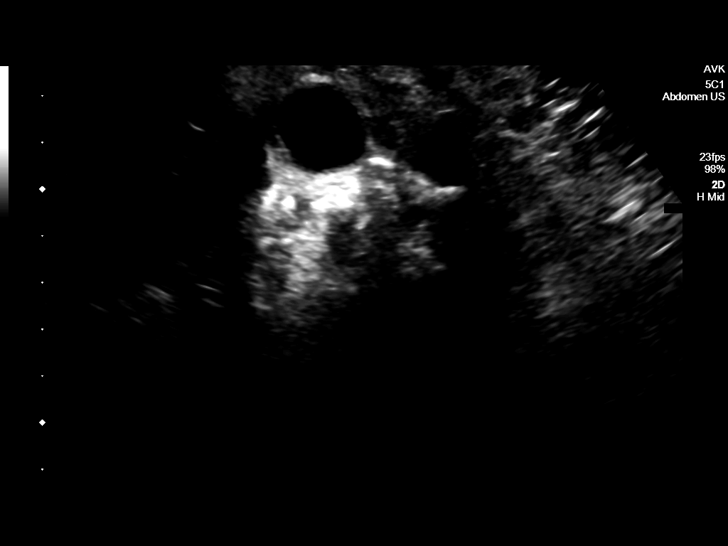
[im 31/118]
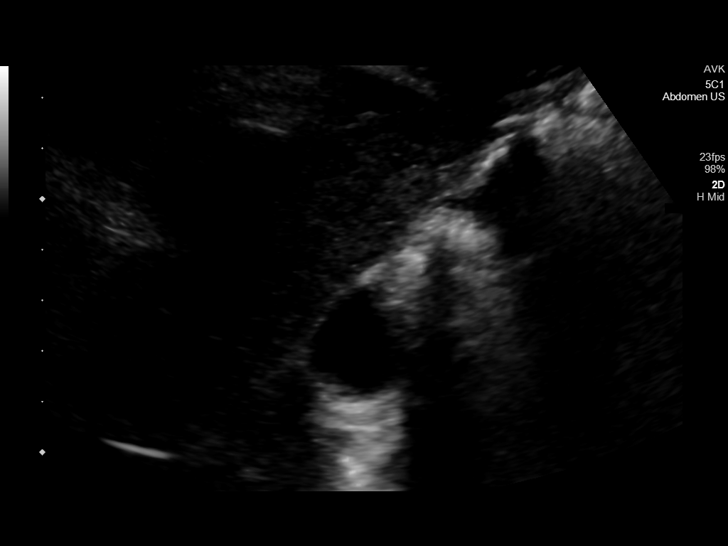
[im 41/118]
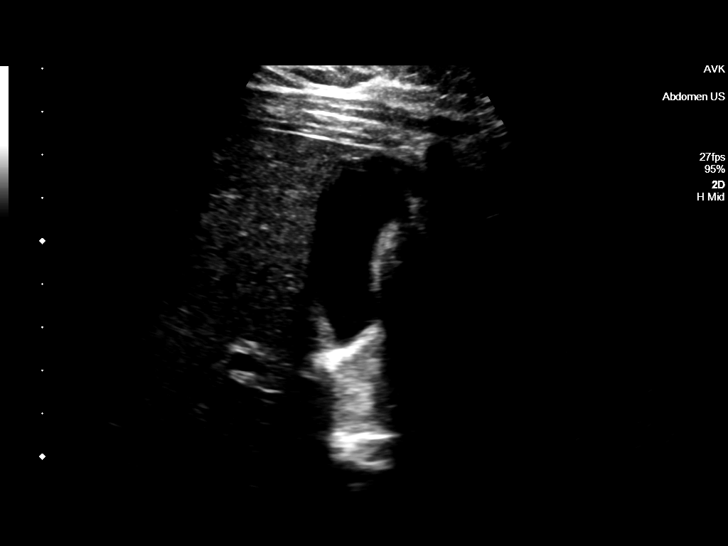
[im 46/118]
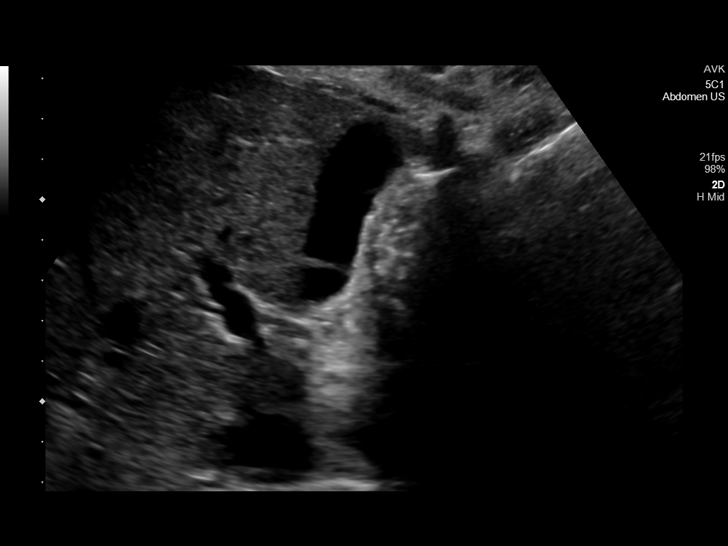
[im 56/118]
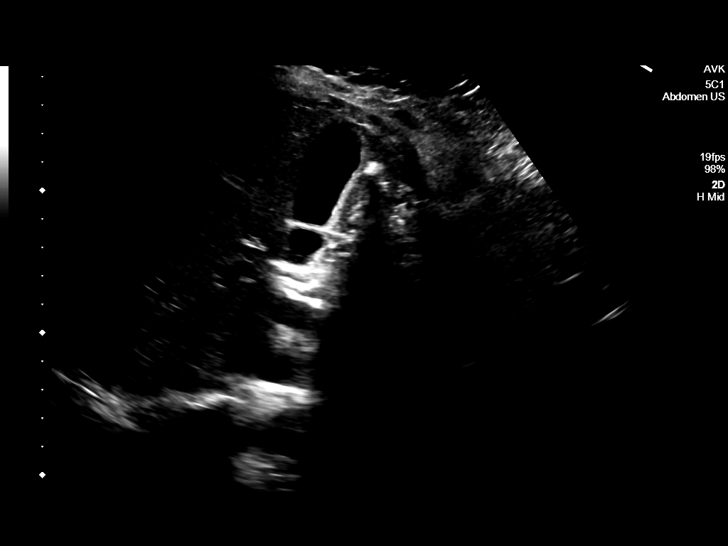
[im 67/118]
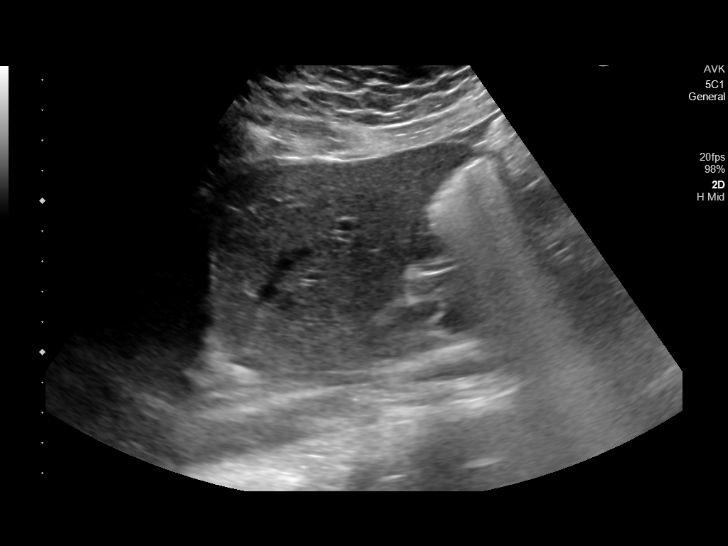
[im 77/118]
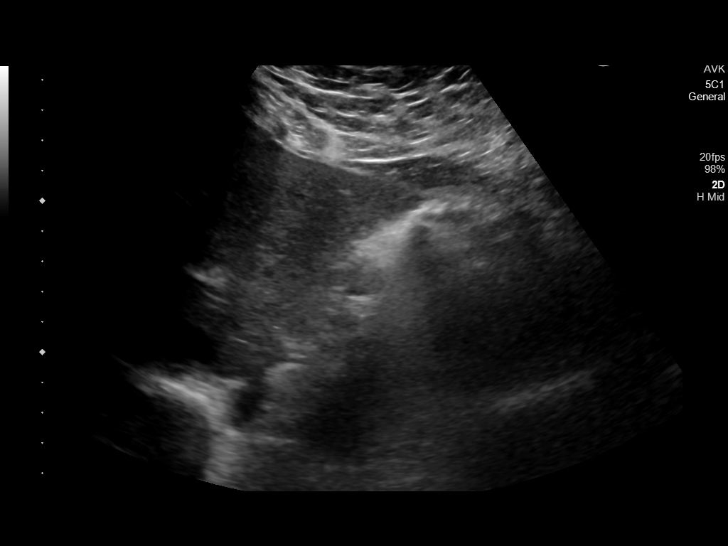
[im 82/118]
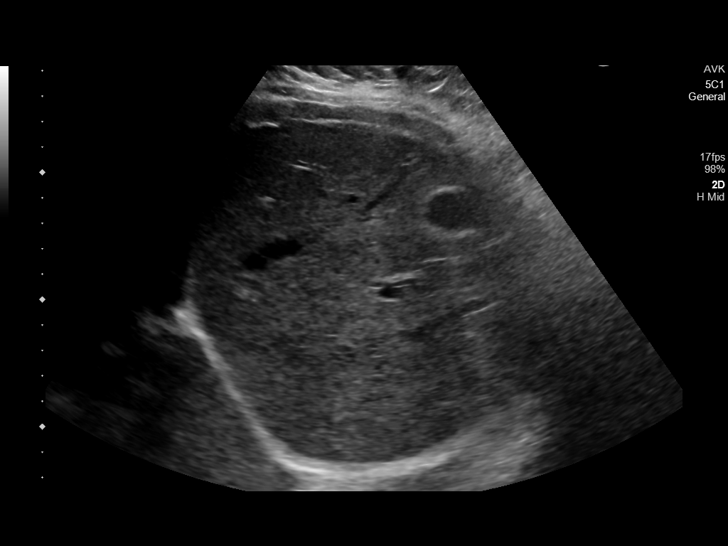
[im 92/118]
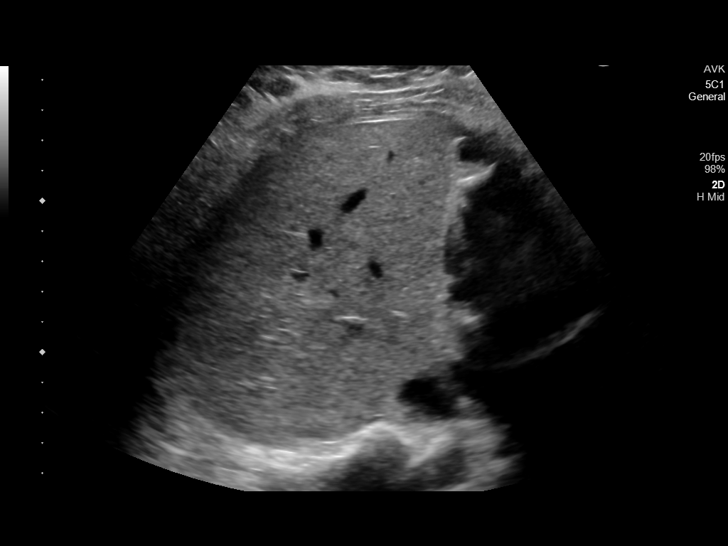
[im 102/118]
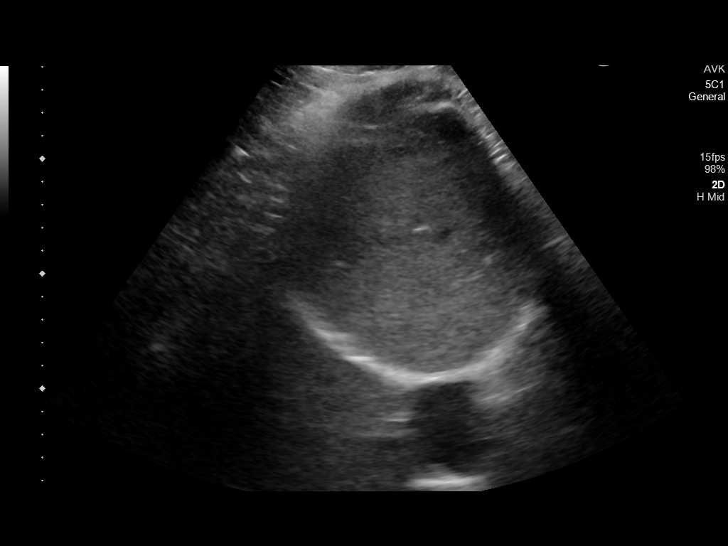
[im 112/118]
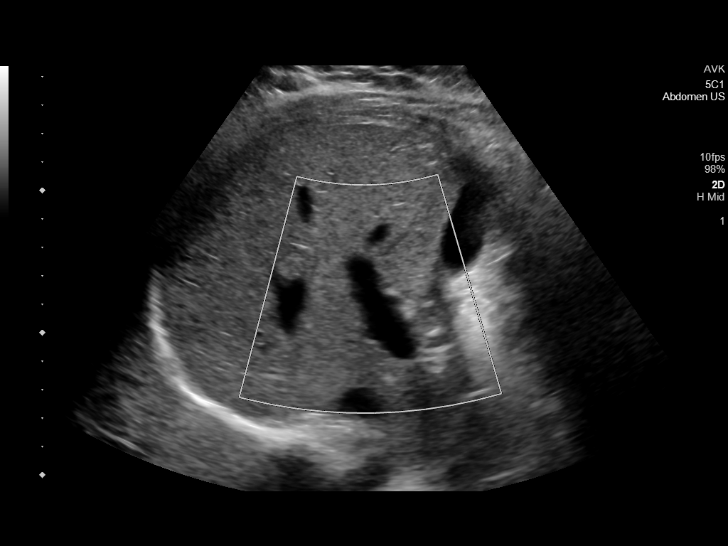

[Series 1001: us abdomen limited ruq (liver/gb) · 1 of 2 slices shown (2 of 2)]
[im 1/2]
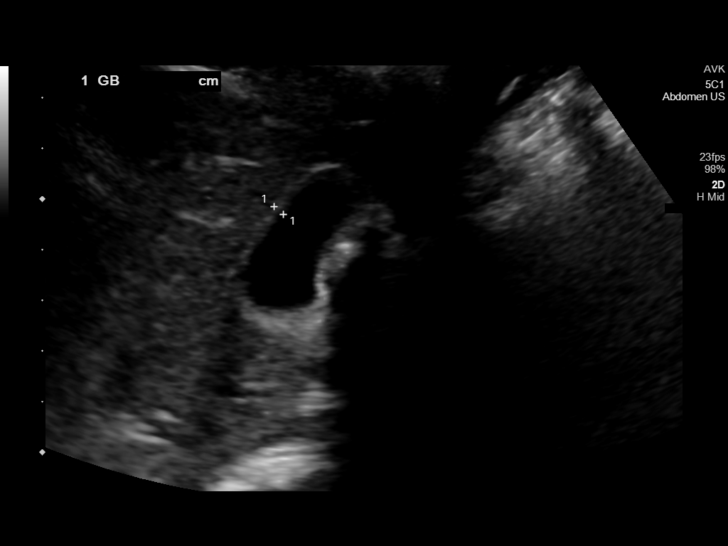

[14 of 25 positions shown; findings below may reference images not displayed]

FINDINGS: Gallbladder:

No gallstones or wall thickening visualized. No sonographic Murphy
sign noted by sonographer.

Common bile duct:

Diameter: Normal caliber, 3 mm

Liver:

No focal lesion identified. Within normal limits in parenchymal
echogenicity. Portal vein is patent on color Doppler imaging with
normal direction of blood flow towards the liver.

Other: None.
IMPRESSION: Negative.

## 2021-10-05 ENCOUNTER — Ambulatory Visit: Payer: Medicaid Other | Admitting: Licensed Clinical Social Worker

## 2021-10-05 DIAGNOSIS — F411 Generalized anxiety disorder: Secondary | ICD-10-CM

## 2021-10-05 NOTE — Progress Notes (Signed)
Counselor/Therapist Progress Note ? ?Patient ID: Felicia Jones, MRN: [id number],   ? ?Date: 10/05/2021 ? ?Time Spent: 30 minutes -patient arrived late   ? ?Treatment Type: Psychotherapy ? ?Reported Symptoms:  Anxiety, anxious thoughts, irritability  ? ?Mental Status Exam: ? ?Appearance:   Casual and Well Groomed     ?Behavior:  Appropriate and Sharing  ?Motor:  Normal  ?Speech/Language:   Clear and Coherent and Normal Rate  ?Affect:  Appropriate, Congruent, and Full Range  ?Mood:  normal  ?Thought process:  normal  ?Thought content:    WNL  ?Sensory/Perceptual disturbances:    WNL  ?Orientation:  oriented to person, place, time/date, and situation  ?Attention:  Good  ?Concentration:  Good  ?Memory:  WNL  ?Fund of knowledge:   Good  ?Insight:    Good  ?Judgment:   Good  ?Impulse Control:  Good  ? ?Risk Assessment: ?Danger to Self:  No ?Self-injurious Behavior: No ?Danger to Others: No ?Duty to Warn:no ?Physical Aggression / Violence:No  ?Access to Firearms a concern: No  ?Gang Involvement:No  ? ?Subjective: Patient was receptive to feedback and intervention from LCSW and actively and effectively participated throughout the session. Patient is likely to benefit from future treatment because she remains motivated to decrease anxiety and reports benefit of regular sessions in addressing these symptoms.     ? ?Interventions: Cognitive Behavioral Therapy ?Checked in with patient regarding their week. Explored patient's challenges with not being where she wants to be in life and how anxiety and other barriers are impacting her in moving forward. Assisted patient in problem solving options to achieve/work towards her goals and values, provided psychoeducation on Core Beliefs and encouraged her to complete exercise as homework. Provided support through active listening, validation of feelings, and highlighted patient's strengths.   ? ?Diagnosis: ?  ICD-10-CM   ?1. Generalized anxiety disorder  F41.1   ?  ? ?Plan: Patient's goal  of treatment is patient's goal is to manage her emotions, wants to be able to communicate better with her mom  ?  ?Treatment Target: Understand the relationship between thoughts, emotions, and behaviors  ?Psychoeducation on CBT model   ?Teach the connection between thoughts, emotions, and behaviors  ?Treatment Target: Increase realistic balanced thinking -to learn how to replace thinking with thoughts that are more accurate or helpful ?Explore patient's thoughts, beliefs, automatic thoughts, assumptions  ?Identify and replace unhelpful thinking patterns (upsetting ideas, self-talk and mental images) ?Process distress and trauma and allow for emotional release  ?Questioning and challenging thoughts ?Cognitive reappraisal  ?Restructuring, Socratic questioning  ?Core beliefs  ?Treatment Target: Reducing vulnerability to ?emotional mind? ?Values clarification and goal identification  ?Self-care - nutrition, sleep, exercise  ?Increase positive and pleasurable events  ?Mindfulness practices  ?Treatment Target: Increase skills ?Assertiveness communication  ?Boundary setting  ? ?Future Appointments  ?Date Time Provider Department Center  ?10/12/2021 11:30 AM Kathreen Cosier, LCSW [facility] None  ? ? ?Kathreen Cosier, LCSW ? ?

## 2021-10-09 ENCOUNTER — Encounter: Payer: Self-pay | Admitting: Obstetrics and Gynecology

## 2021-10-11 ENCOUNTER — Other Ambulatory Visit: Payer: Self-pay | Admitting: Obstetrics

## 2021-10-11 DIAGNOSIS — Z30016 Encounter for initial prescription of transdermal patch hormonal contraceptive device: Secondary | ICD-10-CM

## 2021-10-11 MED ORDER — NORELGESTROMIN-ETH ESTRADIOL 150-35 MCG/24HR TD PTWK: 1.0000 | MEDICATED_PATCH | TRANSDERMAL | 12 refills | Status: DC

## 2021-10-12 ENCOUNTER — Ambulatory Visit: Payer: Medicaid Other | Admitting: Licensed Clinical Social Worker

## 2021-10-12 DIAGNOSIS — F411 Generalized anxiety disorder: Secondary | ICD-10-CM

## 2021-10-12 NOTE — Progress Notes (Signed)
Counselor/Therapist Progress Note ? ?Patient ID: Felicia Jones, MRN: [id number],   ? ?Date: 10/12/2021 ? ?Time Spent: 40 minutes   ? ?Treatment Type: Psychotherapy ? ?Reported Symptoms:  Mild anxiety ? ?Mental Status Exam: ? ?Appearance:   Casual and Well Groomed     ?Behavior:  Appropriate and Sharing  ?Motor:  Normal  ?Speech/Language:   Clear and Coherent and Normal Rate  ?Affect:  Appropriate, Congruent, and Full Range  ?Mood:  normal  ?Thought process:  goal directed  ?Thought content:    WNL  ?Sensory/Perceptual disturbances:    WNL  ?Orientation:  oriented to person, place, time/date, and situation  ?Attention:  Good  ?Concentration:  Good  ?Memory:  WNL  ?Fund of knowledge:   Good  ?Insight:    Good  ?Judgment:   Good  ?Impulse Control:  Good  ? ?Risk Assessment: ?Danger to Self:  No ?Self-injurious Behavior: No ?Danger to Others: No ?Duty to Warn:no ?Physical Aggression / Violence:No  ?Access to Firearms a concern: No  ?Gang Involvement:No  ? ?Subjective: Patient was receptive to feedback and intervention from LCSW and actively and effectively participated throughout the session.    ? ?Interventions: Cognitive Behavioral Therapy ?Checked in with patient regarding their week. LCSW assisted patient in processing their thoughts and emotions regarding recent experiences with co-parenting and in regard to progress towards goals of treatment. LCSW provided psychoeducation on Mindfulness and shared mindfulness video with patient. Provided support through active listening, validation of feelings, and highlighted patient's strengths.   ? ?Diagnosis: ?  ICD-10-CM   ?1. Generalized anxiety disorder  F41.1   ?  ? ?Plan: Patient's goal of treatment is patient's goal is to manage her emotions, wants to be able to communicate better with her mom  ?  ?Treatment Target: Understand the relationship between thoughts, emotions, and behaviors  ?Psychoeducation on CBT model   ?Teach the connection between thoughts, emotions, and  behaviors  ?Treatment Target: Increase realistic balanced thinking -to learn how to replace thinking with thoughts that are more accurate or helpful ?Explore patient's thoughts, beliefs, automatic thoughts, assumptions  ?Identify and replace unhelpful thinking patterns (upsetting ideas, self-talk and mental images) ?Process distress and trauma and allow for emotional release  ?Questioning and challenging thoughts ?Cognitive reappraisal  ?Restructuring, Socratic questioning  ?Core beliefs  ?Treatment Target: Reducing vulnerability to ?emotional mind? ?Values clarification and goal identification  ?Self-care - nutrition, sleep, exercise  ?Increase positive and pleasurable events  ?Mindfulness practices  ?Treatment Target: Increase skills ?Assertiveness communication  ?Boundary setting  ? ?Future Appointments  ?Date Time Provider Department Center  ?10/26/2021 11:10 AM Kathreen Cosier, LCSW [facility] None  ? ? ?Kathreen Cosier, LCSW ? ?

## 2021-10-26 ENCOUNTER — Ambulatory Visit: Payer: Medicaid Other | Admitting: Licensed Clinical Social Worker

## 2021-10-26 NOTE — Progress Notes (Unsigned)
Counselor/Therapist Progress Note ? ?Patient ID: Felicia Jones, MRN: [id number],   ? ?Date: 10/26/2021 ? ?Time Spent: ***  ? ?Treatment Type: Psychotherapy ? ?Reported Symptoms: {CHL AMB Reported Symptoms:[id number]} ? ?Mental Status Exam: ? ?Appearance:   {PSY:22683}     ?Behavior:  {PSY:21022743}  ?Motor:  {PSY:22302}  ?Speech/Language:   {PSY:22685}  ?Affect:  {PSY:22687}  ?Mood:  {PSY:31886}  ?Thought process:  {PSY:31888}  ?Thought content:    {PSY:2283665975}  ?Sensory/Perceptual disturbances:    {PSY:480-059-6671}  ?Orientation:  {PSY:30297}  ?Attention:  {PSY:22877}  ?Concentration:  {PSY:([phone] [phone]}  ?Memory:  {[phone]}  ?Fund of knowledge:   {PSY:([phone]}  ?Insight:    {PSY:([phone] 095-8112}  ?Judgment:   {PSY:([phone] [phone]}  ?Impulse Control:  {PSY:([phone]}  ? ?Risk Assessment: ?Danger to Self:  No ?Self-injurious Behavior: No ?Danger to Others: No ?Duty to Warn:no ?Physical Aggression / Violence:No  ?Access to Firearms a concern: No  ?Gang Involvement:No  ? ?Subjective: Patient was engaged and cooperative throughout the session using time effectively to discuss    . Patient was receptive to feedback and intervention from LCSW. Patient voices continued motivation for treatment and understanding of  . Patient is likely to benefit from future treatment because they remain motivated to decrease  and   and reports benefit of regular sessions.       ? ?Interventions: Cognitive Behavioral Therapy and Mindfulness Meditation ? ?Diagnosis:No diagnosis found. ? ?Plan: Patient's goal of treatment is patient's goal is to manage her emotions, wants to be able to communicate better with her mom  ?  ?Treatment Target: Understand the relationship between thoughts, emotions, and behaviors  ?Psychoeducation on CBT model   ?Teach the connection between thoughts, emotions, and behaviors  ?Treatment Target: Increase realistic balanced thinking -to learn how to replace thinking with thoughts that are more accurate or  helpful ?Explore patient's thoughts, beliefs, automatic thoughts, assumptions  ?Identify and replace unhelpful thinking patterns (upsetting ideas, self-talk and mental images) ?Process distress and trauma and allow for emotional release  ?Questioning and challenging thoughts ?Cognitive reappraisal  ?Restructuring, Socratic questioning  ?Core beliefs  ?Treatment Target: Reducing vulnerability to ?emotional mind? ?Values clarification and goal identification  ?Self-care - nutrition, sleep, exercise  ?Increase positive and pleasurable events  ?Mindfulness practices  ?Treatment Target: Increase skills ?Assertiveness communication  ?Boundary setting  ? ?Future Appointments  ?Date Time Provider Department Center  ?10/26/2021 11:10 AM Kathreen Cosier, LCSW [facility] None  ? ? ? ?Kathreen Cosier, LCSW ? ?

## 2021-11-23 ENCOUNTER — Ambulatory Visit: Payer: Medicaid Other | Admitting: Licensed Clinical Social Worker

## 2021-11-23 DIAGNOSIS — F411 Generalized anxiety disorder: Secondary | ICD-10-CM

## 2021-11-23 NOTE — Progress Notes (Signed)
Counselor/Therapist Progress Note  Patient ID: Kechia Yahnke, MRN: [id number],    Date: 11/23/2021  Time Spent: 50 minutes   Treatment Type: Psychotherapy  Reported Symptoms:  Continued mood stability; improvement in anxiety symptoms   Mental Status Exam:  Appearance:   Casual, Neat, and Well Groomed     Behavior:  Appropriate, Sharing, and Motivated  Motor:  Normal  Speech/Language:   Clear and Coherent and Normal Rate  Affect:  Appropriate, Congruent, and Full Range  Mood:  normal  Thought process:  normal  Thought content:    WNL  Sensory/Perceptual disturbances:    WNL  Orientation:  oriented to person, place, time/date, and situation  Attention:  Good  Concentration:  Good  Memory:  WNL  Fund of knowledge:   Good  Insight:    Good  Judgment:   Good  Impulse Control:  Good   Risk Assessment: Danger to Self:  No Self-injurious Behavior: No Danger to Others: No Duty to Warn:no Physical Aggression / Violence:No  Access to Firearms a concern: No  Gang Involvement:No   Subjective: Patient was receptive to feedback and intervention from LCSW and actively and effectively participated throughout the session. Patient is likely to benefit from future treatment because she remains motivated to continue to manage symptoms and reports benefit of sessions in addressing symptoms.      Interventions: Cognitive Behavioral Therapy, Person Centered  Checked in with patient regarding their week. LCSW assisted patient in processing their thoughts and emotions about the improvement made in symptoms and functioning and what they have experienced in relationship with father of children in in other relationships. LCSW reviewed patient's values and effective communication skills with patient. Provided support through active listening, validation of feelings, and highlighted patient's strengths.    Diagnosis:   ICD-10-CM   1. Generalized anxiety disorder  F41.1      Plan:  Patient's goal of  treatment is patient's goal is to manage her emotions, wants to be able to communicate better with her mom    Treatment Target: Understand the relationship between thoughts, emotions, and behaviors  Psychoeducation on CBT model   Teach the connection between thoughts, emotions, and behaviors  Treatment Target: Increase realistic balanced thinking -to learn how to replace thinking with thoughts that are more accurate or helpful Explore patient's thoughts, beliefs, automatic thoughts, assumptions  Identify and replace unhelpful thinking patterns (upsetting ideas, self-talk and mental images) Process distress and trauma and allow for emotional release  Questioning and challenging thoughts Cognitive reappraisal  Restructuring, Socratic questioning  Core beliefs  Treatment Target: Reducing vulnerability to "emotional mind" Values clarification and goal identification  Self-care - nutrition, sleep, exercise  Increase positive and pleasurable events  Mindfulness practices  Treatment Target: Increase skills Assertiveness communication  Boundary setting   Future Appointments  Date Time Provider Department Center  12/21/2021  1:10 PM Kathreen Cosier, LCSW [facility] None    Kathreen Cosier, LCSW

## 2021-12-20 IMAGING — US US OB COMP +14 WK
1 series · 13 of 28 positions shown · non-contrast
Comparison: none

CLINICAL DATA: Evaluate fetal growth and amniotic fluid.

EXAM:
OBSTETRICAL ULTRASOUND >14 WKS

[Series 1: us ob comp +14 wk · 0.22mm/px · 13 of 82 slices shown]
[im 4/82]
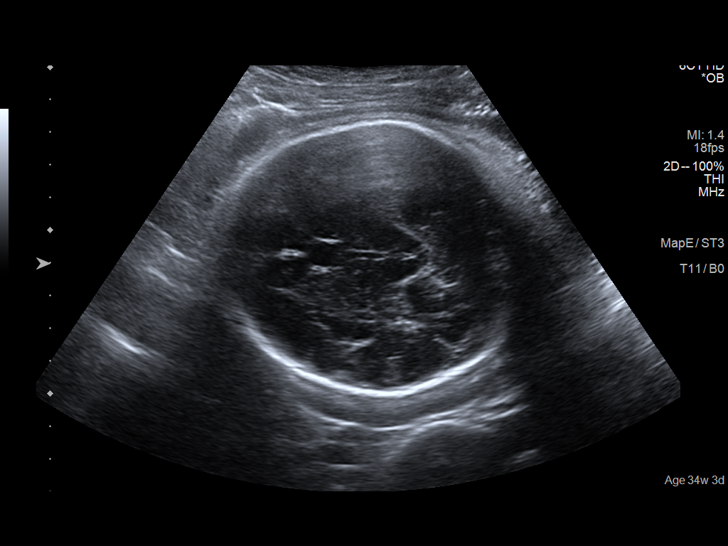
[im 10/82]
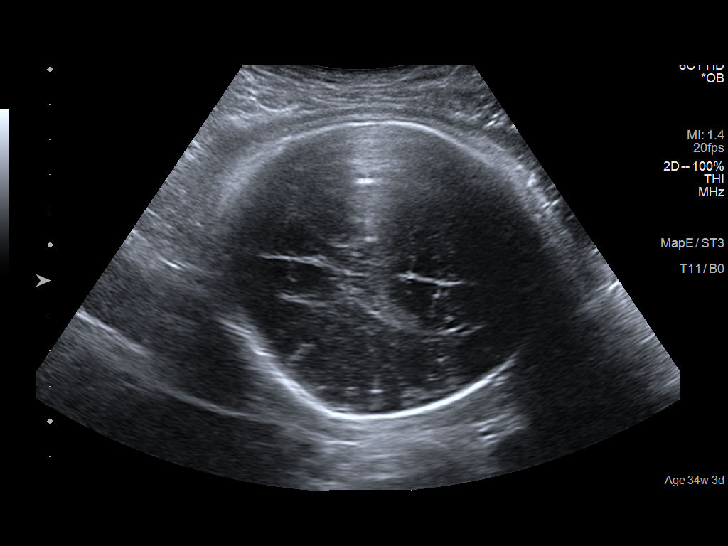
[im 16/82]
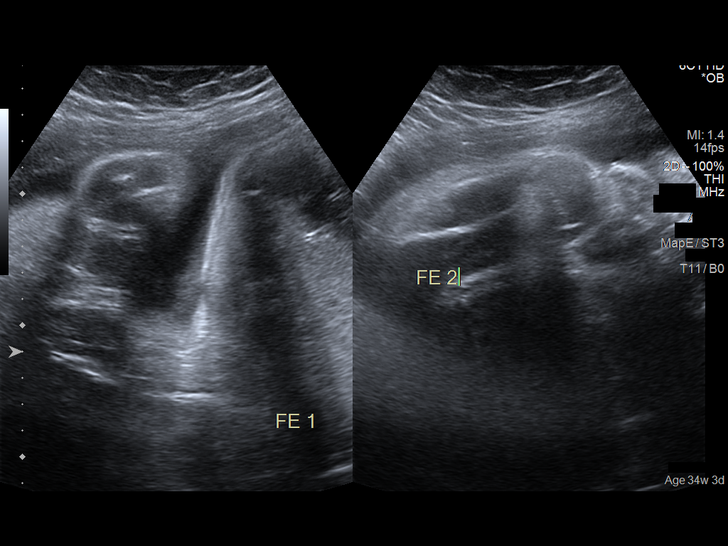
[im 22/82]
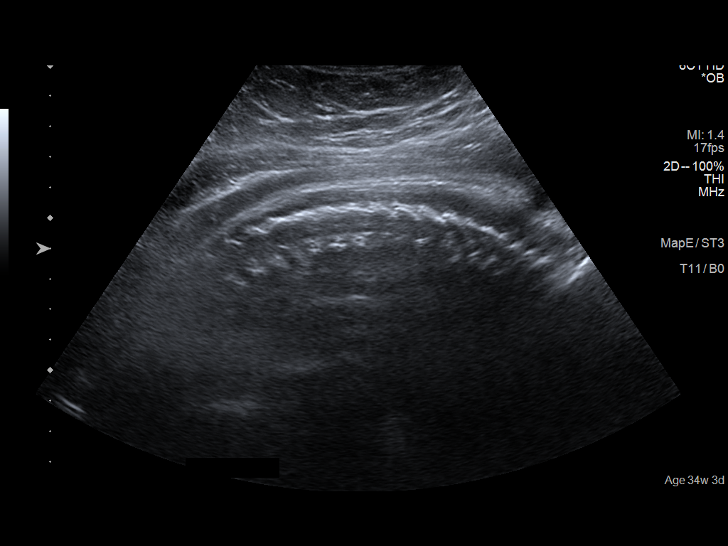
[im 28/82]
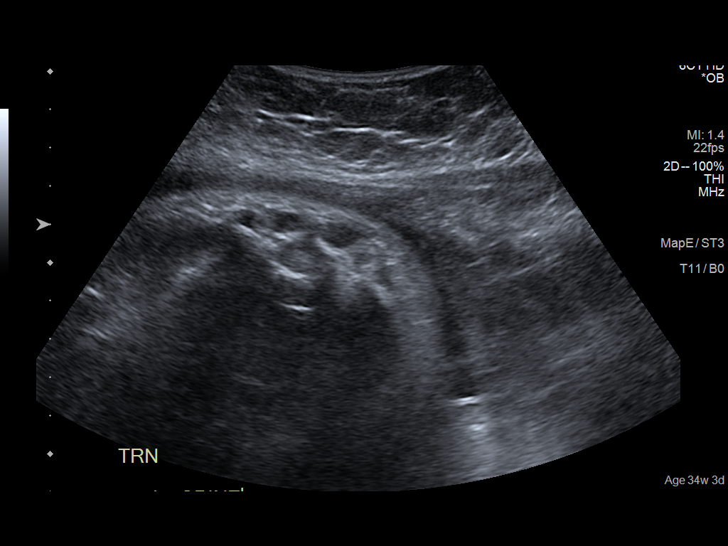
[im 34/82]
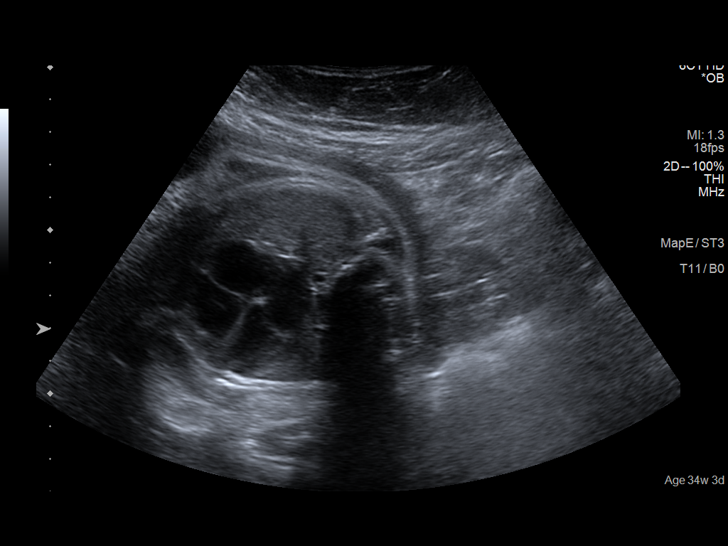
[im 43/82]
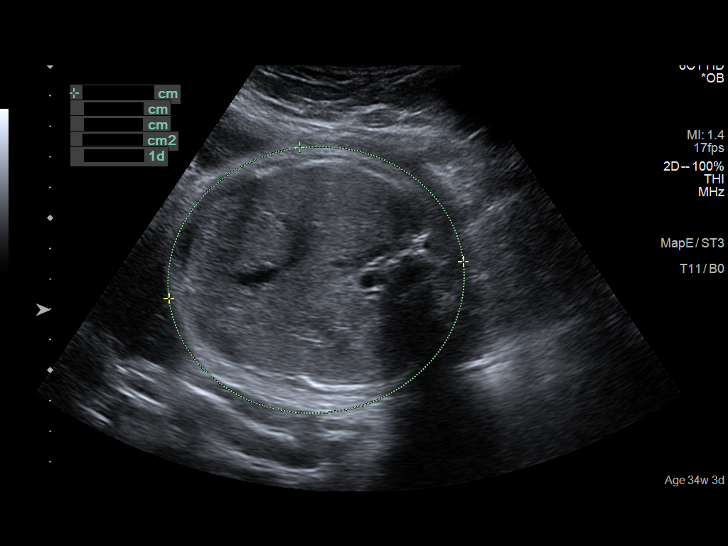
[im 49/82]
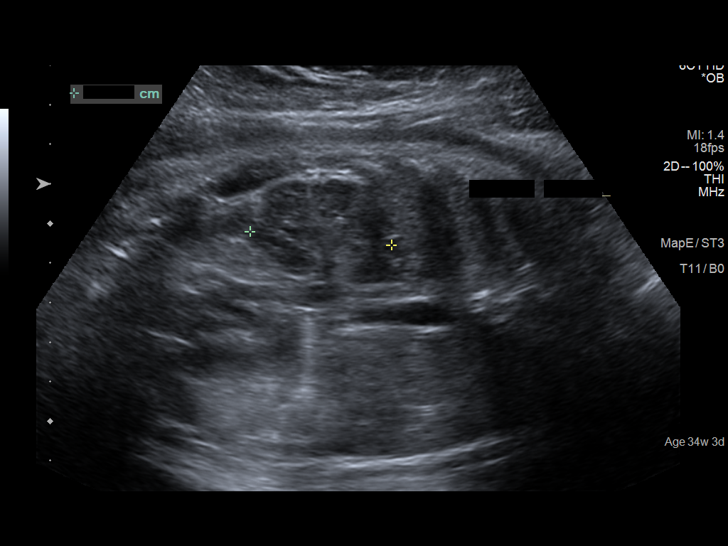
[im 55/82]
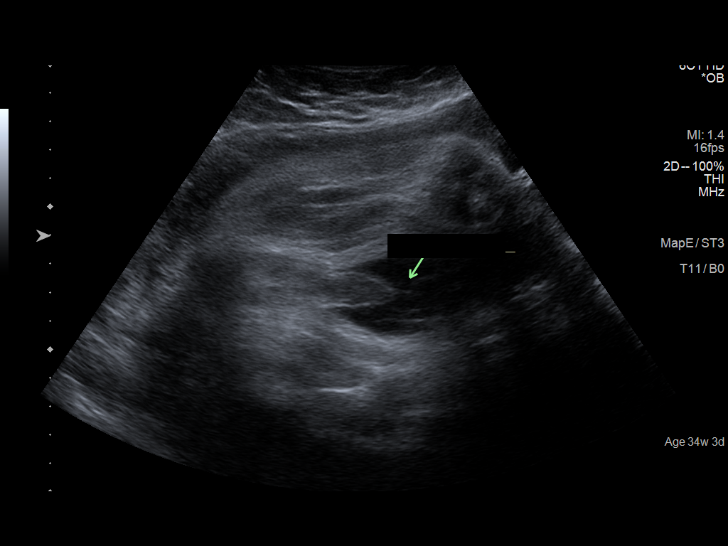
[im 61/82]
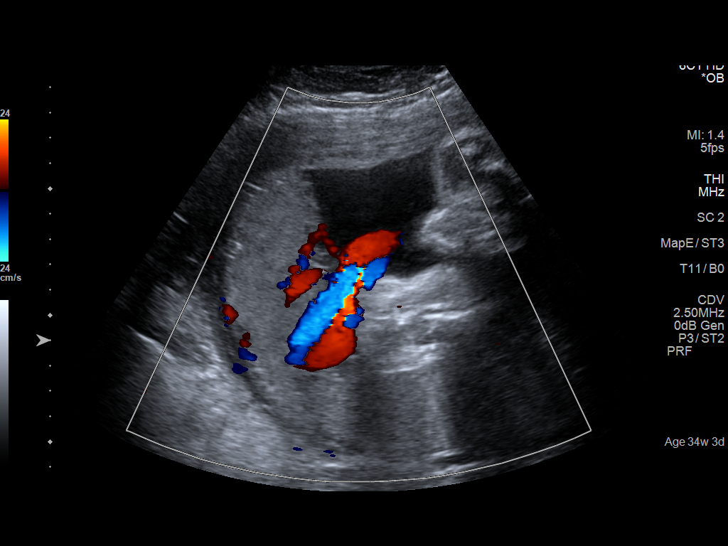
[im 67/82]
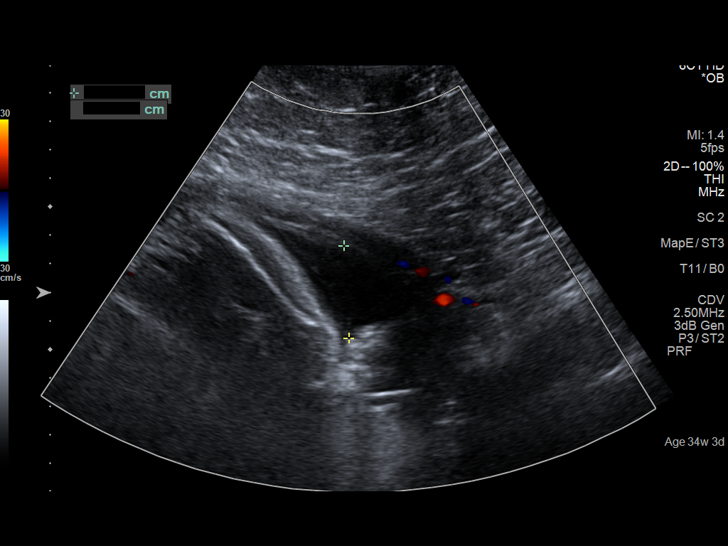
[im 73/82]
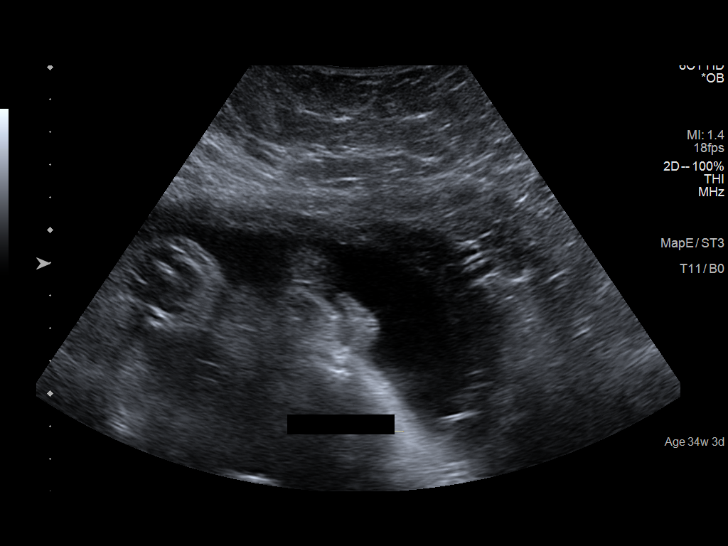
[im 79/82]
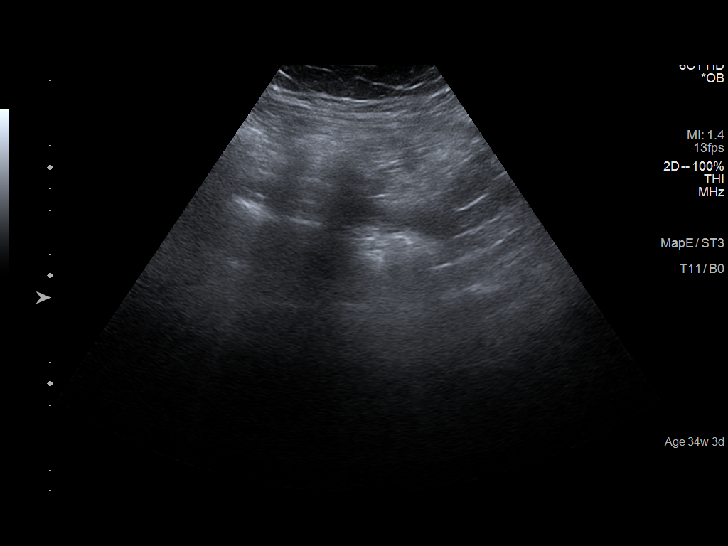

[13 of 28 positions shown; findings below may reference images not displayed]

FINDINGS: Number of Fetuses: 1

Heart Rate:  140 bpm

Movement: Yes

Presentation: Cephalic

Previa: No

Placental Location: Fundal and posterior

Amniotic Fluid (Subjective): Within normal limits

Amniotic Fluid (Objective):

Vertical pocket = cm

AFI = 11.0 cm (5%ile= 8.1 cm, 95%= 24.8 cm for 34 wks)

FETAL BIOMETRY

BPD: 8.5cm 34w 1d

HC:   30.3cm 33w 4d

AC:   29.3cm 33w 2d

FL:   6.6cm 33w 6d

Current Mean GA: 33w 5d US EDC: 02/05/2021

Assigned GA:  34w 3d Assigned EDC: 01/31/2021

Estimated Fetal Weight:  2,221g 21%ile

FETAL ANATOMY

Lateral Ventricles: Appears normal

Thalami/CSP: Appears normal

Posterior Fossa:  Appears normal

Nuchal Region: Not visualized   NFT= N/A > 20 WKS

Upper Lip: Appears normal

Spine: Appears normal

4 Chamber Heart on Left: Appears normal

LVOT: Appears normal

RVOT: Appears normal

Stomach on Left: Appears normal

3 Vessel Cord: Appears normal

Cord Insertion site: Not visualized

Kidneys: Appears normal

Bladder: Appears normal

Extremities: Appears normal

Sex: Male

Technically difficult due to: Advanced gestational age and fetal
position

Maternal Findings:

Cervix:  3.4 cm TA
IMPRESSION: Assigned GA currently 34 weeks 3 days.  EFW currently at 21 %ile.

Amniotic fluid volume within normal limits, with AFI of 11.0 cm.

Unremarkable visualized fetal anatomy.

## 2021-12-21 ENCOUNTER — Ambulatory Visit: Payer: Self-pay | Admitting: Licensed Clinical Social Worker

## 2021-12-21 DIAGNOSIS — F411 Generalized anxiety disorder: Secondary | ICD-10-CM

## 2021-12-21 NOTE — Progress Notes (Signed)
Counselor/Therapist Progress Note  Patient ID: Felicia Jones, MRN: [id number],    Date: 12/21/2021  Time Spent: 40 minutes (patient arrived late)    Treatment Type: Psychotherapy  Reported Symptoms:  Mild anxiety, anxious thoughts  Mental Status Exam:  Appearance:   Neat and Well Groomed     Behavior:  Appropriate and Sharing  Motor:  Normal  Speech/Language:   Normal Rate  Affect:  Appropriate, Congruent, and Full Range  Mood:  euthymic  Thought process:  normal  Thought content:    WNL  Sensory/Perceptual disturbances:    WNL  Orientation:  oriented to person, place, time/date, situation, and day of week  Attention:  Good  Concentration:  Good  Memory:  WNL  Fund of knowledge:   Good  Insight:    Good  Judgment:   Good  Impulse Control:  Good   Risk Assessment: Danger to Self:  No Self-injurious Behavior: No Danger to Others: No Duty to Warn:no Physical Aggression / Violence:No  Access to Firearms a concern: No  Gang Involvement:No   Subjective: Patient was receptive to feedback and intervention from LCSW and actively and effectively participated throughout the session. Patient is likely to benefit from future treatment because she remains motivated to continue to decrease anxiety and reports benefit of regular sessions in addressing symptoms.      Interventions: Cognitive Behavioral Therapy and Mindfulness Meditation Checked in with patient regarding her week. Engaged patient in processing current psychosocial stressors, continued anxious thoughts due to challenges in relationships with family and intimate partner relationship.  LCSW reviewed unhelpful thought patterns, noticing skills and acceptance of emotions and thoughts with patient. Provided support through active listening, validation of feelings, and highlighted patient's progress in treatment and strengths.    Diagnosis:   ICD-10-CM   1. Generalized anxiety disorder  F41.1       Plan: Patient's goal of  treatment is patient's goal is to manage her emotions, wants to be able to communicate better with her mom    Treatment Target: Understand the relationship between thoughts, emotions, and behaviors  Psychoeducation on CBT model   Teach the connection between thoughts, emotions, and behaviors  Treatment Target: Increase realistic balanced thinking -to learn how to replace thinking with thoughts that are more accurate or helpful Explore patient's thoughts, beliefs, automatic thoughts, assumptions  Identify and replace unhelpful thinking patterns (upsetting ideas, self-talk and mental images) Process distress and trauma and allow for emotional release  Questioning and challenging thoughts Cognitive reappraisal  Restructuring, Socratic questioning  Core beliefs  Treatment Target: Reducing vulnerability to "emotional mind" Values clarification and goal identification  Self-care - nutrition, sleep, exercise  Increase positive and pleasurable events  Mindfulness and Acceptance practices  Treatment Target: Increase skills Assertiveness communication  Boundary setting   Future Appointments  Date Time Provider Department Center  01/11/2022 11:00 AM Kathreen Cosier, LCSW [facility] None    Kathreen Cosier, LCSW

## 2022-01-11 ENCOUNTER — Ambulatory Visit: Payer: Medicaid Other | Admitting: Licensed Clinical Social Worker

## 2022-01-18 ENCOUNTER — Ambulatory Visit: Payer: Medicaid Other | Admitting: Licensed Clinical Social Worker

## 2022-02-15 ENCOUNTER — Ambulatory Visit: Payer: Medicaid Other | Admitting: Licensed Clinical Social Worker

## 2022-02-15 DIAGNOSIS — F411 Generalized anxiety disorder: Secondary | ICD-10-CM

## 2022-02-15 NOTE — Progress Notes (Signed)
Counselor/Therapist Progress Note  Patient ID: Felicia Jones, MRN: [id number],    Date: 02/15/2022  Time Spent: 45 minutes   Treatment Type: Psychotherapy  Reported Symptoms:  Mild anxiety, anxious thoughts, irritability, frustration   Mental Status Exam:  Appearance:   Neat and Well Groomed     Behavior:  Appropriate and Sharing  Motor:  Normal  Speech/Language:   Clear and Coherent and Normal Rate  Affect:  Appropriate, Congruent, and Full Range  Mood:  normal  Thought process:  normal  Thought content:    WNL  Sensory/Perceptual disturbances:    WNL  Orientation:  oriented to person, place, time/date, and situation  Attention:  Good  Concentration:  Good  Memory:  WNL  Fund of knowledge:   Good  Insight:    Good  Judgment:   Good  Impulse Control:  Good   Risk Assessment: Danger to Self:  No Self-injurious Behavior: No Danger to Others: No Duty to Warn:no Physical Aggression / Violence:No  Access to Firearms a concern: No  Gang Involvement:No   Subjective: Patient was engaged and cooperative throughout the session using time effectively to discuss thoughts and feelings. Patient voices continued improvement in overall mood and functioning. Patient is likely to benefit from future treatment because she remains motivated to continue to manage symptoms and improve functioning  and reports benefit of regular sessions.      Interventions: Cognitive Behavioral Therapy Established psychological safety. Checked in with patient regarding current symptoms and psychosocial stressors and set session agenda. LCSW explored patient's thoughts related to challenges in relationships with father of her children and with her cousin, identifying points of intervention. LCSW highlighted unhelpful thoughts and reframed thoughts leading to distress, assisted patient in identifying values related to these relationships, encouraged patient to journal her challenges related to father of her  children, and reviewed and practiced effective communication. Provided support through active listening, validation of feelings, and highlighted patient's strengths.    Diagnosis:   ICD-10-CM   1. Generalized anxiety disorder  F41.1      Plan: Patient's goal of treatment is patient's goal is to manage her emotions, wants to be able to communicate better with her mom    Treatment Target: Understand the relationship between thoughts, emotions, and behaviors  Psychoeducation on CBT model   Teach the connection between thoughts, emotions, and behaviors  Treatment Target: Increase realistic balanced thinking -to learn how to replace thinking with thoughts that are more accurate or helpful Explore patient's thoughts, beliefs, automatic thoughts, assumptions  Identify and replace unhelpful thinking patterns (upsetting ideas, self-talk and mental images) Process distress and trauma and allow for emotional release  Questioning and challenging thoughts Cognitive reappraisal  Restructuring, Socratic questioning  Core beliefs  Treatment Target: Reducing vulnerability to "emotional mind" Values clarification and goal identification  Self-care - nutrition, sleep, exercise  Increase positive and pleasurable events  Mindfulness and Acceptance practices  Treatment Target: Increase skills Assertiveness communication  Boundary setting   Future Appointments  Date Time Provider Department Center  03/08/2022  1:00 PM Kathreen Cosier, LCSW [facility] None     Kathreen Cosier, LCSW

## 2022-03-08 ENCOUNTER — Ambulatory Visit: Payer: Medicaid Other | Admitting: Licensed Clinical Social Worker

## 2022-03-08 NOTE — Progress Notes (Unsigned)
Counselor/Therapist Progress Note  Patient ID: Felicia Jones, MRN: [id number],    Date: 03/08/2022  Time Spent: ***   Treatment Type: Psychotherapy  Reported Symptoms: {CHL AMB Reported Symptoms:[id number]}  Mental Status Exam:  Appearance:   {PSY:22683}     Behavior:  {PSY:21022743}  Motor:  {PSY:22302}  Speech/Language:   {PSY:22685}  Affect:  {PSY:22687}  Mood:  {PSY:31886}  Thought process:  {PSY:31888}  Thought content:    {[phone] [phone]}  Sensory/Perceptual disturbances:    {PSY:304-750-0675}  Orientation:  {PSY:30297}  Attention:  {PSY:22877}  Concentration:  {PSY:662-521-2578}  Memory:  {PSY:206 600 0951}  Fund of knowledge:   {PSY:662-521-2578}  Insight:    {PSY:662-521-2578}  Judgment:   {PSY:662-521-2578}  Impulse Control:  {PSY:662-521-2578}   Risk Assessment: Danger to Self:  No Self-injurious Behavior: No Danger to Others: No Duty to Warn:no Physical Aggression / Violence:No  Access to Firearms a concern: No  Gang Involvement:No   Subjective: Patient was engaged and cooperative throughout the session using time effectively to discuss    . Patient was receptive to feedback and intervention from LCSW. Patient voices continued motivation for treatment and understanding of  . Patient is likely to benefit from future treatment because they remain motivated to decrease  and   and reports benefit of regular sessions.        Interventions: Cognitive Behavioral Therapy Checked in with patient regarding their week. LCSW assisted patient in processing their emotions about what they have experienced in ... and ... with / at .... LCSW reviewed behavior modification, distress tolerance and effective communication skills with patient. Provided support through active listening, validation of feelings, and highlighted patient's strengths.     Diagnosis:   ICD-10-CM   1. Generalized anxiety disorder  F41.1       Plan: Patient's goal of treatment is patient's goal is to manage her  emotions, wants to be able to communicate better with her mom    Treatment Target: Understand the relationship between thoughts, emotions, and behaviors  Psychoeducation on CBT model   Teach the connection between thoughts, emotions, and behaviors  Treatment Target: Increase realistic balanced thinking -to learn how to replace thinking with thoughts that are more accurate or helpful Explore patient's thoughts, beliefs, automatic thoughts, assumptions  Identify and replace unhelpful thinking patterns (upsetting ideas, self-talk and mental images) Process distress and trauma and allow for emotional release  Questioning and challenging thoughts Cognitive reappraisal  Restructuring, Socratic questioning  Core beliefs  Treatment Target: Reducing vulnerability to "emotional mind" Values clarification and goal identification  Self-care - nutrition, sleep, exercise  Increase positive and pleasurable events  Mindfulness and Acceptance practices  Treatment Target: Increase skills Assertiveness communication  Boundary setting   Future Appointments  Date Time Provider Department Center  03/08/2022  1:00 PM Kathreen Cosier, LCSW [facility] None    Kathreen Cosier, LCSW

## 2022-03-15 ENCOUNTER — Ambulatory Visit: Payer: Medicaid Other | Admitting: Licensed Clinical Social Worker

## 2022-03-15 DIAGNOSIS — F411 Generalized anxiety disorder: Secondary | ICD-10-CM

## 2022-03-15 NOTE — Progress Notes (Signed)
Counselor/Therapist Progress Note  Patient ID: Felicia Jones, MRN: [id number],    Date: 03/15/2022  Time Spent: 50 minutes    Treatment Type: Psychotherapy  Reported Symptoms:  Mild anxiety   Mental Status Exam:  Appearance:   Well Groomed     Behavior:  Clear and Coherent and Normal Rate  Motor:  Normal  Speech/Language:   Clear and Coherent and Normal Rate  Affect:  Appropriate, Congruent, and Full Range  Mood:  normal  Thought process:  normal  Thought content:    WNL  Sensory/Perceptual disturbances:    WNL  Orientation:  oriented to person, place, time/date, situation, and day of week  Attention:  Good  Concentration:  Good  Memory:  WNL  Fund of knowledge:   Good  Insight:    Good  Judgment:   Good  Impulse Control:  Good   Risk Assessment: Danger to Self:  No Self-injurious Behavior: No Danger to Others: No Duty to Warn:no Physical Aggression / Violence:No  Access to Firearms a concern: No  Gang Involvement:No   Subjective: Patient was engaged and cooperative throughout the session using time effectively to discuss thoughts and feelings. Patient reports increased stressors, but reports managing them well. Patient voices continued motivation for treatment and understanding of anxiety issues. Patient is likely to benefit from future treatment because she remains motivated to manage symptoms and improve functioning and reports benefit of regular sessions.   Interventions: Cognitive Behavioral Therapy Checked in with patient regarding their week. LCSW assisted patient in processing their emotions about what they have experienced with family of origin. LCSW highlighted unhelpful thoughts and challenged and reframed thoughts leading to increased anxiety and irritable mood. Provided support through active listening, validation of feelings, and highlighted patient's strengths.    Diagnosis:   ICD-10-CM   1. Generalized anxiety disorder  F41.1      Plan:  Patient's goal  of treatment is patient's goal is to manage her emotions, wants to be able to communicate better with her mom    Treatment Target: Understand the relationship between thoughts, emotions, and behaviors  Psychoeducation on CBT model   Teach the connection between thoughts, emotions, and behaviors  Treatment Target: Increase realistic balanced thinking -to learn how to replace thinking with thoughts that are more accurate or helpful Explore patient's thoughts, beliefs, automatic thoughts, assumptions  Identify and replace unhelpful thinking patterns (upsetting ideas, self-talk and mental images) Process distress and trauma and allow for emotional release  Questioning and challenging thoughts Cognitive reappraisal  Restructuring, Socratic questioning  Core beliefs  Treatment Target: Reducing vulnerability to "emotional mind" Values clarification and goal identification  Self-care - nutrition, sleep, exercise  Increase positive and pleasurable events  Mindfulness and Acceptance practices  Treatment Target: Increase skills Assertiveness communication  Boundary setting   Future Appointments  Date Time Provider Department Center  03/29/2022 11:30 AM Kathreen Cosier, LCSW [facility] None    Kathreen Cosier, LCSW

## 2022-03-29 ENCOUNTER — Ambulatory Visit: Payer: Medicaid Other | Admitting: Licensed Clinical Social Worker

## 2022-03-29 DIAGNOSIS — F411 Generalized anxiety disorder: Secondary | ICD-10-CM

## 2022-03-29 NOTE — Progress Notes (Signed)
Counselor/Therapist Progress Note  Patient ID: Felicia Jones, MRN: [id number],    Date: 03/29/2022  Time Spent: 47 minutes   Treatment Type: Psychotherapy  Reported Symptoms:  Mild anxiety, anxious thoughts   Mental Status Exam:  Appearance:   Casual, Neat, and Well Groomed     Behavior:  Appropriate and Sharing  Motor:  Normal  Speech/Language:   Clear and Coherent and Normal Rate  Affect:  Appropriate, Congruent, and Full Range  Mood:  normal  Thought process:  normal  Thought content:    WNL  Sensory/Perceptual disturbances:    WNL  Orientation:  oriented to person, place, time/date, and situation  Attention:  Good  Concentration:  Good  Memory:  WNL  Fund of knowledge:   Good  Insight:    Good  Judgment:   Good  Impulse Control:  Good   Risk Assessment: Danger to Self:  No Self-injurious Behavior: No Danger to Others: No Duty to Warn:no Physical Aggression / Violence:No  Access to Firearms a concern: No  Gang Involvement:No   Subjective: Patient was engaged and cooperative throughout the session using time effectively to discuss thoughts and feelings. Patient voices continued motivation for treatment and understanding of anxiety issues. Patient is likely to benefit from future treatment because she remains motivated to decrease anxiety and reports benefit of regular sessions in addressing her symptoms.     Interventions: Cognitive Behavioral Therapy Established psychological safety. Checked in with patient regarding their week. LCSW assisted patient in processing their thoughts and emotions about challenges in relationships with family members. Discussed values, boundaries, thoughts vs emotions and communication. Provided support through active listening, validation of feelings, and highlighted patient's strengths.    Diagnosis:   ICD-10-CM   1. Generalized anxiety disorder  F41.1      Plan:  Patient's goal of treatment is patient's goal is to manage her emotions,  wants to be able to communicate better with her mom    Treatment Target: Understand the relationship between thoughts, emotions, and behaviors  Psychoeducation on CBT model   Teach the connection between thoughts, emotions, and behaviors  Treatment Target: Increase realistic balanced thinking -to learn how to replace thinking with thoughts that are more accurate or helpful Explore patient's thoughts, beliefs, automatic thoughts, assumptions  Identify and replace unhelpful thinking patterns (upsetting ideas, self-talk and mental images) Process distress and trauma and allow for emotional release  Questioning and challenging thoughts Cognitive reappraisal  Restructuring, Socratic questioning  Core beliefs  Treatment Target: Reducing vulnerability to "emotional mind" Values clarification and goal identification  Self-care - nutrition, sleep, exercise  Increase positive and pleasurable events  Mindfulness and Acceptance practices  Treatment Target: Increase skills Assertiveness communication  Boundary setting   Future Appointments  Date Time Provider [place]  04/12/2022 11:00 AM Milton Ferguson, LCSW [facility] None    Milton Ferguson, LCSW

## 2022-04-12 ENCOUNTER — Ambulatory Visit: Payer: Medicaid Other | Admitting: Licensed Clinical Social Worker

## 2022-04-12 DIAGNOSIS — F411 Generalized anxiety disorder: Secondary | ICD-10-CM

## 2022-04-12 NOTE — Progress Notes (Signed)
Counselor/Therapist Progress Note  Patient ID: Felicia Jones, MRN: [id number],    Date: 04/12/2022  Time Spent: 35 minutes    Treatment Type: Psychotherapy  Reported Symptoms:  Overall stable mood, mild anxiety and irritability   Mental Status Exam:  Appearance:   NA     Behavior:  Appropriate, Sharing, Motivated, and Assertive  Motor:  NA  Speech/Language:   Clear and Coherent and Normal Rate  Affect:  NA  Mood:  normal  Thought process:  normal  Thought content:    WNL  Sensory/Perceptual disturbances:    WNL  Orientation:  oriented to person, place, time/date, and situation  Attention:  Good  Concentration:  Good  Memory:  WNL  Fund of knowledge:   Good  Insight:    Fair  Judgment:   Good  Impulse Control:  Good   Risk Assessment: Danger to Self:  No Self-injurious Behavior: No Danger to Others: No Duty to Warn:no Physical Aggression / Violence:No  Access to Firearms a concern: No  Gang Involvement:No   Subjective: Patient was engaged and cooperative throughout the session using time effectively to discuss thoughts and feelings. Patient was receptive to feedback and intervention from LCSW. Patient is likely to benefit from future treatment because they remain motivated to decrease symptoms and reports benefit of regular sessions.        Interventions: Cognitive Behavioral Therapy and Client Centered  Established psychological safety. Checked in with client about her week. Provided supportive space for patient to process thoughts and feelings related to stressors with family, balancing work and personal needs/self-care. LCSW explored patient's irritability, reviewed values with patient and explored ways for patient to balance her needs/values. Provided support through active listening, validation of feelings, and highlighted patient's strengths.   Diagnosis:   ICD-10-CM   1. Generalized anxiety disorder  F41.1      Plan: Patient's goal of treatment is patient's goal  is to manage her emotions, wants to be able to communicate better with her mom    Treatment Target: Understand the relationship between thoughts, emotions, and behaviors  Psychoeducation on CBT model   Teach the connection between thoughts, emotions, and behaviors  Treatment Target: Increase realistic balanced thinking -to learn how to replace thinking with thoughts that are more accurate or helpful Explore patient's thoughts, beliefs, automatic thoughts, assumptions  Identify and replace unhelpful thinking patterns (upsetting ideas, self-talk and mental images) Process distress and trauma and allow for emotional release  Questioning and challenging thoughts Cognitive reappraisal  Restructuring, Socratic questioning  Core beliefs  Treatment Target: Reducing vulnerability to "emotional mind" Values clarification and goal identification  Self-care - nutrition, sleep, exercise  Increase positive and pleasurable events  Mindfulness and Acceptance practices  Treatment Target: Increase skills Assertiveness communication  Boundary setting   Future Appointments  Date Time Provider [place]  04/26/2022  1:00 PM Milton Ferguson, LCSW [facility] None    Milton Ferguson, LCSW

## 2022-04-26 ENCOUNTER — Ambulatory Visit: Payer: Medicaid Other | Admitting: Licensed Clinical Social Worker

## 2022-04-26 NOTE — Progress Notes (Unsigned)
Counselor/Therapist Progress Note  Patient ID: Felicia Jones, MRN: [id number],    Date: 04/26/2022  Time Spent: ***   Treatment Type: Psychotherapy  Reported Symptoms: {CHL AMB Reported Symptoms:([phone]}  Mental Status Exam:  Appearance:   {PSY:22683}     Behavior:  {PSY:21022743}  Motor:  {PSY:22302}  Speech/Language:   {PSY:22685}  Affect:  {PSY:22687}  Mood:  {PSY:31886}  Thought process:  {PSY:31888}  Thought content:    {PSY:250-436-1165}  Sensory/Perceptual disturbances:    {PSY:567-130-6099}  Orientation:  {PSY:30297}  Attention:  {PSY:22877}  Concentration:  {PSY:571-786-6092}  Memory:  {PSY:[phone]}  Fund of knowledge:   {PSY:571-786-6092}  Insight:    {PSY:571-786-6092}  Judgment:   {PSY:571-786-6092}  Impulse Control:  {PSY:571-786-6092}   Risk Assessment: Danger to Self:  No Self-injurious Behavior: No Danger to Others: No Duty to Warn:no Physical Aggression / Violence:No  Access to Firearms a concern: No  Gang Involvement:No   Subjective: Patient was engaged and cooperative throughout the session using time effectively to discuss    . Patient was receptive to feedback and intervention from LCSW. Patient voices continued motivation for treatment and understanding of  . Patient is likely to benefit from future treatment because they remain motivated to decrease  and   and reports benefit of regular sessions.        Interventions: Cognitive Behavioral Therapy  Diagnosis:   ICD-10-CM   1. Generalized anxiety disorder  F41.1       Plan: Patient's goal of treatment is patient's goal is to manage her emotions, wants to be able to communicate better with her mom    Treatment Target: Understand the relationship between thoughts, emotions, and behaviors  Psychoeducation on CBT model   Teach the connection between thoughts, emotions, and behaviors  Treatment Target: Increase realistic balanced thinking -to learn how to replace thinking with thoughts that are more  accurate or helpful Explore patient's thoughts, beliefs, automatic thoughts, assumptions  Identify and replace unhelpful thinking patterns (upsetting ideas, self-talk and mental images) Process distress and trauma and allow for emotional release  Questioning and challenging thoughts Cognitive reappraisal  Restructuring, Socratic questioning  Core beliefs  Treatment Target: Reducing vulnerability to "emotional mind" Values clarification and goal identification  Self-care - nutrition, sleep, exercise  Increase positive and pleasurable events  Mindfulness and Acceptance practices  Treatment Target: Increase skills Assertiveness communication  Boundary setting   Future Appointments  Date Time Provider [place]  04/26/2022  1:00 PM Milton Ferguson, LCSW [facility] None   Milton Ferguson, LCSW

## 2022-05-03 ENCOUNTER — Ambulatory Visit: Payer: Medicaid Other | Admitting: Licensed Clinical Social Worker

## 2022-05-03 NOTE — Progress Notes (Unsigned)
Counselor/Therapist Progress Note  Patient ID: Felicia Jones, MRN: [id number],    Date: 05/03/2022  Time Spent: ***   Treatment Type: Psychotherapy  Reported Symptoms: {CHL AMB Reported Symptoms:[phone]}  Mental Status Exam:  Appearance:   {PSY:22683}     Behavior:  {PSY:21022743}  Motor:  {PSY:22302}  Speech/Language:   {PSY:22685}  Affect:  {PSY:22687}  Mood:  {PSY:31886}  Thought process:  {PSY:31888}  Thought content:    {PSY:639-888-5149}  Sensory/Perceptual disturbances:    {PSY:743-005-3852}  Orientation:  {PSY:30297}  Attention:  {PSY:22877}  Concentration:  {[phone]}  Memory:  {PSY:9707008012}  Fund of knowledge:   {[phone]}  Insight:    {[phone]}  Judgment:   {[phone]}  Impulse Control:  {[phone]}   Risk Assessment: Danger to Self:  No Self-injurious Behavior: No Danger to Others: No Duty to Warn:no Physical Aggression / Violence:No  Access to Firearms a concern: No  Gang Involvement:No   Subjective: Patient was engaged and cooperative throughout the session using time effectively to discuss   Patient voices continued motivation for treatment and understanding of  . Patient is likely to benefit from future treatment because  remains motivated to decrease  And   and reports benefit of regular sessions in addressing these symptoms.   Interventions: Cognitive Behavioral Therapy and Client Centered  Checked in with patient regarding their week. LCSW assisted patient in processing their emotions about what they have experienced in ... and ... with / at .... LCSW reviewed behavior modification, distress tolerance and effective communication skills with patient. Provided support through active listening, validation of feelings, and highlighted patient's strengths.    Diagnosis:   ICD-10-CM   1. Generalized anxiety disorder  F41.1      Plan: Patient's goal of treatment is patient's goal is to manage her emotions, wants to be able  to communicate better with her mom    Treatment Target: Understand the relationship between thoughts, emotions, and behaviors  Psychoeducation on CBT model   Teach the connection between thoughts, emotions, and behaviors  Treatment Target: Increase realistic balanced thinking -to learn how to replace thinking with thoughts that are more accurate or helpful Explore patient's thoughts, beliefs, automatic thoughts, assumptions  Identify and replace unhelpful thinking patterns (upsetting ideas, self-talk and mental images) Process distress and trauma and allow for emotional release  Questioning and challenging thoughts Cognitive reappraisal  Restructuring, Socratic questioning  Core beliefs  Treatment Target: Reducing vulnerability to "emotional mind" Values clarification and goal identification  Self-care - nutrition, sleep, exercise  Increase positive and pleasurable events  Mindfulness and Acceptance practices  Treatment Target: Increase skills Assertiveness communication  Boundary setting   Future Appointments  Date Time Provider [place]  05/03/2022  1:00 PM Milton Ferguson, LCSW [facility] None    Milton Ferguson, LCSW

## 2022-05-30 DIAGNOSIS — A6009 Herpesviral infection of other urogenital tract: Secondary | ICD-10-CM | POA: Insufficient documentation

## 2022-05-31 ENCOUNTER — Ambulatory Visit: Payer: Medicaid Other | Admitting: Licensed Clinical Social Worker

## 2022-05-31 NOTE — Progress Notes (Unsigned)
Counselor/Therapist Progress Note  Patient ID: Felicia Jones, MRN: [id number],    Date: 05/31/2022  Time Spent: ***   Treatment Type: Psychotherapy  Reported Symptoms: {CHL AMB Reported Symptoms:[phone]}  Mental Status Exam:  Appearance:   {PSY:22683}     Behavior:  {PSY:21022743}  Motor:  {PSY:22302}  Speech/Language:   {PSY:22685}  Affect:  {PSY:22687}  Mood:  {PSY:31886}  Thought process:  {PSY:31888}  Thought content:    {PSY:816-439-6120}  Sensory/Perceptual disturbances:    {PSY:614-220-7495}  Orientation:  {PSY:30297}  Attention:  {PSY:22877}  Concentration:  {PSY:540-051-2154}  Memory:  {PSY:(628)646-5867}  Fund of knowledge:   {PSY:540-051-2154}  Insight:    {PSY:540-051-2154}  Judgment:   {PSY:540-051-2154}  Impulse Control:  {PSY:540-051-2154}   Risk Assessment: Danger to Self:  No Self-injurious Behavior: No Danger to Others: No Duty to Warn:no Physical Aggression / Violence:No  Access to Firearms a concern: No  Gang Involvement:No   Subjective: Patient was receptive to feedback and intervention from LCSW and actively and effectively participated throughout the session. Patient is likely to benefit from future treatment because  remains motivated to decrease  And   and reports benefit of regular sessions in addressing these symptoms.    Interventions: Cognitive Behavioral Therapy and Client Centered  Checked in with patient regarding their week. LCSW assisted patient in processing their emotions about what they have experienced in ... and ... with / at .... LCSW reviewed behavior modification, distress tolerance and effective communication skills with patient. Provided support through active listening, validation of feelings, and highlighted patient's strengths.   Diagnosis:   ICD-10-CM   1. Generalized anxiety disorder  F41.1      Plan: Patient's goal of treatment is patient's goal is to manage her emotions, wants to be able to communicate better with her mom     Treatment Target: Understand the relationship between thoughts, emotions, and behaviors  Psychoeducation on CBT model   Teach the connection between thoughts, emotions, and behaviors  Treatment Target: Increase realistic balanced thinking -to learn how to replace thinking with thoughts that are more accurate or helpful Explore patient's thoughts, beliefs, automatic thoughts, assumptions  Identify and replace unhelpful thinking patterns (upsetting ideas, self-talk and mental images) Process distress and trauma and allow for emotional release  Questioning and challenging thoughts Cognitive reappraisal  Restructuring, Socratic questioning  Core beliefs  Treatment Target: Reducing vulnerability to "emotional mind" Values clarification and goal identification  Self-care - nutrition, sleep, exercise  Increase positive and pleasurable events  Mindfulness and Acceptance practices  Treatment Target: Increase skills Assertiveness communication  Boundary setting   Future Appointments  Date Time Provider Department [facility]  05/31/2022  3:00 PM Kathreen Cosier, LCSW [facility] None    Kathreen Cosier, LCSW

## 2022-06-16 ENCOUNTER — Ambulatory Visit (INDEPENDENT_AMBULATORY_CARE_PROVIDER_SITE_OTHER): Payer: Medicaid Other

## 2022-06-16 VITALS — BP 113/74 | HR 73 | Ht 60.0 in | Wt 202.4 lb

## 2022-06-16 DIAGNOSIS — Z3200 Encounter for pregnancy test, result unknown: Secondary | ICD-10-CM

## 2022-06-16 DIAGNOSIS — Z3201 Encounter for pregnancy test, result positive: Secondary | ICD-10-CM | POA: Diagnosis not present

## 2022-06-16 LAB — POCT URINE PREGNANCY: Preg Test, Ur: POSITIVE — AB

## 2022-06-16 NOTE — Progress Notes (Signed)
Patient presents today for pregnancy confirmation. UPT resulted in positive. LMP is unknown, estimated 11/10-11/14. She has been advised to schedule a NOB Nurse Interview at checkout.  Patient states no other questions or concerns.

## 2022-07-03 NOTE — L&D Delivery Note (Signed)
Delivery Note   Felicia Jones is a 27 y.o. [id number] at [redacted]w[redacted]d Estimated Date of Delivery: 02/11/23  PRE-OPERATIVE DIAGNOSIS:  1) [redacted]w[redacted]d pregnancy.  2) Obesity 3) HSV, on suppression 4) GBS positive  POST-OPERATIVE DIAGNOSIS:  1) [redacted]w[redacted]d pregnancy s/p Vaginal, Spontaneous Above and 2) GBS positive, not adequately treated 3) Laceration, repaired  Delivery Type: Vaginal, Spontaneous   Delivery Anesthesia: Local  Labor Complications:  Shoulder dystocia    ESTIMATED BLOOD LOSS: 200 ml    FINDINGS:   1) female infant, Apgar scores of 7   at 1 minute and 9   at 5 minutes and a birthweight of 142.86 ounces.     SPECIMENS:   PLACENTA:   Appearance: Intact   Removal: Spontaneous     Disposition:    CORD BLOOD: gases sent  DISPOSITION:  Infant left in stable condition in the delivery room, with L&D personnel and mother,  NARRATIVE SUMMARY: Labor course:  Felicia Jones is a [id number] at [redacted]w[redacted]d who presented to Labor & Delivery for labor management. She reports that her membranes ruptured around 1000. Her initial cervical exam was 9/5/0/+2 and she was spontaneously bearing down. Labor proceeded spontaneously. While in hands and knees position, the infant's head crowned for a significant period and mother was having some trouble completely expelling head. With excellent maternal pushing effort, she birthed the head of the infant which was tight against the perineum. Gentle traction was applied but the anterior shoulder was not relieved. Mother was repositioned to [place] and the anterior shoulder was then birthed after approximately 30 seconds. A viable female infant was born at 53 and was initially stunned and limp. After milking, the cord was clamped and cut by patient's mother and the infant was handed to waiting RN to bring to warmer for resuscitation. A section of the cord was saved for cord blood gas collection.  The placenta delivered spontaneously and was noted to be intact with a 3VC.  Patient was given 10U of IM pitocin since she had no IV.  A perineal and vaginal examination was performed. Episiotomy/Lacerations: 1st degree Lacerations were repaired with Vicryl suture using local anesthesia. The patient tolerated this well. After initial resuscitation, baby was returned skin-to-skin with mother. Mother and baby were left in stable condition.   [name], CNM 02/10/2023 12:23 PM

## 2022-07-07 ENCOUNTER — Other Ambulatory Visit (INDEPENDENT_AMBULATORY_CARE_PROVIDER_SITE_OTHER): Payer: Medicaid Other

## 2022-07-07 ENCOUNTER — Ambulatory Visit (INDEPENDENT_AMBULATORY_CARE_PROVIDER_SITE_OTHER): Payer: Medicaid Other

## 2022-07-07 VITALS — Wt 202.0 lb

## 2022-07-07 DIAGNOSIS — Z369 Encounter for antenatal screening, unspecified: Secondary | ICD-10-CM

## 2022-07-07 DIAGNOSIS — Z3482 Encounter for supervision of other normal pregnancy, second trimester: Secondary | ICD-10-CM

## 2022-07-07 DIAGNOSIS — Z3689 Encounter for other specified antenatal screening: Secondary | ICD-10-CM

## 2022-07-07 DIAGNOSIS — Z3480 Encounter for supervision of other normal pregnancy, unspecified trimester: Secondary | ICD-10-CM | POA: Insufficient documentation

## 2022-07-07 MED ORDER — CITRANATAL ASSURE 35-1 & 300 MG PO MISC: 2.0000 | Freq: Every day | ORAL | 3 refills | Status: DC

## 2022-07-07 NOTE — Patient Instructions (Signed)
First Trimester of Pregnancy  The first trimester of pregnancy starts on the first day of your last menstrual period until the end of week 12. This is also called months 1 through 3 of pregnancy. Body changes during your first trimester Your body goes through many changes during pregnancy. The changes usually return to normal after your baby is born. Physical changes You may gain or lose weight. Your breasts may grow larger and hurt. The area around your nipples may get darker. Dark spots or blotches may develop on your face. You may have changes in your hair. Health changes You may feel like you might vomit (nauseous), and you may vomit. You may have heartburn. You may have headaches. You may have trouble pooping (constipation). Your gums may bleed. Other changes You may get tired easily. You may pee (urinate) more often. Your menstrual periods will stop. You may not feel hungry. You may want to eat certain kinds of food. You may have changes in your emotions from day to day. You may have more dreams. Follow these instructions at home: Medicines Take over-the-counter and prescription medicines only as told by your doctor. Some medicines are not safe during pregnancy. Take a prenatal vitamin that contains at least 600 micrograms (mcg) of folic acid. Eating and drinking Eat healthy meals that include: Fresh fruits and vegetables. Whole grains. Good sources of protein, such as meat, eggs, or tofu. Low-fat dairy products. Avoid raw meat and unpasteurized juice, milk, and cheese. If you feel like you may vomit, or you vomit: Eat 4 or 5 small meals a day instead of 3 large meals. Try eating a few soda crackers. Drink liquids between meals instead of during meals. You may need to take these actions to prevent or treat trouble pooping: Drink enough fluids to keep your pee (urine) pale yellow. Eat foods that are high in fiber. These include beans, whole grains, and fresh fruits and  vegetables. Limit foods that are high in fat and sugar. These include fried or sweet foods. Activity Exercise only as told by your doctor. Most people can do their usual exercise routine during pregnancy. Stop exercising if you have cramps or pain in your lower belly (abdomen) or low back. Do not exercise if it is too hot or too humid, or if you are in a place of great height (high altitude). Avoid heavy lifting. If you choose to, you may have sex unless your doctor tells you not to. Relieving pain and discomfort Wear a good support bra if your breasts are sore. Rest with your legs raised (elevated) if you have leg cramps or low back pain. If you have bulging veins (varicose veins) in your legs: Wear support hose as told by your doctor. Raise your feet for 15 minutes, 3-4 times a day. Limit salt in your food. Safety Wear your seat belt at all times when you are in a car. Talk with your doctor if someone is hurting you or yelling at you. Talk with your doctor if you are feeling sad or have thoughts of hurting yourself. Lifestyle Do not use hot tubs, steam rooms, or saunas. Do not douche. Do not use tampons or scented sanitary pads. Do not use herbal medicines, illegal drugs, or medicines that are not approved by your doctor. Do not drink alcohol. Do not smoke or use any products that contain nicotine or tobacco. If you need help quitting, ask your doctor. Avoid cat litter boxes and soil that is used by cats. These carry   germs that can cause harm to the baby and can cause a loss of your baby by miscarriage or stillbirth. General instructions Keep all follow-up visits. This is important. Ask for help if you need counseling or if you need help with nutrition. Your doctor can give you advice or tell you where to go for help. Visit your dentist. At home, brush your teeth with a soft toothbrush. Floss gently. Write down your questions. Take them to your prenatal visits. Where to find more  information American Pregnancy Association: americanpregnancy.org American College of Obstetricians and Gynecologists: www.acog.org Office on Women's Health: womenshealth.gov/pregnancy Contact a doctor if: You are dizzy. You have a fever. You have mild cramps or pressure in your lower belly. You have a nagging pain in your belly area. You continue to feel like you may vomit, you vomit, or you have watery poop (diarrhea) for 24 hours or longer. You have a bad-smelling fluid coming from your vagina. You have pain when you pee. You are exposed to a disease that spreads from person to person, such as chickenpox, measles, Zika virus, HIV, or hepatitis. Get help right away if: You have spotting or bleeding from your vagina. You have very bad belly cramping or pain. You have shortness of breath or chest pain. You have any kind of injury, such as from a fall or a car crash. You have new or increased pain, swelling, or redness in an arm or leg. Summary The first trimester of pregnancy starts on the first day of your last menstrual period until the end of week 12 (months 1 through 3). Eat 4 or 5 small meals a day instead of 3 large meals. Do not smoke or use any products that contain nicotine or tobacco. If you need help quitting, ask your doctor. Keep all follow-up visits. This information is not intended to replace advice given to you by your health care provider. Make sure you discuss any questions you have with your health care provider. Document Revised: 11/26/2019 Document Reviewed: 10/02/2019 Elsevier Patient Education  2023 Elsevier Inc. Commonly Asked Questions During Pregnancy  Cats: A parasite can be excreted in cat feces.  To avoid exposure you need to have another person empty the little box.  If you must empty the litter box you will need to wear gloves.  Wash your hands after handling your cat.  This parasite can also be found in raw or undercooked meat so this should also be  avoided.  Colds, Sore Throats, Flu: Please check your medication sheet to see what you can take for symptoms.  If your symptoms are unrelieved by these medications please call the office.  Dental Work: Most any dental work your dentist recommends is permitted.  X-rays should only be taken during the first trimester if absolutely necessary.  Your abdomen should be shielded with a lead apron during all x-rays.  Please notify your provider prior to receiving any x-rays.  Novocaine is fine; gas is not recommended.  If your dentist requires a note from us prior to dental work please call the office and we will provide one for you.  Exercise: Exercise is an important part of staying healthy during your pregnancy.  You may continue most exercises you were accustomed to prior to pregnancy.  Later in your pregnancy you will most likely notice you have difficulty with activities requiring balance like riding a bicycle.  It is important that you listen to your body and avoid activities that put you at a higher   risk of falling.  Adequate rest and staying well hydrated are a must!  If you have questions about the safety of specific activities ask your provider.    Exposure to Children with illness: Try to avoid obvious exposure; report any symptoms to us when noted,  If you have chicken pos, red measles or mumps, you should be immune to these diseases.   Please do not take any vaccines while pregnant unless you have checked with your OB provider.  Fetal Movement: After 28 weeks we recommend you do "kick counts" twice daily.  Lie or sit down in a calm quiet environment and count your baby movements "kicks".  You should feel your baby at least 10 times per hour.  If you have not felt 10 kicks within the first hour get up, walk around and have something sweet to eat or drink then repeat for an additional hour.  If count remains less than 10 per hour notify your provider.  Fumigating: Follow your pest control agent's  advice as to how long to stay out of your home.  Ventilate the area well before re-entering.  Hemorrhoids:   Most over-the-counter preparations can be used during pregnancy.  Check your medication to see what is safe to use.  It is important to use a stool softener or fiber in your diet and to drink lots of liquids.  If hemorrhoids seem to be getting worse please call the office.   Hot Tubs:  Hot tubs Jacuzzis and saunas are not recommended while pregnant.  These increase your internal body temperature and should be avoided.  Intercourse:  Sexual intercourse is safe during pregnancy as long as you are comfortable, unless otherwise advised by your provider.  Spotting may occur after intercourse; report any bright red bleeding that is heavier than spotting.  Labor:  If you know that you are in labor, please go to the hospital.  If you are unsure, please call the office and let us help you decide what to do.  Lifting, straining, etc:  If your job requires heavy lifting or straining please check with your provider for any limitations.  Generally, you should not lift items heavier than that you can lift simply with your hands and arms (no back muscles)  Painting:  Paint fumes do not harm your pregnancy, but may make you ill and should be avoided if possible.  Latex or water based paints have less odor than oils.  Use adequate ventilation while painting.  Permanents & Hair Color:  Chemicals in hair dyes are not recommended as they cause increase hair dryness which can increase hair loss during pregnancy.  " Highlighting" and permanents are allowed.  Dye may be absorbed differently and permanents may not hold as well during pregnancy.  Sunbathing:  Use a sunscreen, as skin burns easily during pregnancy.  Drink plenty of fluids; avoid over heating.  Tanning Beds:  Because their possible side effects are still unknown, tanning beds are not recommended.  Ultrasound Scans:  Routine ultrasounds are performed  at approximately 20 weeks.  You will be able to see your baby's general anatomy an if you would like to know the gender this can usually be determined as well.  If it is questionable when you conceived you may also receive an ultrasound early in your pregnancy for dating purposes.  Otherwise ultrasound exams are not routinely performed unless there is a medical necessity.  Although you can request a scan we ask that you pay for it when   conducted because insurance does not cover " patient request" scans.  Work: If your pregnancy proceeds without complications you may work until your due date, unless your physician or employer advises otherwise.  Round Ligament Pain/Pelvic Discomfort:  Sharp, shooting pains not associated with bleeding are fairly common, usually occurring in the second trimester of pregnancy.  They tend to be worse when standing up or when you remain standing for long periods of time.  These are the result of pressure of certain pelvic ligaments called "round ligaments".  Rest, Tylenol and heat seem to be the most effective relief.  As the womb and fetus grow, they rise out of the pelvis and the discomfort improves.  Please notify the office if your pain seems different than that described.  It may represent a more serious condition.  Common Medications Safe in Pregnancy  Acne:      Constipation:  Benzoyl Peroxide     Colace  Clindamycin      Dulcolax Suppository  Topica Erythromycin     Fibercon  Salicylic Acid      Metamucil         Miralax AVOID:        Senakot   Accutane    Cough:  Retin-A       Cough Drops  Tetracycline      Phenergan w/ Codeine if Rx  Minocycline      Robitussin (Plain & DM)  Antibiotics:     Crabs/Lice:  Ceclor       RID  Cephalosporins    AVOID:  E-Mycins      Kwell  Keflex  Macrobid/Macrodantin   Diarrhea:  Penicillin      Kao-Pectate  Zithromax      Imodium AD         PUSH FLUIDS AVOID:       Cipro     Fever:  Tetracycline      Tylenol (Regular  or Extra  Minocycline       Strength)  Levaquin      Extra Strength-Do not          Exceed 8 tabs/24 hrs Caffeine:        <200mg/day (equiv. To 1 cup of coffee or  approx. 3 12 oz sodas)         Gas: Cold/Hayfever:       Gas-X  Benadryl      Mylicon  Claritin       Phazyme  **Claritin-D        Chlor-Trimeton    Headaches:  Dimetapp      ASA-Free Excedrin  Drixoral-Non-Drowsy     Cold Compress  Mucinex (Guaifenasin)     Tylenol (Regular or Extra  Sudafed/Sudafed-12 Hour     Strength)  **Sudafed PE Pseudoephedrine   Tylenol Cold & Sinus     Vicks Vapor Rub  Zyrtec  **AVOID if Problems With Blood Pressure         Heartburn: Avoid lying down for at least 1 hour after meals  Aciphex      Maalox     Rash:  Milk of Magnesia     Benadryl    Mylanta       1% Hydrocortisone Cream  Pepcid  Pepcid Complete   Sleep Aids:  Prevacid      Ambien   Prilosec       Benadryl  Rolaids       Chamomile Tea  Tums (Limit 4/day)     Unisom           Tylenol PM         Warm milk-add vanilla or  Hemorrhoids:       Sugar for taste  Anusol/Anusol H.C.  (RX: Analapram 2.5%)  Sugar Substitutes:  Hydrocortisone OTC     Ok in moderation  Preparation H      Tucks        Vaseline lotion applied to tissue with wiping    Herpes:     Throat:  Acyclovir      Oragel  Famvir  Valtrex     Vaccines:         Flu Shot Leg Cramps:       *Gardasil  Benadryl      Hepatitis A         Hepatitis B Nasal Spray:       Pneumovax  Saline Nasal Spray     Polio Booster         Tetanus Nausea:       Tuberculosis test or PPD  Vitamin B6 25 mg TID   AVOID:    Dramamine      *Gardasil  Emetrol       Live Poliovirus  Ginger Root 250 mg QID    MMR (measles, mumps &  High Complex Carbs @ Bedtime    rebella)  Sea Bands-Accupressure    Varicella (Chickenpox)  Unisom 1/2 tab TID     *No known complications           If received before Pain:         Known pregnancy;   Darvocet       Resume series  after  Lortab        Delivery  Percocet    Yeast:   Tramadol      Femstat  Tylenol 3      Gyne-lotrimin  Ultram       Monistat  Vicodin           MISC:         All Sunscreens           Hair Coloring/highlights          Insect Repellant's          (Including DEET)         Mystic Tans  

## 2022-07-07 NOTE — Progress Notes (Signed)
New OB Intake  I connected with  Felicia Jones on 07/07/22 at  2:15 PM EST by telephone and verified that I am speaking with the correct person using two identifiers. Nurse is located at [employer] and pt is located at home.  I explained I am completing New OB Intake today. We discussed her EDD of 02/16/2023 that is based on LMP of 05/12/2022. Pt is G4/P2012. I reviewed her allergies, medications, Medical/Surgical/OB history, and appropriate screenings. There are cats in the home:  no  Based on history, this is a/an pregnancy uncomplicated .   Patient Active Problem List   Diagnosis Date Noted   Supervision of other normal pregnancy, antepartum 07/07/2022   Generalized anxiety disorder 07/27/2021   Postpartum care following vaginal delivery 01/18/2021   Obesity (BMI 30.0-34.9) 06/16/2020   Bilateral low back pain without sciatica 01/28/2016    Concerns addressed today None  Delivery Plans:  Plans to deliver at [facility].  Anatomy US Explained first scheduled [place] will be scheduled soon and an anatomy scan will be done at 20 weeks.  Labs Discussed genetic screening with patient. Patient desires genetic testing to be drawn with new OBlabs. Discussed possible labs to be drawn at new OB appointment.  COVID Vaccine Patient has not had COVID vaccine.   Social Determinants of Health Food Insecurity: denies food insecurity Transportation: Patient denies transportation needs.  First visit review I reviewed new OB appt with pt. I explained she will have ob bloodwork and pap smear/pelvic exam if indicated. Explained pt will be seen by Philip Aspen, CNM at first visit; encounter routed to appropriate provider.   Cleophas Dunker, [place] 07/07/2022  2:38 PM

## 2022-07-10 ENCOUNTER — Other Ambulatory Visit: Payer: Self-pay

## 2022-07-10 MED ORDER — PROMETHAZINE HCL 25 MG PO TABS: 25.0000 mg | ORAL_TABLET | Freq: Four times a day (QID) | ORAL | 2 refills | Status: DC | PRN

## 2022-07-24 ENCOUNTER — Other Ambulatory Visit: Payer: Self-pay | Admitting: Certified Nurse Midwife

## 2022-07-24 ENCOUNTER — Ambulatory Visit
Admission: RE | Admit: 2022-07-24 | Discharge: 2022-07-24 | Disposition: A | Discharge status: Home / Self Care | Payer: Medicaid Other | Source: Ambulatory Visit | Attending: Certified Nurse Midwife | Admitting: Certified Nurse Midwife

## 2022-07-24 DIAGNOSIS — Z3A11 11 weeks gestation of pregnancy: Secondary | ICD-10-CM | POA: Insufficient documentation

## 2022-07-24 DIAGNOSIS — Z369 Encounter for antenatal screening, unspecified: Secondary | ICD-10-CM

## 2022-07-24 DIAGNOSIS — Z3480 Encounter for supervision of other normal pregnancy, unspecified trimester: Secondary | ICD-10-CM | POA: Insufficient documentation

## 2022-07-28 ENCOUNTER — Other Ambulatory Visit: Payer: Medicaid Other

## 2022-07-28 ENCOUNTER — Other Ambulatory Visit (HOSPITAL_COMMUNITY)
Admission: RE | Admit: 2022-07-28 | Discharge: 2022-07-28 | Disposition: A | Discharge status: Home / Self Care | Payer: Medicaid Other | Source: Ambulatory Visit | Attending: Certified Nurse Midwife | Admitting: Certified Nurse Midwife

## 2022-07-28 DIAGNOSIS — Z3480 Encounter for supervision of other normal pregnancy, unspecified trimester: Secondary | ICD-10-CM

## 2022-07-28 DIAGNOSIS — Z369 Encounter for antenatal screening, unspecified: Secondary | ICD-10-CM | POA: Insufficient documentation

## 2022-07-28 LAB — OB RESULTS CONSOLE VARICELLA ZOSTER ANTIBODY, IGG: Varicella: IMMUNE

## 2022-07-29 LAB — URINALYSIS, ROUTINE W REFLEX MICROSCOPIC
Bilirubin, UA: NEGATIVE
Glucose, UA: NEGATIVE
Ketones, UA: NEGATIVE
Nitrite, UA: NEGATIVE
Protein,UA: NEGATIVE
RBC, UA: NEGATIVE
Specific Gravity, UA: >=1.03 — AB (ref 1.005–1.030)
Urobilinogen, Ur: 0.2 mg/dL (ref 0.2–1.0)
pH, UA: 5.5 (ref 5.0–7.5)

## 2022-07-29 LAB — CBC/D/PLT+RPR+RH+ABO+RUBIGG...
Antibody Screen: NEGATIVE
Basophils Absolute: 0 10*3/uL (ref 0.0–0.2)
Basos: 0 %
EOS (ABSOLUTE): 0.2 10*3/uL (ref 0.0–0.4)
Eos: 2 %
HCV Ab: NONREACTIVE
HIV Screen 4th Generation wRfx: NONREACTIVE
Hematocrit: 37.3 % (ref 34.0–46.6)
Hemoglobin: 12.1 g/dL (ref 11.1–15.9)
Hepatitis B Surface Ag: NEGATIVE
Immature Grans (Abs): 0 10*3/uL (ref 0.0–0.1)
Immature Granulocytes: 1 %
Lymphocytes Absolute: 2.2 10*3/uL (ref 0.7–3.1)
Lymphs: 25 %
MCH: 27.1 pg (ref 26.6–33.0)
MCHC: 32.4 g/dL (ref 31.5–35.7)
MCV: 84 fL (ref 79–97)
Monocytes Absolute: 0.6 10*3/uL (ref 0.1–0.9)
Monocytes: 7 %
Neutrophils Absolute: 5.6 10*3/uL (ref 1.4–7.0)
Neutrophils: 65 %
Platelets: 254 10*3/uL (ref 150–450)
RBC: 4.46 x10E6/uL (ref 3.77–5.28)
RDW: 14.9 % (ref 11.7–15.4)
RPR Ser Ql: NONREACTIVE
Rh Factor: POSITIVE
Rubella Antibodies, IGG: 1.72 index (ref >0.99)
Varicella zoster IgG: 762 index (ref >165)
WBC: 8.6 10*3/uL (ref 3.4–10.8)

## 2022-07-29 LAB — MICROSCOPIC EXAMINATION
Casts: NONE SEEN /lpf
Epithelial Cells (non renal): >10 /hpf — AB (ref 0–10)

## 2022-07-29 LAB — HCV INTERPRETATION

## 2022-07-30 LAB — URINE CULTURE, OB REFLEX

## 2022-07-30 LAB — CULTURE, OB URINE

## 2022-07-31 LAB — MONITOR DRUG PROFILE 14(MW)
Amphetamine Scrn, Ur: NEGATIVE ng/mL
BARBITURATE SCREEN URINE: NEGATIVE ng/mL
BENZODIAZEPINE SCREEN, URINE: NEGATIVE ng/mL
Buprenorphine, Urine: NEGATIVE ng/mL
CANNABINOIDS UR QL SCN: NEGATIVE ng/mL
Cocaine (Metab) Scrn, Ur: NEGATIVE ng/mL
Creatinine(Crt), U: 168.7 mg/dL (ref 20.0–300.0)
Fentanyl, Urine: NEGATIVE pg/mL
Meperidine Screen, Urine: NEGATIVE ng/mL
Methadone Screen, Urine: NEGATIVE ng/mL
OXYCODONE+OXYMORPHONE UR QL SCN: NEGATIVE ng/mL
Opiate Scrn, Ur: NEGATIVE ng/mL
Ph of Urine: 5.5 (ref 4.5–8.9)
Phencyclidine Qn, Ur: NEGATIVE ng/mL
Propoxyphene Scrn, Ur: NEGATIVE ng/mL
SPECIFIC GRAVITY: 1.026
Tramadol Screen, Urine: NEGATIVE ng/mL

## 2022-07-31 LAB — URINE CYTOLOGY ANCILLARY ONLY
Chlamydia: NEGATIVE
Comment: NEGATIVE
Comment: NORMAL
Neisseria Gonorrhea: NEGATIVE

## 2022-07-31 LAB — NICOTINE SCREEN, URINE: Cotinine Ql Scrn, Ur: NEGATIVE ng/mL

## 2022-08-02 LAB — MATERNIT 21 PLUS CORE, BLOOD
Fetal Fraction: 9
Result (T21): NEGATIVE
Trisomy 13 (Patau syndrome): NEGATIVE
Trisomy 18 (Edwards syndrome): NEGATIVE
Trisomy 21 (Down syndrome): NEGATIVE

## 2022-08-04 ENCOUNTER — Encounter: Payer: Self-pay | Admitting: Certified Nurse Midwife

## 2022-08-04 ENCOUNTER — Ambulatory Visit (INDEPENDENT_AMBULATORY_CARE_PROVIDER_SITE_OTHER): Payer: Medicaid Other | Admitting: Certified Nurse Midwife

## 2022-08-04 VITALS — BP 104/69 | HR 88 | Wt 204.5 lb

## 2022-08-04 DIAGNOSIS — Z3A12 12 weeks gestation of pregnancy: Secondary | ICD-10-CM

## 2022-08-04 DIAGNOSIS — Z3481 Encounter for supervision of other normal pregnancy, first trimester: Secondary | ICD-10-CM | POA: Diagnosis not present

## 2022-08-04 LAB — POCT URINALYSIS DIPSTICK OB
Bilirubin, UA: NEGATIVE
Blood, UA: NEGATIVE
Glucose, UA: NEGATIVE
Ketones, UA: NEGATIVE
Nitrite, UA: NEGATIVE
POC,PROTEIN,UA: NEGATIVE
Spec Grav, UA: 1.025 (ref 1.010–1.025)
Urobilinogen, UA: 0.2 E.U./dL
pH, UA: 6 (ref 5.0–8.0)

## 2022-08-04 MED ORDER — ASPIRIN 81 MG PO TBEC: 81.0000 mg | DELAYED_RELEASE_TABLET | Freq: Every day | ORAL | 12 refills | Status: DC

## 2022-08-04 MED ORDER — COMPLETENATE 29-1 MG PO CHEW: 1.0000 | CHEWABLE_TABLET | Freq: Every day | ORAL | 11 refills | Status: DC

## 2022-08-04 NOTE — Progress Notes (Signed)
NEW OB HISTORY AND PHYSICAL  SUBJECTIVE:       Felicia Jones is a 27 y.o. [phone] female, Patient's last menstrual period was 05/12/2022 (approximate)., Estimated Date of Delivery: 02/11/23, [redacted]w[redacted]d, presents today for establishment of Prenatal Care. She has no unusual complaints.   Body mass index is 39.94 kg/m.    Gynecologic History Patient's last menstrual period was 05/12/2022 (approximate). Normal Contraception: none Last Pap: 06/16/2020. Results were: normal  Obstetric History OB History  Gravida Para Term Preterm AB Living  4 2 2   1 2   SAB IAB Ectopic Multiple Live Births    1   0 2    # Outcome Date GA Lbr Len/2nd Weight Sex Delivery Anes PTL Lv  4 Current           3 Term 01/16/21 [redacted]w[redacted]d 08:26 / 00:05 6 lb 9.5 oz (2.99 kg) M Vag-Spont None  LIV  2 IAB 2022          1 Term 05/16/18 [redacted]w[redacted]d / 03:44 6 lb 15.5 oz (3.16 kg) M Vag-Spont EPI  LIV    Past Medical History:  Diagnosis Date   Arthritis    in back   Migraines     Past Surgical History:  Procedure Laterality Date   WISDOM TOOTH EXTRACTION  2016   four;    Current Outpatient Medications on File Prior to Visit  Medication Sig Dispense Refill   Prenat w/o A-FeCbGl-[id number]-FA-DHA (CITRANATAL ASSURE) 35-1 & 300 MG tablet Take 2 tablets by mouth daily. 60 each 3   promethazine (PHENERGAN) 25 MG tablet Take 1 tablet (25 mg total) by mouth every 6 (six) hours as needed for nausea or vomiting. 30 tablet 2   valACYclovir (VALTREX) 500 MG tablet Take 1 tablet by mouth daily.     No current facility-administered medications on file prior to visit.    No Known Allergies  Social History   Socioeconomic History   Marital status: Married    Spouse name: DaQuan   Number of children: 2   Years of education: 12   Highest education level: High school graduate  Occupational History   Occupation: grocery store  Tobacco Use   Smoking status: Never   Smokeless tobacco: Never  Vaping Use   Vaping Use: Never used   Substance and Sexual Activity   Alcohol use: Not Currently    Alcohol/week: 1.0 standard drink of alcohol    Types: 1 Glasses of wine per week    Comment: very occasional   Drug use: No   Sexual activity: Yes    Partners: Male    Birth control/protection: None  Other Topics Concern   Not on file  Social History Narrative   Patient lives alone with her two children. She has some support from the children's father and has supportive family relationships.    Social Determinants of Health   Financial Resource Strain: Medium Risk (07/07/2022)   Overall Financial Resource Strain (CARDIA)    Difficulty of Paying Living Expenses: Somewhat hard  Food Insecurity: No Food Insecurity (07/07/2022)   Hunger Vital Sign    Worried About Running Out of Food in the Last Year: Never true    Ran Out of Food in the Last Year: Never true  Transportation Needs: No Transportation Needs (07/07/2022)   PRAPARE - Hydrologist (Medical): No    Lack of Transportation (Non-Medical): No  Physical Activity: Insufficiently Active (07/07/2022)   Exercise Vital Sign    Days  of Exercise per Week: 3 days    Minutes of Exercise per Session: 30 min  Stress: No Stress Concern Present (07/07/2022)   [place]    Feeling of Stress : Not at all  Social Connections: Moderately Integrated (07/07/2022)   Social Connection and Isolation Panel [NHANES]    Frequency of Communication with Friends and Family: More than three times a week    Frequency of Social Gatherings with Friends and Family: Three times a week    Attends Religious Services: More than 4 times per year    Active Member of Clubs or Organizations: No    Attends Archivist Meetings: Never    Marital Status: Married  Human resources officer Violence: Not At Risk (07/07/2022)   Humiliation, Afraid, Rape, and Kick questionnaire    Fear of Current or Ex-Partner: No     Emotionally Abused: No    Physically Abused: No    Sexually Abused: No    Family History  Problem Relation Age of Onset   Migraines Mother    Healthy Father    Healthy Half-Sister    Healthy Half-Sister    Healthy Half-Sister    Healthy Half-Sister    Anxiety disorder Brother    Healthy Brother    Healthy Brother    Diabetes Maternal Grandmother    Hypertension Maternal Grandmother    Diabetes Maternal Grandfather    Hypertension Maternal Grandfather    Cancer Paternal Grandmother        unknown kind   Healthy Paternal Grandfather     The following portions of the patient's history were reviewed and updated as appropriate: allergies, current medications, past OB history, past medical history, past surgical history, past family history, past social history, and problem list.    OBJECTIVE: Initial Physical Exam (New OB)  GENERAL APPEARANCE: alert, well appearing, in no apparent distress, oriented to person, place and time HEAD: normocephalic, atraumatic MOUTH: mucous membranes moist, pharynx normal without lesions THYROID: no thyromegaly or masses present BREASTS: no masses noted, no significant tenderness, no palpable axillary nodes, no skin changes LUNGS: clear to auscultation, no wheezes, rales or rhonchi, symmetric air entry HEART: regular rate and rhythm, no murmurs ABDOMEN: soft, nontender, nondistended, no abnormal masses, no epigastric pain, obese, and FHT present EXTREMITIES: no redness or tenderness in the calves or thighs, no edema, no limitation in range of motion, intact peripheral pulses SKIN: normal coloration and turgor, no rashes LYMPH NODES: no adenopathy palpable NEUROLOGIC: alert, oriented, normal speech, no focal findings or movement disorder noted  PELVIC EXAM pt declines pelvic exam and pap smear   ASSESSMENT: Normal pregnancy  PLAN: Prenatal care See ordersNew OB counseling: The patient has been given an overview regarding routine prenatal  care. Recommendations regarding diet, weight gain, and exercise in pregnancy were given. Prenatal testing, optional genetic testing, carrier screening, and ultrasound use in pregnancy were reviewed. Matnerit 21 collected-negative.  Benefits of Breast Feeding were discussed. The patient is encouraged to consider nursing her baby post partum.  Philip Aspen, CNM

## 2022-08-04 NOTE — Addendum Note (Signed)
Addended by: Hildred Priest on: 08/04/2022 03:46 PM   Modules accepted: Orders

## 2022-08-04 NOTE — Patient Instructions (Signed)
Prenatal Care Prenatal care is health care during pregnancy. It helps you and your unborn baby (fetus) stay as healthy as possible. Prenatal care may be provided by a midwife, a family practice doctor, a IT consultant (nurse practitioner or physician assistant), or a childbirth and pregnancy doctor (obstetrician). How does this affect me? During pregnancy, you will be closely monitored for any new conditions that might develop. To lower your risk of pregnancy complications, you and your health care provider will talk about any underlying conditions you have. How does this affect my baby? Early and consistent prenatal care increases the chance that your baby will be healthy during pregnancy. Prenatal care lowers the risk that your baby will be: Born early (prematurely). Smaller than expected at birth (small for gestational age). What can I expect at the first prenatal care visit? Your first prenatal care visit will likely be the longest. You should schedule your first prenatal care visit as soon as you know that you are pregnant. Your first visit is a good time to talk about any questions or concerns you have about pregnancy. Medical history At your visit, you and your health care provider will talk about your medical history, including: Any past pregnancies. Your family's medical history. Medical history of the baby's father. Any long-term (chronic) health conditions you have and how you manage them. Any surgeries or procedures you have had. Any current over-the-counter or prescription medicines, herbs, or supplements that you are taking. Other factors that could pose a risk to your baby, including: Exposure to harmful chemicals or radiation at work or at home. Any substance use, including tobacco, alcohol, and drug use. Your home setting and your stress levels, including: Exposure to abuse or violence. Household financial strain. Your daily health habits, including diet and  exercise. Tests and screenings Your health care provider will: Measure your weight, height, and blood pressure. Do a physical exam, including a pelvic and breast exam. Perform blood tests and urine tests to check for: Urinary tract infection. Sexually transmitted infections (STIs). Low iron levels in your blood (anemia). Blood type and certain proteins on red blood cells (Rh antibodies). Infections and immunity to viruses, such as hepatitis B and rubella. HIV (human immunodeficiency virus). Discuss your options for genetic screening. Tips about staying healthy Your health care provider will also give you information about how to keep yourself and your baby healthy, including: Nutrition and taking vitamins. Physical activity. How to manage pregnancy symptoms such as nausea and vomiting (morning sickness). Infections and substances that may be harmful to your baby and how to avoid them. Food safety. Dental care. Working. Travel. Warning signs to watch for and when to call your health care provider. How often will I have prenatal care visits? After your first prenatal care visit, you will have regular visits throughout your pregnancy. The visit schedule is often as follows: Up to week 28 of pregnancy: once every 4 weeks. 28-36 weeks: once every 2 weeks. After 36 weeks: every week until delivery. Some women may have visits more or less often depending on any underlying health conditions and the health of the baby. Keep all follow-up and prenatal care visits. This is important. What happens during routine prenatal care visits? Your health care provider will: Measure your weight and blood pressure. Check for fetal heart sounds. Measure the height of your uterus in your abdomen (fundal height). This may be measured starting around week 20 of pregnancy. Check the position of your baby inside your uterus. Ask questions  about your diet, sleeping patterns, and whether you can feel the baby  move. Review warning signs to watch for and signs of labor. Ask about any pregnancy symptoms you are having and how you are dealing with them. Symptoms may include: Headaches. Nausea and vomiting. Vaginal discharge. Swelling. Fatigue. Constipation. Changes in your vision. Feeling persistently sad or anxious. Any discomfort, including back or pelvic pain. Bleeding or spotting. Make a list of questions to ask your health care provider at your routine visits. What tests might I have during prenatal care visits? You may have blood, urine, and imaging tests throughout your pregnancy, such as: Urine tests to check for glucose, protein, or signs of infection. Glucose tests to check for a form of diabetes that can develop during pregnancy (gestational diabetes mellitus). This is usually done around week 24 of pregnancy. Ultrasounds to check your baby's growth and development, to check for birth defects, and to check your baby's well-being. These can also help to decide when you should deliver your baby. A test to check for group B strep (GBS) infection. This is usually done around week 36 of pregnancy. Genetic testing. This may include blood, fluid, or tissue sampling, or imaging tests, such as an ultrasound. Some genetic tests are done during the first trimester and some are done during the second trimester. What else can I expect during prenatal care visits? Your health care provider may recommend getting certain vaccines during pregnancy. These may include: A yearly flu shot (annual influenza vaccine). This is especially important if you will be pregnant during flu season. Tdap (tetanus, diphtheria, pertussis) vaccine. Getting this vaccine during pregnancy can protect your baby from whooping cough (pertussis) after birth. This vaccine may be recommended between weeks 27 and 36 of pregnancy. A COVID-19 vaccine. Later in your pregnancy, your health care provider may give you information  about: Childbirth and breastfeeding classes. Choosing a health care provider for your baby. Umbilical cord banking. Breastfeeding. Birth control after your baby is born. The hospital labor and delivery unit and how to set up a tour. Registering at the hospital before you go into labor. Where to find more information Office on Women's Health: LegalWarrants.gl American Pregnancy Association: americanpregnancy.org March of Dimes: marchofdimes.org Summary Prenatal care helps you and your baby stay as healthy as possible during pregnancy. Your first prenatal care visit will most likely be the longest. You will have visits and tests throughout your pregnancy to monitor your health and your baby's health. Bring a list of questions to your visits to ask your health care provider. Make sure to keep all follow-up and prenatal care visits. This information is not intended to replace advice given to you by your health care provider. Make sure you discuss any questions you have with your health care provider. Document Revised: 04/01/2020 Document Reviewed: 04/01/2020 Elsevier Patient Education  [place].

## 2022-08-10 ENCOUNTER — Other Ambulatory Visit: Payer: Self-pay

## 2022-08-10 ENCOUNTER — Emergency Department: Payer: Medicaid Other

## 2022-08-10 ENCOUNTER — Emergency Department
Admission: EM | Admit: 2022-08-10 | Discharge: 2022-08-10 | Disposition: A | Discharge status: Home / Self Care | Payer: Medicaid Other | Attending: Emergency Medicine | Admitting: Emergency Medicine

## 2022-08-10 DIAGNOSIS — O2391 Unspecified genitourinary tract infection in pregnancy, first trimester: Secondary | ICD-10-CM | POA: Diagnosis not present

## 2022-08-10 DIAGNOSIS — M545 Low back pain, unspecified: Secondary | ICD-10-CM | POA: Diagnosis not present

## 2022-08-10 DIAGNOSIS — Z3A13 13 weeks gestation of pregnancy: Secondary | ICD-10-CM | POA: Diagnosis not present

## 2022-08-10 DIAGNOSIS — R110 Nausea: Secondary | ICD-10-CM

## 2022-08-10 DIAGNOSIS — R8271 Bacteriuria: Secondary | ICD-10-CM

## 2022-08-10 DIAGNOSIS — R1084 Generalized abdominal pain: Secondary | ICD-10-CM

## 2022-08-10 LAB — CBC WITH DIFFERENTIAL/PLATELET
Abs Immature Granulocytes: 0.04 10*3/uL (ref 0.00–0.07)
Basophils Absolute: 0 10*3/uL (ref 0.0–0.1)
Basophils Relative: 0 %
Eosinophils Absolute: 0 10*3/uL (ref 0.0–0.5)
Eosinophils Relative: 0 %
HCT: 35.8 % — ABNORMAL LOW (ref 36.0–46.0)
Hemoglobin: 11.8 g/dL — ABNORMAL LOW (ref 12.0–15.0)
Immature Granulocytes: 0 %
Lymphocytes Relative: 13 %
Lymphs Abs: 1.2 10*3/uL (ref 0.7–4.0)
MCH: 27.1 pg (ref 26.0–34.0)
MCHC: 33 g/dL (ref 30.0–36.0)
MCV: 82.3 fL (ref 80.0–100.0)
Monocytes Absolute: 0.4 10*3/uL (ref 0.1–1.0)
Monocytes Relative: 4 %
Neutro Abs: 7.7 10*3/uL (ref 1.7–7.7)
Neutrophils Relative %: 83 %
Platelets: 235 10*3/uL (ref 150–400)
RBC: 4.35 MIL/uL (ref 3.87–5.11)
RDW: 14 % (ref 11.5–15.5)
WBC: 9.4 10*3/uL (ref 4.0–10.5)
nRBC: 0 % (ref 0.0–0.2)

## 2022-08-10 LAB — COMPREHENSIVE METABOLIC PANEL
ALT: 10 U/L (ref 0–44)
AST: 16 U/L (ref 15–41)
Albumin: 3.6 g/dL (ref 3.5–5.0)
Alkaline Phosphatase: 40 U/L (ref 38–126)
Anion gap: 8 (ref 5–15)
BUN: 8 mg/dL (ref 6–20)
CO2: 22 mmol/L (ref 22–32)
Calcium: 8.5 mg/dL — ABNORMAL LOW (ref 8.9–10.3)
Chloride: 105 mmol/L (ref 98–111)
Creatinine, Ser: 0.48 mg/dL (ref 0.44–1.00)
GFR, Estimated: >60 mL/min (ref >60)
Glucose, Bld: 108 mg/dL — ABNORMAL HIGH (ref 70–99)
Potassium: 3.6 mmol/L (ref 3.5–5.1)
Sodium: 135 mmol/L (ref 135–145)
Total Bilirubin: 0.6 mg/dL (ref 0.3–1.2)
Total Protein: 6.9 g/dL (ref 6.5–8.1)

## 2022-08-10 LAB — URINALYSIS, ROUTINE W REFLEX MICROSCOPIC
Bilirubin Urine: NEGATIVE
Glucose, UA: NEGATIVE mg/dL
Hgb urine dipstick: NEGATIVE
Ketones, ur: NEGATIVE mg/dL
Nitrite: NEGATIVE
Protein, ur: NEGATIVE mg/dL
Specific Gravity, Urine: 1.005 (ref 1.005–1.030)
pH: 6 (ref 5.0–8.0)

## 2022-08-10 LAB — LIPASE, BLOOD: Lipase: 28 U/L (ref 11–51)

## 2022-08-10 LAB — HCG, QUANTITATIVE, PREGNANCY: hCG, Beta Chain, Quant, S: 119055 m[IU]/mL — ABNORMAL HIGH (ref <5)

## 2022-08-10 MED ORDER — LACTATED RINGERS IV BOLUS
1000.0000 mL | Freq: Once | INTRAVENOUS | Status: AC
  Administered 2022-08-10: 1000 mL via INTRAVENOUS

## 2022-08-10 MED ORDER — ACETAMINOPHEN 500 MG PO TABS
1000.0000 mg | ORAL_TABLET | Freq: Once | ORAL | Status: AC
  Administered 2022-08-10: 1000 mg via ORAL
  Filled 2022-08-10: qty 2

## 2022-08-10 MED ORDER — NITROFURANTOIN MONOHYD MACRO 100 MG PO CAPS: 100.0000 mg | ORAL_CAPSULE | Freq: Two times a day (BID) | ORAL | 0 refills | Status: AC

## 2022-08-10 MED ORDER — NITROFURANTOIN MONOHYD MACRO 100 MG PO CAPS
100.0000 mg | ORAL_CAPSULE | Freq: Once | ORAL | Status: AC
  Administered 2022-08-10: 100 mg via ORAL
  Filled 2022-08-10: qty 1

## 2022-08-10 MED ORDER — ONDANSETRON HCL 4 MG/2ML IJ SOLN
4.0000 mg | Freq: Once | INTRAMUSCULAR | Status: AC
  Administered 2022-08-10: 4 mg via INTRAVENOUS
  Filled 2022-08-10: qty 2

## 2022-08-10 NOTE — Discharge Instructions (Addendum)
You are being discharged with Macrobid/nitrofurantoin antibiotic for 3 days to treat the bacteria in your urine.  Follow-up with your OB/GYN.  Return to the ED with any worsening symptoms.   Use Tylenol for pain and fevers.  Up to 1000 mg per dose, up to 4 times per day.  Do not take more than 4000 mg of Tylenol/acetaminophen within 24 hours.Marland Kitchen

## 2022-08-10 NOTE — ED Notes (Signed)
Pt provided crackers and water 

## 2022-08-10 NOTE — ED Triage Notes (Addendum)
Pt to ED via POV c/o abd pain started yesterday morning and nausea. Denies any diarrhea, fevers. Pt denies any CP/ SOB. NAD at this time. Last BM yesterday morning. Pt states she is around [redacted] weeks pregnant

## 2022-08-10 NOTE — ED Notes (Signed)
Pt to MRI

## 2022-08-10 NOTE — ED Notes (Signed)
Pt still in [place] at this time; unable to obtain repeat VS nor pain reassessment.

## 2022-08-10 NOTE — ED Notes (Signed)
Pt to [place]; Pt provided a urine cup in the event she can provide a sample while off the floor at [place].

## 2022-08-10 NOTE — ED Provider Notes (Signed)
[facility] Provider Note    Event Date/Time   First MD Initiated Contact with Patient 08/10/22 [phone]     (approximate)   History   Abdominal Pain   HPI  Felicia Jones is a 27 y.o. female who presents to the ED for evaluation of Abdominal Pain   I reviewed 2/2 initial prenatal visit.  LMP 05/12/2022. G4 P2-0-1-2 at about 13 weeks now.  Morbidly obese.  No intra-abdominal surgical history documented.  Patient presents with her husband for evaluation of about 24 hours of nausea, abdominal and back discomfort.  She reports no episodes of emesis, but feeling more nauseous than she would expect.  Reports this gestation has had more nausea than previous ones, but this past day has been more severe.   Reports lower back and abd cramping. No vaginal bleeding or new discharge, no urinary symptoms such as dysuria, no fever.   Physical Exam   Triage Vital Signs: ED Triage Vitals  Enc Vitals Group     BP      Pulse      Resp      Temp      Temp src      SpO2      Weight      Height      Head Circumference      Peak Flow      Pain Score      Pain Loc      Pain Edu?      Excl. in [place]?     Most recent vital signs: Vitals:   08/10/22 0456 08/10/22 0558  BP:    Pulse: 96   Resp:    Temp:  97.9 F (36.6 C)  SpO2: 99%     General: Awake, no distress.  CV:  Good peripheral perfusion.  Resp:  Normal effort.  Abd:  No distention.  RUQ and RLQ tenderness is present and mild.  No peritoneal features. MSK:  No deformity noted. No signs of trauma to the back, no TTP to the back Neuro:  No focal deficits appreciated. Other:     ED Results / Procedures / Treatments   Labs (all labs ordered are listed, but only abnormal results are displayed) Labs Reviewed  CBC WITH DIFFERENTIAL/PLATELET - Abnormal; Notable for the following components:      Result Value   Hemoglobin 11.8 (*)    HCT 35.8 (*)    All other components within normal limits   COMPREHENSIVE METABOLIC PANEL - Abnormal; Notable for the following components:   Glucose, Bld 108 (*)    Calcium 8.5 (*)    All other components within normal limits  URINALYSIS, ROUTINE W REFLEX MICROSCOPIC - Abnormal; Notable for the following components:   Color, Urine STRAW (*)    APPearance HAZY (*)    Leukocytes,Ua LARGE (*)    Bacteria, UA RARE (*)    All other components within normal limits  HCG, QUANTITATIVE, PREGNANCY - Abnormal; Notable for the following components:   hCG, Beta Chain, Quant, S 119,055 (*)    All other components within normal limits  LIPASE, BLOOD    EKG   RADIOLOGY RUQ ultrasound interpreted by me without evidence of cholelithiasis or cholecystitis. Pelvic ultrasound interpreted by me with IUP in place.  Official radiology report(s): MR ABDOMEN WO CONTRAST  Result Date: 08/10/2022 CLINICAL DATA:  27 year old female thirteen weeks pregnant presenting with abdominal pain and emesis. Evaluate for potential acute appendicitis. EXAM: MRI ABDOMEN AND PELVIS  WITHOUT CONTRAST TECHNIQUE: Multiplanar multisequence MR imaging of the abdomen and pelvis was performed. No intravenous contrast was administered. COMPARISON:  No priors. FINDINGS: COMBINED FINDINGS FOR BOTH MR ABDOMEN AND PELVIS Lower chest: Unremarkable. Hepatobiliary: No definite suspicious cystic or solid hepatic lesions are confidently identified on today's noncontrast examination. No intra or extrahepatic biliary ductal dilatation. Common bile duct measures 3 mm in the porta hepatis. Gallbladder is unremarkable in appearance. Pancreas: No definite pancreatic mass or peripancreatic fluid collections or inflammatory changes noted on today's noncontrast examination. No pancreatic ductal dilatation. Spleen:  Unremarkable. Adrenals/Urinary Tract: Bilateral kidneys and adrenal glands are normal in appearance. No hydroureteronephrosis. Urinary bladder is unremarkable in appearance. Stomach/Bowel: The appendix is  clearly identifiable in a retrocecal position, best appreciated on image 20 of series 12 where measures approximately 4 mm in diameter. No periappendiceal fluid or inflammatory changes. Remaining portions of the stomach, small bowel and colon are unremarkable. Vascular/Lymphatic: No aneurysm identified in the abdominal or pelvic vasculature. No lymphadenopathy noted in the abdomen or pelvis. Reproductive: Gravid uterus with single IUP as previously described. Other: No significant volume of ascites noted in the visualized portions of the peritoneal cavity. Musculoskeletal: No aggressive appearing osseous lesions are noted in the visualized portions of the skeleton. IMPRESSION: 1. Normal appendix. No acute findings are noted in the abdomen or pelvis to account for the patient's symptoms. 2. Gravid uterus with single IUP as previously described on recent pelvic ultrasound. Electronically Signed   By: Trudie Reed M.D.   On: 08/10/2022 05:45   MR PELVIS WO CONTRAST  Result Date: 08/10/2022 CLINICAL DATA:  27 year old female thirteen weeks pregnant presenting with abdominal pain and emesis. Evaluate for potential acute appendicitis. EXAM: MRI ABDOMEN AND PELVIS WITHOUT CONTRAST TECHNIQUE: Multiplanar multisequence MR imaging of the abdomen and pelvis was performed. No intravenous contrast was administered. COMPARISON:  No priors. FINDINGS: COMBINED FINDINGS FOR BOTH MR ABDOMEN AND PELVIS Lower chest: Unremarkable. Hepatobiliary: No definite suspicious cystic or solid hepatic lesions are confidently identified on today's noncontrast examination. No intra or extrahepatic biliary ductal dilatation. Common bile duct measures 3 mm in the porta hepatis. Gallbladder is unremarkable in appearance. Pancreas: No definite pancreatic mass or peripancreatic fluid collections or inflammatory changes noted on today's noncontrast examination. No pancreatic ductal dilatation. Spleen:  Unremarkable. Adrenals/Urinary Tract:  Bilateral kidneys and adrenal glands are normal in appearance. No hydroureteronephrosis. Urinary bladder is unremarkable in appearance. Stomach/Bowel: The appendix is clearly identifiable in a retrocecal position, best appreciated on image 20 of series 12 where measures approximately 4 mm in diameter. No periappendiceal fluid or inflammatory changes. Remaining portions of the stomach, small bowel and colon are unremarkable. Vascular/Lymphatic: No aneurysm identified in the abdominal or pelvic vasculature. No lymphadenopathy noted in the abdomen or pelvis. Reproductive: Gravid uterus with single IUP as previously described. Other: No significant volume of ascites noted in the visualized portions of the peritoneal cavity. Musculoskeletal: No aggressive appearing osseous lesions are noted in the visualized portions of the skeleton. IMPRESSION: 1. Normal appendix. No acute findings are noted in the abdomen or pelvis to account for the patient's symptoms. 2. Gravid uterus with single IUP as previously described on recent pelvic ultrasound. Electronically Signed   By: Trudie Reed M.D.   On: 08/10/2022 05:45   US OB Comp Less 14 Wks  Result Date: 08/10/2022 CLINICAL DATA:  Abdominal cramping EXAM: OBSTETRIC <14 WK ULTRASOUND TECHNIQUE: Transabdominal ultrasound was performed for evaluation of the gestation as well as the maternal uterus and adnexal  regions. COMPARISON:  None Available. FINDINGS: Intrauterine gestational sac: Single Yolk sac:  Not Visualized. Embryo:  Visualized. Cardiac Activity: Visualized. Heart Rate: 169 bpm MSD:    mm    w     d CRL:   6.9 mm   13 w 1 d                  [place] EDC: 02/14/2023 Subchorionic hemorrhage:  None visualized. Maternal uterus/adnexae: No adnexal mass or free fluid. IMPRESSION: Thirteen week 1 day intrauterine pregnancy. Fetal heart rate 169 beats per minute. No acute maternal findings. Electronically Signed   By: Rolm Baptise M.D.   On: 08/10/2022 03:47   US ABDOMEN  LIMITED RUQ (LIVER/GB)  Result Date: 08/10/2022 CLINICAL DATA:  Abdominal cramping, vomiting EXAM: ULTRASOUND ABDOMEN LIMITED RIGHT UPPER QUADRANT COMPARISON:  10/02/2020 FINDINGS: Gallbladder: No gallstones or wall thickening visualized. No sonographic Murphy sign noted by sonographer. Common bile duct: Diameter: Normal caliber, 2 mm Liver: No focal lesion identified. Within normal limits in parenchymal echogenicity. Portal vein is patent on color Doppler imaging with normal direction of blood flow towards the liver. Other: None. IMPRESSION: Unremarkable right upper quadrant ultrasound. Electronically Signed   By: Rolm Baptise M.D.   On: 08/10/2022 03:45    PROCEDURES and INTERVENTIONS:  Procedures  Medications  lactated ringers bolus 1,000 mL (0 mLs Intravenous Stopped 08/10/22 0314)  ondansetron (ZOFRAN) injection 4 mg (4 mg Intravenous Given 08/10/22 0215)  acetaminophen (TYLENOL) tablet 1,000 mg (1,000 mg Oral Given 08/10/22 0215)  nitrofurantoin (macrocrystal-monohydrate) (MACROBID) capsule 100 mg (100 mg Oral Given 08/10/22 0450)     IMPRESSION / MDM / ASSESSMENT AND PLAN / ED COURSE  I reviewed the triage vital signs and the nursing notes.  Differential diagnosis includes, but is not limited to, cholelithiasis, cholecystitis, miscarriage, 1st trimester N/V, cystitis.  {Patient presents with symptoms of an acute illness or injury that is potentially life-threatening.  27 year old female presents to the ED with abdominal pain and emesis in the setting of first trimester gestation, with a fairly benign workup and suitable for outpatient management.  She looks systemically well and has some mild right-sided abdominal tenderness but no peritoneal features.  Pelvic ultrasound without acute pathology and RUQ ultrasound without evidence of hepatobiliary pathology.  Her blood work shows a normal lipase, normal metabolic panel and no leukocytosis or signs of sepsis.  Urine with asymptomatic bacteriuria  for which she was started on a few days of nitrofurantoin.  Due to her continued cramping pain and mild tenderness on the right side and the possibility of appendicitis, as below, an MRI is obtained and is reassuring.  She is tolerating p.o. and suitable for outpatient management.  Clinical Course as of 08/10/22 0600  Thu Aug 10, 2022  0102 Reassessed.  Patient reports feeling better and is requesting something to eat.  We discussed workup overall.  We discussed reassuring blood work and ultrasounds.  I reexamined her.  She has some minimal lower abdominal tenderness including some mild tenderness to the RLQ, but no guarding or peritoneal features.  Upper abdomen is benign.  We had a fairly lengthy discussion regarding appendicitis and the possibility of this contributing to her symptoms.  We discussed what would look like to assess for appendicitis in the setting of pregnancy.  We discussed 2 options, pursuing this imaging versus conservative management and close return precautions.  She would definitely prefer to not undergo imaging and is asking to eat something so she can leave.  We discussed what worsening appendicitis may look like and appropriate return precautions.  She expresses understanding and agreement. [DS]  0448 Reassessed.  Patient reports that her abdomen is cramping little bit worse.  I reexamined that she still has right-sided tenderness.  We again discussed possibility of appendicitis and I urged her to stay for an MRI.  She is reluctant, but agreeable [DS]  0626 Reassessed and discussed reassuring MRI.  Discussed asymptomatic bacteriuria.  Discussed return precautions [DS]    Clinical Course User Index [DS] Vladimir Crofts, MD     FINAL CLINICAL IMPRESSION(S) / ED DIAGNOSES   Final diagnoses:  Asymptomatic bacteriuria during pregnancy in first trimester  Generalized abdominal pain  Nausea     Rx / DC Orders   ED Discharge Orders          Ordered    nitrofurantoin,  macrocrystal-monohydrate, (MACROBID) 100 MG capsule  2 times daily        08/10/22 0551             Note:  This document was prepared using Dragon voice recognition software and may include unintentional dictation errors.   Vladimir Crofts, MD 08/10/22 [phone]

## 2022-08-30 ENCOUNTER — Encounter: Payer: Self-pay | Admitting: Advanced Practice Midwife

## 2022-08-30 ENCOUNTER — Ambulatory Visit (INDEPENDENT_AMBULATORY_CARE_PROVIDER_SITE_OTHER): Payer: Medicaid Other | Admitting: Advanced Practice Midwife

## 2022-08-30 VITALS — BP 100/60 | Wt 204.0 lb

## 2022-08-30 DIAGNOSIS — Z3A16 16 weeks gestation of pregnancy: Secondary | ICD-10-CM

## 2022-08-30 DIAGNOSIS — Z369 Encounter for antenatal screening, unspecified: Secondary | ICD-10-CM

## 2022-08-30 DIAGNOSIS — Z3482 Encounter for supervision of other normal pregnancy, second trimester: Secondary | ICD-10-CM

## 2022-08-30 DIAGNOSIS — Z3480 Encounter for supervision of other normal pregnancy, unspecified trimester: Secondary | ICD-10-CM

## 2022-08-30 LAB — POCT URINALYSIS DIPSTICK OB
Bilirubin, UA: NEGATIVE
Blood, UA: NEGATIVE
Glucose, UA: NEGATIVE
Ketones, UA: NEGATIVE
Nitrite, UA: NEGATIVE
POC,PROTEIN,UA: NEGATIVE
Spec Grav, UA: 1.01 (ref 1.010–1.025)
Urobilinogen, UA: 1 E.U./dL
pH, UA: 7 (ref 5.0–8.0)

## 2022-08-30 MED ORDER — COMPLETENATE 29-1 MG PO CHEW: 1.0000 | CHEWABLE_TABLET | Freq: Every day | ORAL | 11 refills | Status: AC

## 2022-08-30 NOTE — Progress Notes (Signed)
Routine Prenatal Care Visit  Subjective  Felicia Jones is a 27 y.o. [phone] at 10w3dbeing seen today for ongoing prenatal care.  She is currently monitored for the following issues for this low-risk pregnancy and has Bilateral low back pain without sciatica; Generalized anxiety disorder; Supervision of other normal pregnancy, antepartum; and Herpes simplex of female genitalia on their problem list.  ----------------------------------------------------------------------------------- Patient reports  feeling tired. She is not taking a prenatal currently and is encouraged to take one with iron .  Reordered previously ordered [place]. Contractions: Not present. Vag. Bleeding: None.  Movement: Absent. Leaking Fluid denies.  ----------------------------------------------------------------------------------- The following portions of the patient's history were reviewed and updated as appropriate: allergies, current medications, past family history, past medical history, past social history, past surgical history and problem list. Problem list updated.  Objective  Blood pressure 100/60, weight 204 lb (92.5 kg), last menstrual period 05/12/2022, currently breastfeeding. Pregravid weight 202 lb (91.6 kg) Total Weight Gain 2 lb (0.907 kg) Urinalysis: Urine Protein    Urine Glucose    Fetal Status: Fetal Heart Rate (bpm): 143   Movement: Absent     General:  Alert, oriented and cooperative. Patient is in no acute distress.  Skin: Skin is warm and dry. No rash noted.   Cardiovascular: Normal heart rate noted  Respiratory: Normal respiratory effort, no problems with respiration noted  Abdomen: Soft, gravid, appropriate for gestational age. Pain/Pressure: Absent     Pelvic:  Cervical exam deferred        Extremities: Normal range of motion.  Edema: None  Mental Status: Normal mood and affect. Normal behavior. Normal judgment and thought content.   Assessment   27y.o. [id number] [id number]  02/11/2023, by  Ultrasound presenting for routine prenatal visit  Plan   G4 Problems (from 06/16/22 to present)     Problem Noted Resolved   Supervision of other normal pregnancy, antepartum 07/07/2022 by JoCleophas DunkerCMA No   Overview Addendum 07/07/2022  2:38 PM by JoCleophas DunkerCMDevolataff Provider  Office Location  [facility] Dating  02/16/2023, Date entered prior to episode creation  Language  English Anatomy [place]  Flu Vaccine  declines Genetic Screen  NIPS:   TDaP vaccine   offer Hgb A1C or  GTT Early : Third trimester :   Covid declined   LAB RESULTS   Rhogam     Blood Type     Feeding Plan breast Antibody    Contraception undecided Rubella    Circumcision yes RPR     [facility]    Prenatal Classes no Varicella     GBS  (For PCN allergy, check sensitivities)   BTL Consent  Hep C     VBAC Consent  Pap Diagnosis  Date Value Ref Range Status  06/16/2020   Final   - Negative for intraepithelial lesion or malignancy (NILM)      Hgb Electro      CF      SMA                   Preterm labor symptoms and general obstetric precautions including but not limited to vaginal bleeding, contractions, leaking of fluid and fetal movement were reviewed in detail with the patient. Please refer to After Visit Summary for other counseling recommendations.   Return in about 4 weeks (around 09/27/2022) for anatomy scan and rob  after.  Rod Can, CNM 08/30/2022 11:00 AM

## 2022-09-15 ENCOUNTER — Telehealth: Payer: Self-pay

## 2022-09-15 NOTE — Telephone Encounter (Signed)
Pt called triage with complaint of pelvic cramping that started about 3 weeks ago. She said it's not every day. Today the pain started about 2-3 hours. Has been taking tylenol even though it doesn't touch the pain. She denies vaginal spotting/bleeding, leakage of fluid. Still hasn't been able to feel baby move. Her water intake is not too good, drinks about 2 bottles of water. Advised to drink at least but more than 64 oz of water. Bowel movements are good. She is to go to ER if severe pelvic pain occurs or vag spotting/bleeding or leakage of fluid.

## 2022-09-28 ENCOUNTER — Encounter: Payer: Self-pay | Admitting: Obstetrics and Gynecology

## 2022-09-28 ENCOUNTER — Ambulatory Visit
Admission: RE | Admit: 2022-09-28 | Discharge: 2022-09-28 | Disposition: A | Discharge status: Home / Self Care | Payer: Medicaid Other | Source: Ambulatory Visit | Attending: Advanced Practice Midwife | Admitting: Advanced Practice Midwife

## 2022-09-28 ENCOUNTER — Ambulatory Visit (INDEPENDENT_AMBULATORY_CARE_PROVIDER_SITE_OTHER): Payer: Medicaid Other | Admitting: Obstetrics and Gynecology

## 2022-09-28 VITALS — BP 111/57 | HR 86 | Wt 207.3 lb

## 2022-09-28 DIAGNOSIS — Z3480 Encounter for supervision of other normal pregnancy, unspecified trimester: Secondary | ICD-10-CM | POA: Insufficient documentation

## 2022-09-28 DIAGNOSIS — Z3689 Encounter for other specified antenatal screening: Secondary | ICD-10-CM | POA: Insufficient documentation

## 2022-09-28 DIAGNOSIS — O99212 Obesity complicating pregnancy, second trimester: Secondary | ICD-10-CM

## 2022-09-28 DIAGNOSIS — Z3A20 20 weeks gestation of pregnancy: Secondary | ICD-10-CM | POA: Insufficient documentation

## 2022-09-28 DIAGNOSIS — O9882 Other maternal infectious and parasitic diseases complicating childbirth: Secondary | ICD-10-CM

## 2022-09-28 DIAGNOSIS — Z369 Encounter for antenatal screening, unspecified: Secondary | ICD-10-CM | POA: Diagnosis present

## 2022-09-28 DIAGNOSIS — O99210 Obesity complicating pregnancy, unspecified trimester: Secondary | ICD-10-CM

## 2022-09-28 DIAGNOSIS — B009 Herpesviral infection, unspecified: Secondary | ICD-10-CM

## 2022-09-28 DIAGNOSIS — E669 Obesity, unspecified: Secondary | ICD-10-CM

## 2022-09-28 DIAGNOSIS — N949 Unspecified condition associated with female genital organs and menstrual cycle: Secondary | ICD-10-CM

## 2022-09-28 DIAGNOSIS — Z362 Encounter for other antenatal screening follow-up: Secondary | ICD-10-CM

## 2022-09-28 DIAGNOSIS — O99342 Other mental disorders complicating pregnancy, second trimester: Secondary | ICD-10-CM

## 2022-09-28 DIAGNOSIS — F411 Generalized anxiety disorder: Secondary | ICD-10-CM

## 2022-09-28 NOTE — Progress Notes (Signed)
ROB [redacted]w[redacted]d: She has been doing well. She has little movement because of the anterior placenta. She has no new concerns today.

## 2022-09-28 NOTE — Progress Notes (Signed)
ROB: Patient is a 27 y.o. [id number] at [redacted]w[redacted]d who presents for routine OB care.  Pregnancy is complicated by obesity, HSV, and GAD. Patient has complaints of pain on left side, sharp. Pain occasionally migrates to right side.  Mostly noted in groin region. Advised that this was most likely round ligament pain.  Discussed comfort measures. Normal MaterniT21, AFP declined. Anatomy scan performed today, incomplete for kidney and face views. Notes she has not been able to detect much movement yet, but now knows she has an anterior placenta.  RTC 4 weeks for f/u anatomy.

## 2022-10-19 ENCOUNTER — Ambulatory Visit: Payer: Medicaid Other | Admitting: Licensed Clinical Social Worker

## 2022-10-25 ENCOUNTER — Ambulatory Visit: Payer: Medicaid Other | Admitting: Licensed Clinical Social Worker

## 2022-10-25 ENCOUNTER — Ambulatory Visit (INDEPENDENT_AMBULATORY_CARE_PROVIDER_SITE_OTHER): Payer: Medicaid Other

## 2022-10-25 ENCOUNTER — Ambulatory Visit (INDEPENDENT_AMBULATORY_CARE_PROVIDER_SITE_OTHER): Payer: Medicaid Other | Admitting: Obstetrics and Gynecology

## 2022-10-25 ENCOUNTER — Encounter: Payer: Self-pay | Admitting: Obstetrics and Gynecology

## 2022-10-25 VITALS — BP 100/64 | HR 76 | Wt 214.2 lb

## 2022-10-25 DIAGNOSIS — Z3A24 24 weeks gestation of pregnancy: Secondary | ICD-10-CM

## 2022-10-25 DIAGNOSIS — Z131 Encounter for screening for diabetes mellitus: Secondary | ICD-10-CM

## 2022-10-25 DIAGNOSIS — Z3400 Encounter for supervision of normal first pregnancy, unspecified trimester: Secondary | ICD-10-CM

## 2022-10-25 DIAGNOSIS — Z113 Encounter for screening for infections with a predominantly sexual mode of transmission: Secondary | ICD-10-CM

## 2022-10-25 DIAGNOSIS — Z362 Encounter for other antenatal screening follow-up: Secondary | ICD-10-CM | POA: Diagnosis not present

## 2022-10-25 DIAGNOSIS — Z3482 Encounter for supervision of other normal pregnancy, second trimester: Secondary | ICD-10-CM

## 2022-10-25 LAB — POCT URINALYSIS DIPSTICK OB
Bilirubin, UA: NEGATIVE
Blood, UA: NEGATIVE
Glucose, UA: NEGATIVE
Ketones, UA: NEGATIVE
Nitrite, UA: NEGATIVE
POC,PROTEIN,UA: NEGATIVE
Spec Grav, UA: 1.025 (ref 1.010–1.025)
Urobilinogen, UA: 0.2 E.U./dL
pH, UA: 5 (ref 5.0–8.0)

## 2022-10-25 NOTE — Progress Notes (Unsigned)
Counselor/Therapist Progress Note  Patient ID: Felicia Jones, MRN: [id number],    Date: 10/25/2022  Time Spent: ***   Treatment Type: Psychotherapy  Reported Symptoms: {CHL AMB Reported Symptoms:[phone]}  Mental Status Exam:  Appearance:   {PSY:22683}     Behavior:  {PSY:21022743}  Motor:  {PSY:22302}  Speech/Language:   {PSY:22685}  Affect:  {PSY:22687}  Mood:  {PSY:31886}  Thought process:  {PSY:31888}  Thought content:    {PSY:3657382513}  Sensory/Perceptual disturbances:    {PSY:530-230-8461}  Orientation:  {PSY:30297}  Attention:  {PSY:22877}  Concentration:  {PSY:409-495-6302}  Memory:  {[phone]}  Fund of knowledge:   {PSY:409-495-6302}  Insight:    {PSY:409-495-6302}  Judgment:   {PSY:409-495-6302}  Impulse Control:  {PSY:409-495-6302}   Risk Assessment: Danger to Self:  No Self-injurious Behavior: No Danger to Others: No Duty to Warn:no Physical Aggression / Violence:No  Access to Firearms a concern: No  Gang Involvement:No   Subjective: Patient was engaged and cooperative throughout the session using time effectively to discuss   Patient voices continued motivation for treatment and understanding of  . Patient is likely to benefit from future treatment because  remains motivated to decrease  And   and reports benefit of regular sessions in addressing these symptoms.     Interventions: {PSY:9253351520} Checked in with patient regarding their week. Clinician met with patient to identify needs related to stressors and functioning, and assess and monitor for signs and symptoms of and , and assess safety. The clinician processed with the patient how they have been doing since the last follow-up session. LCSW assisted patient in processing their emotions about what they have experienced in ... and ... with / at .... LCSW reviewed behavior modification, distress tolerance and effective communication skills with patient. Provided support through active listening, validation  of feelings, and highlighted patient's strengths.   Diagnosis:No diagnosis found.  Plan: ***  Future Appointments  Date Time Provider Department Center  10/25/2022  1:00 PM Kathreen Cosier, LCSW [facility] None  11/22/2022  8:20 AM [facility] None  11/22/2022  8:55 AM Mirna Mires, CNM [facility] None      Kathreen Cosier, LCSW

## 2022-10-25 NOTE — Progress Notes (Signed)
ROB: Doing well-has no complaints.  Feels daily fetal movement.  Use of vitamins and aspirin discussed.  Anatomy scan performed today and was completed.  1 hour GCT with next visit.

## 2022-10-25 NOTE — Progress Notes (Signed)
ROB. She states daily fetal movement with some left sided pain. Follow-up anatomy ultrasound today, normal. Patient states no questions or concerns at this time.

## 2022-11-22 ENCOUNTER — Ambulatory Visit (INDEPENDENT_AMBULATORY_CARE_PROVIDER_SITE_OTHER): Payer: Medicaid Other | Admitting: Obstetrics

## 2022-11-22 ENCOUNTER — Other Ambulatory Visit: Payer: Medicaid Other

## 2022-11-22 VITALS — BP 106/44 | HR 87 | Wt 215.0 lb

## 2022-11-22 DIAGNOSIS — Z23 Encounter for immunization: Secondary | ICD-10-CM | POA: Diagnosis not present

## 2022-11-22 DIAGNOSIS — Z3480 Encounter for supervision of other normal pregnancy, unspecified trimester: Secondary | ICD-10-CM

## 2022-11-22 DIAGNOSIS — Z3A28 28 weeks gestation of pregnancy: Secondary | ICD-10-CM

## 2022-11-22 DIAGNOSIS — Z3490 Encounter for supervision of normal pregnancy, unspecified, unspecified trimester: Secondary | ICD-10-CM

## 2022-11-22 DIAGNOSIS — Z131 Encounter for screening for diabetes mellitus: Secondary | ICD-10-CM

## 2022-11-22 DIAGNOSIS — Z3400 Encounter for supervision of normal first pregnancy, unspecified trimester: Secondary | ICD-10-CM

## 2022-11-22 DIAGNOSIS — Z113 Encounter for screening for infections with a predominantly sexual mode of transmission: Secondary | ICD-10-CM

## 2022-11-22 DIAGNOSIS — Z3483 Encounter for supervision of other normal pregnancy, third trimester: Secondary | ICD-10-CM

## 2022-11-22 LAB — POCT URINALYSIS DIPSTICK OB
Bilirubin, UA: NEGATIVE
Blood, UA: 1
Glucose, UA: NEGATIVE
Ketones, UA: NEGATIVE
Nitrite, UA: NEGATIVE
Spec Grav, UA: 1.015 (ref 1.010–1.025)
Urobilinogen, UA: 0.2 E.U./dL
pH, UA: 5 (ref 5.0–8.0)

## 2022-11-22 NOTE — Progress Notes (Signed)
Routine Prenatal Care Visit  Subjective  Felicia Jones is a 27 y.o. [phone] at [redacted]w[redacted]d being seen today for ongoing prenatal care.  She is currently monitored for the following issues for this high-risk pregnancy and has Bilateral low back pain without sciatica; Obesity in pregnancy, antepartum; Generalized anxiety disorder; Supervision of other normal pregnancy, antepartum; and Herpes simplex of female genitalia on their problem list.  ----------------------------------------------------------------------------------- Patient reports  that she feels less fetal activity with this baby. She does have an anterior placenta. Having her 28 wk labs today. .   Contractions: Not present. Vag. Bleeding: None.  Movement: Present. Leaking Fluid denies.  ----------------------------------------------------------------------------------- The following portions of the patient's history were reviewed and updated as appropriate: allergies, current medications, past family history, past medical history, past social history, past surgical history and problem list. Problem list updated.  Objective  Blood pressure (!) 106/44, pulse 87, weight 215 lb (97.5 kg), last menstrual period 05/12/2022, currently breastfeeding. Pregravid weight 202 lb (91.6 kg) Total Weight Gain 13 lb (5.897 kg) Urinalysis: Urine Protein    Urine Glucose    Fetal Status:     Movement: Present     General:  Alert, oriented and cooperative. Patient is in no acute distress.  Skin: Skin is warm and dry. No rash noted.   Cardiovascular: Normal heart rate noted  Respiratory: Normal respiratory effort, no problems with respiration noted  Abdomen: Soft, gravid, appropriate for gestational age. Pain/Pressure: Absent     Pelvic:  Cervical exam deferred        Extremities: Normal range of motion.     Mental Status: Normal mood and affect. Normal behavior. Normal judgment and thought content.   Assessment   27 y.o. [id number] at [redacted]w[redacted]d by  02/11/2023, by  Ultrasound presenting for routine prenatal visit High BMI. S>D  Plan   G4 Problems (from 06/16/22 to present)     Problem Noted Resolved   Supervision of other normal pregnancy, antepartum 07/07/2022 by Loran Senters, CMA No   Overview Addendum 07/07/2022  2:38 PM by Loran Senters, CMA     Clinical Staff Provider  Office Location  [facility] Dating  02/16/2023, Date entered prior to episode creation  Language  English Anatomy US    Flu Vaccine  declines Genetic Screen  NIPS:   TDaP vaccine   offer Hgb A1C or  GTT Early : Third trimester :   Covid declined   LAB RESULTS   Rhogam     Blood Type     Feeding Plan breast Antibody    Contraception undecided Rubella    Circumcision yes RPR     Pediatrician  [facility]     Support Person DaQuan HIV    Prenatal Classes no Varicella     GBS  (For PCN allergy, check sensitivities)   BTL Consent  Hep C     VBAC Consent  Pap Diagnosis  Date Value Ref Range Status  06/16/2020   Final   - Negative for intraepithelial lesion or malignancy (NILM)      Hgb Electro      CF      SMA                   Preterm labor symptoms and general obstetric precautions including but not limited to vaginal bleeding, contractions, leaking of fluid and fetal movement were reviewed in detail with the patient. Please refer to After Visit Summary for other counseling recommendations.   Return in about 2  weeks (around 12/06/2022) for return OB. I have also added a growth scan.  Mirna Mires, CNM  11/22/2022 9:31 AM

## 2022-11-23 ENCOUNTER — Other Ambulatory Visit: Payer: Self-pay

## 2022-11-23 ENCOUNTER — Telehealth: Payer: Self-pay

## 2022-11-23 DIAGNOSIS — O99810 Abnormal glucose complicating pregnancy: Secondary | ICD-10-CM

## 2022-11-23 LAB — 28 WEEK RH+PANEL
Basophils Absolute: 0 10*3/uL (ref 0.0–0.2)
Basos: 0 %
EOS (ABSOLUTE): 0.1 10*3/uL (ref 0.0–0.4)
Eos: 1 %
Gestational Diabetes Screen: 160 mg/dL — ABNORMAL HIGH (ref 70–139)
HIV Screen 4th Generation wRfx: NONREACTIVE
Hematocrit: 32.1 % — ABNORMAL LOW (ref 34.0–46.6)
Hemoglobin: 10.5 g/dL — ABNORMAL LOW (ref 11.1–15.9)
Immature Grans (Abs): 0.1 10*3/uL (ref 0.0–0.1)
Immature Granulocytes: 1 %
Lymphocytes Absolute: 2.4 10*3/uL (ref 0.7–3.1)
Lymphs: 26 %
MCH: 27.2 pg (ref 26.6–33.0)
MCHC: 32.7 g/dL (ref 31.5–35.7)
MCV: 83 fL (ref 79–97)
Monocytes Absolute: 0.6 10*3/uL (ref 0.1–0.9)
Monocytes: 7 %
Neutrophils Absolute: 6.1 10*3/uL (ref 1.4–7.0)
Neutrophils: 65 %
Platelets: 231 10*3/uL (ref 150–450)
RBC: 3.86 x10E6/uL (ref 3.77–5.28)
RDW: 13.7 % (ref 11.7–15.4)
RPR Ser Ql: NONREACTIVE
WBC: 9.3 10*3/uL (ref 3.4–10.8)

## 2022-11-23 NOTE — Telephone Encounter (Signed)
Pt calling after hour nurse 11/22/22 6:37pm; was seen earlier this am and BP was 106/44; reports unknown current BP; feeling lightheaded earlier today. Was adv by after hour nurse to be seen in 2-3 days for BP check.  [phone] states she feels better today; has taken her BP with her mom's machine and it has been fine; pt agrees the lightheadedness was d/t having glucose test and diastolic being low.  Adv pt to call if she needs [place].  Pt appreciative.

## 2022-11-24 ENCOUNTER — Other Ambulatory Visit: Payer: Medicaid Other

## 2022-11-24 DIAGNOSIS — O99810 Abnormal glucose complicating pregnancy: Secondary | ICD-10-CM

## 2022-11-24 LAB — URINE CULTURE

## 2022-11-25 LAB — GESTATIONAL GLUCOSE TOLERANCE
Glucose, Fasting: 93 mg/dL (ref 70–94)
Glucose, GTT - 1 Hour: 148 mg/dL (ref 70–179)
Glucose, GTT - 2 Hour: 108 mg/dL (ref 70–154)
Glucose, GTT - 3 Hour: 87 mg/dL (ref 70–139)

## 2022-12-06 ENCOUNTER — Ambulatory Visit (INDEPENDENT_AMBULATORY_CARE_PROVIDER_SITE_OTHER): Payer: Medicaid Other

## 2022-12-06 ENCOUNTER — Ambulatory Visit (INDEPENDENT_AMBULATORY_CARE_PROVIDER_SITE_OTHER): Payer: Medicaid Other | Admitting: Obstetrics

## 2022-12-06 VITALS — BP 94/56 | HR 84 | Wt 216.0 lb

## 2022-12-06 DIAGNOSIS — Z3480 Encounter for supervision of other normal pregnancy, unspecified trimester: Secondary | ICD-10-CM

## 2022-12-06 DIAGNOSIS — Z362 Encounter for other antenatal screening follow-up: Secondary | ICD-10-CM

## 2022-12-06 DIAGNOSIS — Z3483 Encounter for supervision of other normal pregnancy, third trimester: Secondary | ICD-10-CM

## 2022-12-06 DIAGNOSIS — Z3A31 31 weeks gestation of pregnancy: Secondary | ICD-10-CM | POA: Diagnosis not present

## 2022-12-06 DIAGNOSIS — Z3490 Encounter for supervision of normal pregnancy, unspecified, unspecified trimester: Secondary | ICD-10-CM

## 2022-12-06 DIAGNOSIS — Z3A30 30 weeks gestation of pregnancy: Secondary | ICD-10-CM

## 2022-12-06 DIAGNOSIS — Z3400 Encounter for supervision of normal first pregnancy, unspecified trimester: Secondary | ICD-10-CM

## 2022-12-06 LAB — POCT URINALYSIS DIPSTICK OB
Bilirubin, UA: NEGATIVE
Blood, UA: POSITIVE
Glucose, UA: NEGATIVE
Ketones, UA: NEGATIVE
Nitrite, UA: NEGATIVE
Urobilinogen, UA: 0.2 E.U./dL
pH, UA: 5 (ref 5.0–8.0)

## 2022-12-06 NOTE — Progress Notes (Signed)
Routine Prenatal Care Visit  Subjective  Felicia Jones is a 27 y.o. [phone] at [redacted]w[redacted]d being seen today for ongoing prenatal care.  She is currently monitored for the following issues for this high-risk pregnancy and has Bilateral low back pain without sciatica; Obesity in pregnancy, antepartum; Generalized anxiety disorder; Supervision of other normal pregnancy, antepartum; and Herpes simplex of female genitalia on their problem list.  ----------------------------------------------------------------------------------- Patient reports no complaints.  She had a growth scan today. Growth is WNL.she is feeling more pressure from the weight of the baby. Has a maternity belt and it is not helping. Contractions: Not present. Vag. Bleeding: None.  Movement: Present. Leaking Fluid denies.  ----------------------------------------------------------------------------------- The following portions of the patient's history were reviewed and updated as appropriate: allergies, current medications, past family history, past medical history, past social history, past surgical history and problem list. Problem list updated.  Objective  Blood pressure (!) 94/56, pulse 84, weight 216 lb (98 kg), last menstrual period 05/12/2022, currently breastfeeding. Pregravid weight 202 lb (91.6 kg) Total Weight Gain 14 lb (6.35 kg) Urinalysis: Urine Protein Trace  Urine Glucose Negative  Fetal Status: Fetal Heart Rate (bpm): 140s Fundal Height: 33 cm Movement: Present     General:  Alert, oriented and cooperative. Patient is in no acute distress.  Skin: Skin is warm and dry. No rash noted.   Cardiovascular: Normal heart rate noted  Respiratory: Normal respiratory effort, no problems with respiration noted  Abdomen: Soft, gravid, appropriate for gestational age. Pain/Pressure: Present     Pelvic:  Cervical exam deferred        Extremities: Normal range of motion.  Edema: None  Mental Status: Normal mood and affect. Normal  behavior. Normal judgment and thought content.   Assessment   27 y.o. A5W0981 at [redacted]w[redacted]d by  02/11/2023, by Ultrasound presenting for routine prenatal visit High BMI Growth scan shows WNL EFW  Plan   G4 Problems (from 06/16/22 to present)     Problem Noted Resolved   Supervision of other normal pregnancy, antepartum 07/07/2022 by Loran Senters, CMA No   Overview Addendum 12/06/2022  4:17 PM by Mirna Mires, CNM     Clinical Staff Provider  Office Location  [facility] Dating  02/16/2023, Date entered prior to episode creation  Language  English Anatomy US  female  Flu Vaccine  declines Genetic Screen  NIPS:   TDaP vaccine   11/22/22 Hgb A1C or  GTT Early : Third trimester : 160; passed 3 hr GTT  Covid declined   LAB RESULTS   Rhogam  B/Positive/-- (01/26 1013)  Blood Type B/Positive/-- (01/26 1013)   Feeding Plan breast Antibody Negative (01/26 1013)  Contraception undecided Rubella 1.72 (01/26 1013)  Circumcision yes RPR Non Reactive (01/26 1013)   Pediatrician  [facility] Negative (01/26 1013)   Support Person DaQuan HIV Non Reactive (01/26 1013)  Prenatal Classes no Varicella immune    GBS  (For PCN allergy, check sensitivities)   BTL Consent  Hep C Non Reactive (01/26 1013)   VBAC Consent  Pap Diagnosis  Date Value Ref Range Status  06/16/2020   Final   - Negative for intraepithelial lesion or malignancy (NILM)      Hgb Electro      CF      SMA                   Preterm labor symptoms and general obstetric precautions including but not limited to vaginal bleeding, contractions,  leaking of fluid and fetal movement were reviewed in detail with the patient. Please refer to After Visit Summary for other counseling recommendations.   Return in about 2 weeks (around 12/20/2022) for return OB.  Mirna Mires, CNM  12/06/2022 4:17 PM

## 2022-12-20 ENCOUNTER — Ambulatory Visit (INDEPENDENT_AMBULATORY_CARE_PROVIDER_SITE_OTHER): Payer: Medicaid Other | Admitting: Obstetrics

## 2022-12-20 VITALS — BP 97/61 | HR 97 | Wt 218.0 lb

## 2022-12-20 DIAGNOSIS — Z3A32 32 weeks gestation of pregnancy: Secondary | ICD-10-CM

## 2022-12-20 DIAGNOSIS — Z3400 Encounter for supervision of normal first pregnancy, unspecified trimester: Secondary | ICD-10-CM

## 2022-12-20 DIAGNOSIS — Z3403 Encounter for supervision of normal first pregnancy, third trimester: Secondary | ICD-10-CM

## 2022-12-20 LAB — POCT URINALYSIS DIPSTICK OB
Bilirubin, UA: NEGATIVE
Blood, UA: NEGATIVE
Glucose, UA: NEGATIVE
Ketones, UA: NEGATIVE
Nitrite, UA: NEGATIVE
POC,PROTEIN,UA: NEGATIVE
Spec Grav, UA: 1.015 (ref 1.010–1.025)
Urobilinogen, UA: 0.2 E.U./dL
pH, UA: 7.5 (ref 5.0–8.0)

## 2022-12-20 NOTE — Progress Notes (Signed)
Routine Prenatal Care Visit  Subjective  Felicia Jones is a 27 y.o. [phone] at [redacted]w[redacted]d being seen today for ongoing prenatal care.  She is currently monitored for the following issues for this high-risk pregnancy and has Bilateral low back pain without sciatica; Obesity in pregnancy, antepartum; Generalized anxiety disorder; Supervision of other normal pregnancy, antepartum; and Herpes simplex of female genitalia on their problem list.  ----------------------------------------------------------------------------------- Patient reports no complaints.  Her baby is moving well. She did not set up anesthesia visit yet. Contractions: Not present. Vag. Bleeding: None.  Movement: Present. Leaking Fluid denies.  ----------------------------------------------------------------------------------- The following portions of the patient's history were reviewed and updated as appropriate: allergies, current medications, past family history, past medical history, past social history, past surgical history and problem list. Problem list updated.  Objective  Blood pressure 97/61, pulse 97, weight 218 lb (98.9 kg), last menstrual period 05/12/2022, currently breastfeeding. Pregravid weight 202 lb (91.6 kg) Total Weight Gain 16 lb (7.258 kg) Urinalysis: Urine Protein    Urine Glucose    Fetal Status:     Movement: Present     General:  Alert, oriented and cooperative. Patient is in no acute distress.  Skin: Skin is warm and dry. No rash noted.   Cardiovascular: Normal heart rate noted  Respiratory: Normal respiratory effort, no problems with respiration noted  Abdomen: Soft, gravid, appropriate for gestational age. Pain/Pressure: Present     Pelvic:  Cervical exam deferred        Extremities: Normal range of motion.     Mental Status: Normal mood and affect. Normal behavior. Normal judgment and thought content.   Assessment   27 y.o. A5W0981 at [redacted]w[redacted]d by  02/11/2023, by Ultrasound presenting for routine prenatal  visit Higher BMI- needs anesthesia consultation Recent growth scan shows growth at 66 %.  Plan   G4 Problems (from 06/16/22 to present)     Problem Noted Resolved   Supervision of other normal pregnancy, antepartum 07/07/2022 by Loran Senters, CMA No   Overview Addendum 12/06/2022  4:17 PM by Mirna Mires, CNM     Clinical Staff Provider  Office Location  [facility] Dating  02/16/2023, Date entered prior to episode creation  Language  English Anatomy US  female  Flu Vaccine  declines Genetic Screen  NIPS:   TDaP vaccine   11/22/22 Hgb A1C or  GTT Early : Third trimester : 160; passed 3 hr GTT  Covid declined   LAB RESULTS   Rhogam  B/Positive/-- (01/26 1013)  Blood Type B/Positive/-- (01/26 1013)   Feeding Plan breast Antibody Negative (01/26 1013)  Contraception undecided Rubella 1.72 (01/26 1013)  Circumcision yes RPR Non Reactive (01/26 1013)   Pediatrician  [facility] Negative (01/26 1013)   Support Person DaQuan HIV Non Reactive (01/26 1013)  Prenatal Classes no Varicella immune    GBS  (For PCN allergy, check sensitivities)   BTL Consent  Hep C Non Reactive (01/26 1013)   VBAC Consent  Pap Diagnosis  Date Value Ref Range Status  06/16/2020   Final   - Negative for intraepithelial lesion or malignancy (NILM)      Hgb Electro      CF      SMA                   Preterm labor symptoms and general obstetric precautions including but not limited to vaginal bleeding, contractions, leaking of fluid and fetal movement were reviewed in detail with the patient.  Please refer to After Visit Summary for other counseling recommendations.  Anesthesia consult ordered again.  Return in about 2 weeks (around 01/03/2023) for return OB.  Mirna Mires, CNM  12/20/2022 2:17 PM

## 2023-01-02 ENCOUNTER — Ambulatory Visit (INDEPENDENT_AMBULATORY_CARE_PROVIDER_SITE_OTHER): Payer: Medicaid Other | Admitting: Licensed Practical Nurse

## 2023-01-02 VITALS — BP 105/56 | HR 85 | Wt 218.7 lb

## 2023-01-02 DIAGNOSIS — Z3A34 34 weeks gestation of pregnancy: Secondary | ICD-10-CM

## 2023-01-02 DIAGNOSIS — O99213 Obesity complicating pregnancy, third trimester: Secondary | ICD-10-CM

## 2023-01-02 DIAGNOSIS — Z6841 Body Mass Index (BMI) 40.0 and over, adult: Secondary | ICD-10-CM

## 2023-01-02 DIAGNOSIS — Z3403 Encounter for supervision of normal first pregnancy, third trimester: Secondary | ICD-10-CM

## 2023-01-02 DIAGNOSIS — Z3400 Encounter for supervision of normal first pregnancy, unspecified trimester: Secondary | ICD-10-CM

## 2023-01-02 LAB — POCT URINALYSIS DIPSTICK
Bilirubin, UA: NEGATIVE
Blood, UA: NEGATIVE
Glucose, UA: NEGATIVE
Ketones, UA: NEGATIVE
Leukocytes, UA: NEGATIVE
Nitrite, UA: NEGATIVE
Protein, UA: NEGATIVE
Spec Grav, UA: 1.02 (ref 1.010–1.025)
Urobilinogen, UA: 0.2 E.U./dL
pH, UA: 7 (ref 5.0–8.0)

## 2023-01-03 ENCOUNTER — Encounter: Payer: Self-pay | Admitting: Licensed Practical Nurse

## 2023-01-03 NOTE — Progress Notes (Signed)
Routine Prenatal Care Visit  Subjective  Felicia Jones is a 27 y.o. [phone] at [redacted]w[redacted]d being seen today for ongoing prenatal care.  She is currently monitored for the following issues for this low-risk pregnancy and has Bilateral low back pain without sciatica; Obesity in pregnancy, antepartum; Generalized anxiety disorder; Supervision of other normal pregnancy, antepartum; and Herpes simplex of female genitalia on their problem list.  ----------------------------------------------------------------------------------- Patient reports  B-H contractions, cramping denies urinary symptoms   had sex a few days ago.  Back pain that is throbbing-occurs ar random times-massage does help, comfort measures reviewed, recommend supporting the uterus with a support belt or sheet.  Vivi Barrack to a 73 and 25 year old  -she has family nearby for PP support  -reviewed GBS screening at next visit  -TWG 16lbs, now BMI 42, will start NST next visit   Contractions: Irritability. Vag. Bleeding: None.  Movement: Present. Leaking Fluid denies.  ----------------------------------------------------------------------------------- The following portions of the patient's history were reviewed and updated as appropriate: allergies, current medications, past family history, past medical history, past social history, past surgical history and problem list. Problem list updated.  Objective  Blood pressure (!) 105/56, pulse 85, weight 218 lb 11.2 oz (99.2 kg), last menstrual period 05/12/2022, currently breastfeeding. Pregravid weight 202 lb (91.6 kg) Total Weight Gain 16 lb 11.2 oz (7.575 kg) Urinalysis: Urine Protein    Urine Glucose    Fetal Status: Fetal Heart Rate (bpm): 120   Movement: Present     General:  Alert, oriented and cooperative. Patient is in no acute distress.  Skin: Skin is warm and dry. No rash noted.   Cardiovascular: Normal heart rate noted  Respiratory: Normal respiratory effort, no problems with respiration  noted  Abdomen: Soft, gravid, appropriate for gestational age. Pain/Pressure: Present     Pelvic:  Cervical exam deferred        Extremities: Normal range of motion.  Edema: None  Mental Status: Normal mood and affect. Normal behavior. Normal judgment and thought content.   Assessment   27 y.o. A5W0981 at [redacted]w[redacted]d by  02/11/2023, by Ultrasound presenting for routine prenatal visit  Plan   G4 Problems (from 06/16/22 to present)     Problem Noted Resolved   Supervision of other normal pregnancy, antepartum 07/07/2022 by Loran Senters, CMA No   Overview Addendum 12/06/2022  4:17 PM by Mirna Mires, CNM     Clinical Staff Provider  Office Location  [facility] Dating  02/16/2023, Date entered prior to episode creation  Language  English Anatomy US  female  Flu Vaccine  declines Genetic Screen  NIPS:   TDaP vaccine   11/22/22 Hgb A1C or  GTT Early : Third trimester : 160; passed 3 hr GTT  Covid declined   LAB RESULTS   Rhogam  B/Positive/-- (01/26 1013)  Blood Type B/Positive/-- (01/26 1013)   Feeding Plan breast Antibody Negative (01/26 1013)  Contraception undecided Rubella 1.72 (01/26 1013)  Circumcision yes RPR Non Reactive (01/26 1013)   Pediatrician  [facility] Negative (01/26 1013)   Support Person Felicia Jones HIV Non Reactive (01/26 1013)  Prenatal Classes no Varicella immune    GBS  (For PCN allergy, check sensitivities)   BTL Consent  Hep C Non Reactive (01/26 1013)   VBAC Consent  Pap Diagnosis  Date Value Ref Range Status  06/16/2020   Final   - Negative for intraepithelial lesion or malignancy (NILM)      Hgb Electro  CF      SMA                   Preterm labor symptoms and general obstetric precautions including but not limited to vaginal bleeding, contractions, leaking of fluid and fetal movement were reviewed in detail with the patient. Please refer to After Visit Summary for other counseling recommendations.   Return in about 2 weeks (around 01/16/2023)  for ROB, 36 wk labs, NST . Growth [place] ordered   Carie Caddy, [employer]   [facility]  01/03/23  5:31 PM

## 2023-01-08 NOTE — Addendum Note (Signed)
Addended by: Kathlene Cote on: 01/08/2023 08:54 AM   Modules accepted: Orders

## 2023-01-17 ENCOUNTER — Other Ambulatory Visit (HOSPITAL_COMMUNITY)
Admission: RE | Admit: 2023-01-17 | Discharge: 2023-01-17 | Disposition: A | Discharge status: Home / Self Care | Payer: Medicaid Other | Source: Ambulatory Visit | Attending: Certified Nurse Midwife | Admitting: Certified Nurse Midwife

## 2023-01-17 ENCOUNTER — Ambulatory Visit (INDEPENDENT_AMBULATORY_CARE_PROVIDER_SITE_OTHER): Payer: Medicaid Other

## 2023-01-17 ENCOUNTER — Other Ambulatory Visit: Payer: Self-pay | Admitting: Certified Nurse Midwife

## 2023-01-17 ENCOUNTER — Ambulatory Visit (INDEPENDENT_AMBULATORY_CARE_PROVIDER_SITE_OTHER): Payer: Medicaid Other | Admitting: Certified Nurse Midwife

## 2023-01-17 ENCOUNTER — Encounter: Payer: Self-pay | Admitting: Certified Nurse Midwife

## 2023-01-17 VITALS — BP 112/71 | HR 79 | Wt 223.8 lb

## 2023-01-17 DIAGNOSIS — O99213 Obesity complicating pregnancy, third trimester: Secondary | ICD-10-CM | POA: Diagnosis not present

## 2023-01-17 DIAGNOSIS — E669 Obesity, unspecified: Secondary | ICD-10-CM | POA: Diagnosis not present

## 2023-01-17 DIAGNOSIS — Z3A36 36 weeks gestation of pregnancy: Secondary | ICD-10-CM | POA: Diagnosis not present

## 2023-01-17 DIAGNOSIS — Z113 Encounter for screening for infections with a predominantly sexual mode of transmission: Secondary | ICD-10-CM

## 2023-01-17 DIAGNOSIS — Z3685 Encounter for antenatal screening for Streptococcus B: Secondary | ICD-10-CM

## 2023-01-17 DIAGNOSIS — Z3483 Encounter for supervision of other normal pregnancy, third trimester: Secondary | ICD-10-CM

## 2023-01-17 DIAGNOSIS — Z3482 Encounter for supervision of other normal pregnancy, second trimester: Secondary | ICD-10-CM

## 2023-01-17 LAB — POCT URINALYSIS DIPSTICK OB
Bilirubin, UA: NEGATIVE
Blood, UA: NEGATIVE
Glucose, UA: NEGATIVE
Ketones, UA: NEGATIVE
Nitrite, UA: NEGATIVE
POC,PROTEIN,UA: NEGATIVE
Spec Grav, UA: 1.015 (ref 1.010–1.025)
Urobilinogen, UA: 0.2 E.U./dL
pH, UA: 5 (ref 5.0–8.0)

## 2023-01-17 NOTE — Patient Instructions (Signed)

## 2023-01-17 NOTE — Progress Notes (Signed)
Body mass index is 43.71 kg/m.  ROB , u/s for growth and BPP today. Bpp 8/8.   Growth 66.2%tile AC 84.4%tile AFI 12.05   Discussed induction at 40 wks due to BMI, she is wanting a natural labor and declines induction at this time. She may reconsider but will let us know.   GBS and cultures collected today. PT requesting SVE. Discussed waiting until next visit ( at term) . She verbalizes and agrees.   Follow up 1 wk for NST and ROB  Doreene Burke, CNM

## 2023-01-18 LAB — CERVICOVAGINAL ANCILLARY ONLY
Chlamydia: NEGATIVE
Comment: NEGATIVE
Comment: NORMAL
Neisseria Gonorrhea: NEGATIVE

## 2023-01-20 LAB — CULTURE, BETA STREP (GROUP B ONLY): Strep Gp B Culture: POSITIVE — AB

## 2023-01-22 DIAGNOSIS — Z3A37 37 weeks gestation of pregnancy: Secondary | ICD-10-CM | POA: Insufficient documentation

## 2023-01-22 NOTE — Patient Instructions (Signed)

## 2023-01-22 NOTE — Progress Notes (Signed)
    NURSE VISIT NOTE  Subjective:    Patient ID: Felicia Jones, female    DOB: 1995/12/03, 27 y.o.   MRN: [id number]  HPI  Patient is a 27 y.o. [phone] female who presents for fetal monitoring per order from Doreene Burke, [place].   Objective:    BP 120/75   Pulse 86   Ht 5' (1.524 m)   Wt 224 lb 4.8 oz (101.7 kg)   LMP 05/12/2022 (Approximate)   BMI 43.81 kg/m  Estimated Date of Delivery: 02/11/23  [redacted]w[redacted]d  Fetus A Non-Stress Test Interpretation for 01/23/23  Indication: Obesity  Fetal Heart Rate A Mode: External Baseline Rate (A): 145 bpm (145-150) Variability: Moderate Accelerations: 10 x 10, 15 x 15 (1-15x15, multiple 10x10) Decelerations: Variable Multiple birth?: No  Uterine Activity Mode: Toco Contraction Frequency (min): None  Interpretation (Fetal Testing) Nonstress Test Interpretation: Reactive Overall Impression: Reassuring for gestational age Comments: Reviewed by Dr. Valentino Saxon   Assessment:   1. Obesity affecting pregnancy in third trimester, unspecified obesity type   2. [redacted] weeks gestation of pregnancy      Plan:   Results reviewed and discussed with patient by  Doreene Burke, CNM.     Rocco Serene, LPN

## 2023-01-23 ENCOUNTER — Encounter: Payer: Self-pay | Admitting: Certified Nurse Midwife

## 2023-01-23 ENCOUNTER — Ambulatory Visit (INDEPENDENT_AMBULATORY_CARE_PROVIDER_SITE_OTHER): Payer: Medicaid Other | Admitting: Certified Nurse Midwife

## 2023-01-23 ENCOUNTER — Ambulatory Visit (INDEPENDENT_AMBULATORY_CARE_PROVIDER_SITE_OTHER): Payer: Medicaid Other

## 2023-01-23 VITALS — BP 120/75 | HR 86 | Wt 224.3 lb

## 2023-01-23 VITALS — BP 120/75 | HR 86 | Ht 60.0 in | Wt 224.3 lb

## 2023-01-23 DIAGNOSIS — Z3A37 37 weeks gestation of pregnancy: Secondary | ICD-10-CM

## 2023-01-23 DIAGNOSIS — E669 Obesity, unspecified: Secondary | ICD-10-CM | POA: Diagnosis not present

## 2023-01-23 DIAGNOSIS — O99213 Obesity complicating pregnancy, third trimester: Secondary | ICD-10-CM | POA: Diagnosis not present

## 2023-01-23 LAB — POCT URINALYSIS DIPSTICK OB
Bilirubin, UA: NEGATIVE
Blood, UA: NEGATIVE
Glucose, UA: NEGATIVE
Ketones, UA: NEGATIVE
Leukocytes, UA: NEGATIVE
Nitrite, UA: NEGATIVE
POC,PROTEIN,UA: NEGATIVE
Spec Grav, UA: 1.01 (ref 1.010–1.025)
Urobilinogen, UA: 0.2 E.U./dL
pH, UA: 6.5 (ref 5.0–8.0)

## 2023-01-23 NOTE — Patient Instructions (Signed)

## 2023-01-23 NOTE — Progress Notes (Signed)
ROB and NST  for obesity.   NST with one 15 x 15 and multiple 10 x 10 accelerations noted after 40 min. Strip reviewed with Dr. Valentino Saxon.    see nurse note for full documentation.  Discussed importance of kick counts with pt.   SVE per pt request 2.5/50/-3 station  Return in 1 wks or prn for ROB and NST.   Doreene Burke, CNM

## 2023-01-26 DIAGNOSIS — Z3A38 38 weeks gestation of pregnancy: Secondary | ICD-10-CM | POA: Insufficient documentation

## 2023-01-26 NOTE — Progress Notes (Signed)
    NURSE VISIT NOTE  Subjective:    Patient ID: Felicia Jones, female    DOB: [birth year], 27 y.o.   MRN: [id number]  HPI  Patient is a 27 y.o. [phone] female who presents for fetal monitoring per order from Doreene Burke, [place].   Objective:    BP 108/75   Pulse 89   Ht 5' (1.524 m)   Wt 228 lb 3.2 oz (103.5 kg)   LMP 05/12/2022 (Approximate)   BMI 44.57 kg/m  Estimated Date of Delivery: 02/11/23  [redacted]w[redacted]d  Fetus A Non-Stress Test Interpretation for 02/01/23  Indication: Obesity  Fetal Heart Rate A Mode: External Baseline Rate (A): 145 bpm Variability: Moderate Accelerations: 10 x 10, 15 x 15 (4 10x10, 1 15x15) Decelerations: None Multiple birth?: No  Uterine Activity Mode: Toco Contraction Frequency (min): x1 Contraction Duration (sec): 40 Contraction Quality: Mild Resting Time: Adequate  Interpretation (Fetal Testing) Nonstress Test Interpretation: Reactive Overall Impression: Reassuring for gestational age Comments: Reviewed by Dr. Valentino Saxon   Assessment:   1. Obesity affecting pregnancy in third trimester, unspecified obesity type   2. [redacted] weeks gestation of pregnancy      Plan:   Results reviewed and discussed with patient by  Doreene Burke, CNM.     Rocco Serene, LPN

## 2023-01-26 NOTE — Patient Instructions (Signed)

## 2023-01-29 ENCOUNTER — Encounter: Payer: Self-pay | Admitting: Certified Nurse Midwife

## 2023-01-30 ENCOUNTER — Ambulatory Visit (INDEPENDENT_AMBULATORY_CARE_PROVIDER_SITE_OTHER): Payer: Medicaid Other

## 2023-01-30 ENCOUNTER — Ambulatory Visit: Payer: Medicaid Other | Admitting: Certified Nurse Midwife

## 2023-01-30 ENCOUNTER — Encounter: Payer: Self-pay | Admitting: Certified Nurse Midwife

## 2023-01-30 VITALS — BP 108/75 | HR 89 | Wt 228.2 lb

## 2023-01-30 VITALS — BP 108/75 | HR 89 | Ht 60.0 in | Wt 228.2 lb

## 2023-01-30 DIAGNOSIS — E669 Obesity, unspecified: Secondary | ICD-10-CM

## 2023-01-30 DIAGNOSIS — O99213 Obesity complicating pregnancy, third trimester: Secondary | ICD-10-CM

## 2023-01-30 DIAGNOSIS — Z3A38 38 weeks gestation of pregnancy: Secondary | ICD-10-CM | POA: Diagnosis not present

## 2023-01-30 LAB — POCT URINALYSIS DIPSTICK OB
Bilirubin, UA: NEGATIVE
Blood, UA: NEGATIVE
Glucose, UA: NEGATIVE
Ketones, UA: NEGATIVE
Nitrite, UA: NEGATIVE
POC,PROTEIN,UA: NEGATIVE
Spec Grav, UA: 1.01 (ref 1.010–1.025)
Urobilinogen, UA: 0.2 E.U./dL
pH, UA: 6.5 (ref 5.0–8.0)

## 2023-01-30 NOTE — Progress Notes (Addendum)
ROB and NST.   NST with one 15 x 15   4 10x10 accelerations. Dr. Valentino Saxon consulted.   BPP ordered for later this week. Kick counts reviewed.   SVE per pt request 3/50/-2   ROB & NST 1 wk, BPP later this week. ( Pt unable to schedule NST on Friday due to child care issues, there are not other available out patient appointments). Discussed with Dr. Valentino Saxon. Pt to return for NST later this week (Friday) and has BPP scheduled on Monday 8/5.   Doreene Burke, CNM

## 2023-01-30 NOTE — Patient Instructions (Signed)

## 2023-01-31 NOTE — Progress Notes (Signed)
    NURSE VISIT NOTE  Subjective:    Patient ID: Felicia Jones, female    DOB: 03-21-1996, 27 y.o.   MRN: [id number]  HPI  Patient is a 27 y.o. [phone] female who presents for fetal monitoring per order from Doreene Burke, [place]. Patient reports she was able to get her BPP rescheduled from Monday to this afternoon at 2 pm. She would like to be scheduled for Induction prior to her next visit.  Objective:    BP 120/71   Pulse 96   Ht 5' (1.524 m)   Wt 228 lb 12.8 oz (103.8 kg)   LMP 05/12/2022 (Approximate)   BMI 44.68 kg/m  Estimated Date of Delivery: 02/11/23  [redacted]w[redacted]d  Fetus A Non-Stress Test Interpretation for 02/02/23  Indication: Obesity  Fetal Heart Rate A Mode: External Baseline Rate (A): 140 bpm Variability: Moderate Accelerations: 15 x 15 Decelerations: None Multiple birth?: No  Uterine Activity Mode: Toco Contraction Frequency (min): x1 Contraction Duration (sec): 50 Contraction Quality: Mild Resting Time: Adequate  Interpretation (Fetal Testing) Nonstress Test Interpretation: Reactive Overall Impression: Reassuring for gestational age   Assessment:   1. Obesity affecting pregnancy in third trimester, unspecified obesity type   2. [redacted] weeks gestation of pregnancy      Plan:   Results reviewed by Guadlupe Spanish, CNM and discussed with patient.     Rocco Serene, LPN

## 2023-01-31 NOTE — Patient Instructions (Signed)

## 2023-02-01 ENCOUNTER — Encounter: Payer: Self-pay | Admitting: Certified Nurse Midwife

## 2023-02-02 ENCOUNTER — Encounter: Payer: Self-pay | Admitting: Certified Nurse Midwife

## 2023-02-02 ENCOUNTER — Ambulatory Visit
Admission: RE | Admit: 2023-02-02 | Discharge: 2023-02-02 | Disposition: A | Discharge status: Home / Self Care | Payer: Medicaid Other | Source: Ambulatory Visit | Attending: Certified Nurse Midwife | Admitting: Certified Nurse Midwife

## 2023-02-02 ENCOUNTER — Ambulatory Visit (INDEPENDENT_AMBULATORY_CARE_PROVIDER_SITE_OTHER): Payer: Medicaid Other

## 2023-02-02 VITALS — BP 120/71 | HR 96 | Ht 60.0 in | Wt 228.8 lb

## 2023-02-02 DIAGNOSIS — O99213 Obesity complicating pregnancy, third trimester: Secondary | ICD-10-CM | POA: Insufficient documentation

## 2023-02-02 DIAGNOSIS — Z3A38 38 weeks gestation of pregnancy: Secondary | ICD-10-CM | POA: Insufficient documentation

## 2023-02-02 DIAGNOSIS — E669 Obesity, unspecified: Secondary | ICD-10-CM

## 2023-02-02 NOTE — Telephone Encounter (Addendum)
Patient came for NST. She had a little better variability today and several good 15x15+. She was able to get her BPP r/s from Monday to this pm at 2. She would like to get scheduled for induction instead of having to wait til her visit next week on 8/7. Sending message to ultrasound requesting they contact us with BPP result.

## 2023-02-03 ENCOUNTER — Encounter: Payer: Self-pay | Admitting: Certified Nurse Midwife

## 2023-02-05 ENCOUNTER — Ambulatory Visit: Payer: Medicaid Other

## 2023-02-05 ENCOUNTER — Encounter: Payer: Self-pay | Admitting: Obstetrics

## 2023-02-07 ENCOUNTER — Encounter: Payer: Self-pay | Admitting: Obstetrics and Gynecology

## 2023-02-07 ENCOUNTER — Ambulatory Visit (INDEPENDENT_AMBULATORY_CARE_PROVIDER_SITE_OTHER): Payer: Medicaid Other

## 2023-02-07 ENCOUNTER — Ambulatory Visit (INDEPENDENT_AMBULATORY_CARE_PROVIDER_SITE_OTHER): Payer: Medicaid Other | Admitting: Obstetrics and Gynecology

## 2023-02-07 VITALS — BP 106/64 | HR 89 | Wt 232.8 lb

## 2023-02-07 DIAGNOSIS — O99213 Obesity complicating pregnancy, third trimester: Secondary | ICD-10-CM

## 2023-02-07 DIAGNOSIS — E669 Obesity, unspecified: Secondary | ICD-10-CM

## 2023-02-07 DIAGNOSIS — Z3A39 39 weeks gestation of pregnancy: Secondary | ICD-10-CM

## 2023-02-07 DIAGNOSIS — Z3490 Encounter for supervision of normal pregnancy, unspecified, unspecified trimester: Secondary | ICD-10-CM

## 2023-02-07 DIAGNOSIS — Z3480 Encounter for supervision of other normal pregnancy, unspecified trimester: Secondary | ICD-10-CM

## 2023-02-07 LAB — POCT URINALYSIS DIPSTICK OB
Bilirubin, UA: NEGATIVE
Blood, UA: NEGATIVE
Glucose, UA: NEGATIVE
Ketones, UA: NEGATIVE
Nitrite, UA: NEGATIVE
Spec Grav, UA: 1.01 (ref 1.010–1.025)
Urobilinogen, UA: 0.2 E.U./dL
pH, UA: 6.5 (ref 5.0–8.0)

## 2023-02-07 NOTE — Progress Notes (Signed)
    NURSE VISIT NOTE  Subjective:    Patient ID: Felicia Jones, female    DOB: 05-Apr-1996, 27 y.o.   MRN: [id number]  HPI  Patient is a 27 y.o. [phone] female who presents for fetal monitoring per order from Doreene Burke, [place].   Objective:    LMP 05/12/2022 (Approximate)  Estimated Date of Delivery: 02/11/23  [redacted]w[redacted]d  Fetus A Non-Stress Test Interpretation for 02/07/23  Indication: Obesity            Assessment:   1. Obesity affecting pregnancy in third trimester, unspecified obesity type      Plan:   Results reviewed and discussed with patient by  Hildred Laser, MD.     Santiago Bumpers, CMA [facility]

## 2023-02-07 NOTE — Progress Notes (Signed)
ROB [redacted]w[redacted]d: She is doing well. She reports good fetal movement. She had NST done today. She has no new concerns.

## 2023-02-07 NOTE — Patient Instructions (Signed)

## 2023-02-07 NOTE — Patient Instructions (Addendum)
  Instructions for Scheduled Procedure (Inductions/C-sections and GYN surgeries)   Thank you for choosing [facility] for your services.  You have been scheduled for a procedure called ______Induction of Labor_________  Your procedure is scheduled on ____8/06/2023 at midnight (come in the night before at 11:55 pm)______.  Upon your scheduled procedure date, you will need to arrive at the Emergency Room entrance. (There is a statue at the front of this entrance.)   Please arrive on time if you are scheduled for an induction of labor.   If you are scheduled for a Cesarean delivery or for Gyn Surgery, arrive 2 hours prior to your procedure time.  If you are an Obstetric patient please arrive no more than 10 minutes before your scheduled procedure.  A staff member will meet you at the [facility] entrance.  Please remember that if you are an elective induction (scheduled without a medical reason), there is a possibility that your induction may be moved to accommodate more critical patients.   At this time, patients are allowed 2 support persons to accompany them. Your support person(s) are now allowed to be there with you during the entire time of your admission.    Please contact the office if you have any questions regarding this information.  The [facility] office number is [phone].     Thank you,    Your [facility] Providers

## 2023-02-07 NOTE — Progress Notes (Signed)
ROB: Patient is a 27 y.o. [id number] at [redacted]w[redacted]d who presents for routine OB care.  Pregnancy is complicated by Bilateral low back pain without sciatica; Obesity affecting pregnancy in third trimester; Generalized anxiety disorder; Supervision of other normal pregnancy, antepartum; Herpes simplex of female genitalia. Patient has complaints of feeling some contractions.  Is to be scheduled for IOL for obesity this week.  Scheduled for IOL on 8/12 at midnight. Discussed process of induction. All questions answered.  NST performed today was reviewed and was found to be reactive.  Continue recommended antenatal testing and prenatal care.    NONSTRESS TEST INTERPRETATION  INDICATIONS: Obesity  FHR baseline: 145 bpm Accelerations: present Decelerations: Present - 2 variables associated with contractions  Variability: moderate in degree  Tocometry: Contractions present q 7-8 minutes, also uterine irritability  RESULTS:Reactive COMMENTS: Overall reactive tracing   PLAN: 1. Continue fetal kick counts twice a day. 2. Scheduled for IOL on 8/12 at midnight.

## 2023-02-08 ENCOUNTER — Encounter: Payer: Self-pay | Admitting: Obstetrics and Gynecology

## 2023-02-10 ENCOUNTER — Inpatient Hospital Stay
Admission: RE | Admit: 2023-02-10 | Discharge: 2023-02-12 | DRG: 806 | Disposition: A | Discharge status: Home / Self Care | Payer: Medicaid Other

## 2023-02-10 ENCOUNTER — Other Ambulatory Visit: Payer: Self-pay

## 2023-02-10 ENCOUNTER — Encounter: Payer: Self-pay | Admitting: Obstetrics and Gynecology

## 2023-02-10 DIAGNOSIS — E669 Obesity, unspecified: Secondary | ICD-10-CM

## 2023-02-10 DIAGNOSIS — O99214 Obesity complicating childbirth: Principal | ICD-10-CM

## 2023-02-10 DIAGNOSIS — O98313 Other infections with a predominantly sexual mode of transmission complicating pregnancy, third trimester: Secondary | ICD-10-CM

## 2023-02-10 DIAGNOSIS — O99820 Streptococcus B carrier state complicating pregnancy: Secondary | ICD-10-CM | POA: Diagnosis not present

## 2023-02-10 DIAGNOSIS — B009 Herpesviral infection, unspecified: Secondary | ICD-10-CM | POA: Diagnosis not present

## 2023-02-10 DIAGNOSIS — O9832 Other infections with a predominantly sexual mode of transmission complicating childbirth: Secondary | ICD-10-CM | POA: Diagnosis present

## 2023-02-10 DIAGNOSIS — A6000 Herpesviral infection of urogenital system, unspecified: Secondary | ICD-10-CM | POA: Diagnosis present

## 2023-02-10 DIAGNOSIS — D62 Acute posthemorrhagic anemia: Secondary | ICD-10-CM | POA: Diagnosis not present

## 2023-02-10 DIAGNOSIS — O99213 Obesity complicating pregnancy, third trimester: Principal | ICD-10-CM | POA: Diagnosis present

## 2023-02-10 DIAGNOSIS — O26893 Other specified pregnancy related conditions, third trimester: Secondary | ICD-10-CM | POA: Diagnosis present

## 2023-02-10 DIAGNOSIS — O9903 Anemia complicating the puerperium: Secondary | ICD-10-CM | POA: Diagnosis not present

## 2023-02-10 DIAGNOSIS — A6009 Herpesviral infection of other urogenital tract: Secondary | ICD-10-CM | POA: Diagnosis present

## 2023-02-10 DIAGNOSIS — Z3A39 39 weeks gestation of pregnancy: Secondary | ICD-10-CM | POA: Diagnosis not present

## 2023-02-10 DIAGNOSIS — O99824 Streptococcus B carrier state complicating childbirth: Secondary | ICD-10-CM | POA: Diagnosis present

## 2023-02-10 DIAGNOSIS — Z8249 Family history of ischemic heart disease and other diseases of the circulatory system: Secondary | ICD-10-CM | POA: Diagnosis not present

## 2023-02-10 DIAGNOSIS — Z7982 Long term (current) use of aspirin: Secondary | ICD-10-CM | POA: Diagnosis not present

## 2023-02-10 DIAGNOSIS — Z3490 Encounter for supervision of normal pregnancy, unspecified, unspecified trimester: Secondary | ICD-10-CM

## 2023-02-10 LAB — TYPE AND SCREEN
ABO/RH(D): B POS
Antibody Screen: NEGATIVE

## 2023-02-10 LAB — CBC
HCT: 30.2 % — ABNORMAL LOW (ref 36.0–46.0)
Hemoglobin: 9.8 g/dL — ABNORMAL LOW (ref 12.0–15.0)
MCH: 24.3 pg — ABNORMAL LOW (ref 26.0–34.0)
MCHC: 32.5 g/dL (ref 30.0–36.0)
MCV: 74.8 fL — ABNORMAL LOW (ref 80.0–100.0)
Platelets: 227 10*3/uL (ref 150–400)
RBC: 4.04 MIL/uL (ref 3.87–5.11)
RDW: 15 % (ref 11.5–15.5)
WBC: 17.3 10*3/uL — ABNORMAL HIGH (ref 4.0–10.5)
nRBC: 0 % (ref 0.0–0.2)

## 2023-02-10 MED ORDER — LIDOCAINE HCL (PF) 1 % IJ SOLN
INTRAMUSCULAR | Status: AC
  Administered 2023-02-10: 30 mL via SUBCUTANEOUS
  Filled 2023-02-10: qty 30

## 2023-02-10 MED ORDER — WITCH HAZEL-GLYCERIN EX PADS: MEDICATED_PAD | CUTANEOUS | Status: DC | PRN

## 2023-02-10 MED ORDER — SIMETHICONE 80 MG PO CHEW: 80.0000 mg | CHEWABLE_TABLET | ORAL | Status: DC | PRN

## 2023-02-10 MED ORDER — OXYTOCIN-SODIUM CHLORIDE 30-0.9 UT/500ML-% IV SOLN: 2.5000 [IU]/h | INTRAVENOUS | Status: DC

## 2023-02-10 MED ORDER — OXYTOCIN-SODIUM CHLORIDE 30-0.9 UT/500ML-% IV SOLN: 2.5000 [IU]/h | INTRAVENOUS | Status: DC | PRN

## 2023-02-10 MED ORDER — SODIUM CHLORIDE 0.9 % IV SOLN: 1.0000 g | INTRAVENOUS | Status: DC

## 2023-02-10 MED ORDER — LACTATED RINGERS IV SOLN: INTRAVENOUS | Status: DC

## 2023-02-10 MED ORDER — PRENATAL MULTIVITAMIN CH
1.0000 | ORAL_TABLET | Freq: Every day | ORAL | Status: DC
  Administered 2023-02-10 – 2023-02-12 (×3): 1 via ORAL
  Filled 2023-02-10 (×3): qty 1

## 2023-02-10 MED ORDER — MISOPROSTOL 50MCG HALF TABLET: 50.0000 ug | ORAL_TABLET | ORAL | Status: DC | PRN

## 2023-02-10 MED ORDER — LIDOCAINE HCL (PF) 1 % IJ SOLN: 30.0000 mL | INTRAMUSCULAR | Status: AC | PRN

## 2023-02-10 MED ORDER — TERBUTALINE SULFATE 1 MG/ML IJ SOLN: 0.2500 mg | Freq: Once | INTRAMUSCULAR | Status: DC | PRN

## 2023-02-10 MED ORDER — LACTATED RINGERS IV SOLN: 500.0000 mL | INTRAVENOUS | Status: DC | PRN

## 2023-02-10 MED ORDER — OXYCODONE-ACETAMINOPHEN 5-325 MG PO TABS: 2.0000 | ORAL_TABLET | ORAL | Status: DC | PRN

## 2023-02-10 MED ORDER — BENZOCAINE-MENTHOL 20-0.5 % EX AERO
1.0000 | INHALATION_SPRAY | CUTANEOUS | Status: DC | PRN
  Administered 2023-02-10 – 2023-02-11 (×2): 1 via TOPICAL
  Filled 2023-02-10 (×2): qty 56

## 2023-02-10 MED ORDER — FENTANYL CITRATE (PF) 100 MCG/2ML IJ SOLN: 50.0000 ug | INTRAMUSCULAR | Status: DC | PRN

## 2023-02-10 MED ORDER — LACTATED RINGERS IV BOLUS: 1000.0000 mL | Freq: Once | INTRAVENOUS | Status: DC

## 2023-02-10 MED ORDER — OXYTOCIN BOLUS FROM INFUSION: 333.0000 mL | Freq: Once | INTRAVENOUS | Status: DC

## 2023-02-10 MED ORDER — SODIUM CHLORIDE 0.9 % IV SOLN: 2.0000 g | Freq: Once | INTRAVENOUS | Status: DC

## 2023-02-10 MED ORDER — ONDANSETRON HCL 4 MG/2ML IJ SOLN: 4.0000 mg | Freq: Four times a day (QID) | INTRAMUSCULAR | Status: DC | PRN

## 2023-02-10 MED ORDER — OXYCODONE-ACETAMINOPHEN 5-325 MG PO TABS: 1.0000 | ORAL_TABLET | ORAL | Status: DC | PRN

## 2023-02-10 MED ORDER — ACETAMINOPHEN 325 MG PO TABS: 650.0000 mg | ORAL_TABLET | ORAL | Status: DC | PRN

## 2023-02-10 MED ORDER — AMMONIA AROMATIC IN INHA
RESPIRATORY_TRACT | Status: AC
  Filled 2023-02-10: qty 10

## 2023-02-10 MED ORDER — SOD CITRATE-CITRIC ACID 500-334 MG/5ML PO SOLN: 30.0000 mL | ORAL | Status: DC | PRN

## 2023-02-10 MED ORDER — IBUPROFEN 600 MG PO TABS
600.0000 mg | ORAL_TABLET | Freq: Four times a day (QID) | ORAL | Status: DC
  Administered 2023-02-10 – 2023-02-12 (×8): 600 mg via ORAL
  Filled 2023-02-10 (×8): qty 1

## 2023-02-10 MED ORDER — OXYTOCIN 10 UNIT/ML IJ SOLN
INTRAMUSCULAR | Status: AC
  Administered 2023-02-10: 10 [IU] via INTRAMUSCULAR
  Filled 2023-02-10: qty 2

## 2023-02-10 MED ORDER — ACETAMINOPHEN 325 MG PO TABS
650.0000 mg | ORAL_TABLET | ORAL | Status: DC | PRN
  Administered 2023-02-11: 650 mg via ORAL
  Filled 2023-02-10 (×3): qty 2

## 2023-02-10 MED ORDER — DOCUSATE SODIUM 100 MG PO CAPS
100.0000 mg | ORAL_CAPSULE | Freq: Two times a day (BID) | ORAL | Status: DC
  Administered 2023-02-11 – 2023-02-12 (×4): 100 mg via ORAL
  Filled 2023-02-10 (×4): qty 1

## 2023-02-10 MED ORDER — MISOPROSTOL 200 MCG PO TABS
ORAL_TABLET | ORAL | Status: AC
  Filled 2023-02-10: qty 4

## 2023-02-10 MED ORDER — OXYCODONE-ACETAMINOPHEN 5-325 MG PO TABS
1.0000 | ORAL_TABLET | ORAL | Status: DC | PRN
  Administered 2023-02-10 – 2023-02-11 (×3): 1 via ORAL
  Filled 2023-02-10 (×3): qty 1

## 2023-02-10 MED ORDER — OXYTOCIN-SODIUM CHLORIDE 30-0.9 UT/500ML-% IV SOLN: 1.0000 m[IU]/min | INTRAVENOUS | Status: DC

## 2023-02-10 MED ORDER — DIPHENHYDRAMINE HCL 25 MG PO CAPS: 25.0000 mg | ORAL_CAPSULE | Freq: Four times a day (QID) | ORAL | Status: DC | PRN

## 2023-02-10 MED ORDER — LIDOCAINE HCL (PF) 1 % IJ SOLN: 30.0000 mL | INTRAMUSCULAR | Status: DC | PRN

## 2023-02-10 MED ORDER — COCONUT OIL OIL
1.0000 | TOPICAL_OIL | Status: DC | PRN
  Administered 2023-02-11: 1 via TOPICAL
  Filled 2023-02-10: qty 7.5

## 2023-02-10 NOTE — H&P (Signed)
[facility] Labor & Delivery  History and Physical  ASSESSMENT AND PLAN   Felicia Jones is a 27 y.o. [id number] at [redacted]w[redacted]d with EDD: 02/11/2023, by Ultrasound admitted for management of spontaneous labor.  Labor Management - Patient is pushing spontaneously on arrival to L&D. Desires unmedicated birth.   Fetal Status: - cephalic presentation by sutures on exam - EFW: not able to be estimated due to patient's advanced progress - Intermittent  - FHTs difficult to obtain due to patient position while pushing. In the 120's when able to hear.   Obesity - Last growth [place] on 7/17, EFW 66%tile, AC 84%tile  HSV - On Valtrex suppression. - Unable to perform sterile speculum exam due to advanced stage of labor. No lesions noted on exam after birth.  Labs/Immunizations: TDAP: UTD Flu: N/a Rubella: Imm Varicella Imm HIV: Neg Hep B: Neg Hep C: Neg RPR: Neg GBS: positive  Lab Results  Component Value Date   VZVIGG 762 07/28/2022   HIV Non Reactive 11/22/2022     Postpartum Plan: - Feeding: No data found - Contraception: plans undecided - Prenatal Care Provider: AOB  Attending Dr. Dalbert Garnet was immediately available for the care of the patient.   HPI   Chief Complaint: Contractions  Felicia Jones is a 27 y.o. [id number] at [redacted]w[redacted]d who presents for evaluation of labor. She reports contractions starting earlier this morning and spontaneous rupture of membranes around 1000 to clear fluid. On presentation to L&D, having urge to bear down.     Pregnancy Complications Patient Active Problem List   Diagnosis Date Noted   Obesity complicating pregnancy in third trimester 02/10/2023   Indication for care in labor or delivery 02/10/2023   Labor and delivery indication for care or intervention 02/10/2023   SVD (spontaneous vaginal delivery) 02/10/2023   Supervision of other normal pregnancy, antepartum 07/07/2022   Herpes simplex of female genitalia 05/30/2022   Generalized  anxiety disorder 07/27/2021   Obesity affecting pregnancy in third trimester 06/16/2020   Bilateral low back pain without sciatica 01/28/2016    Review of Systems A twelve point review of systems was negative except as stated in HPI.   HISTORY   Medications Medications Prior to Admission  Medication Sig Dispense Refill Last Dose   aspirin EC 81 MG tablet Take 1 tablet (81 mg total) by mouth daily. Swallow whole. Start at 14-[redacted] weeks pregnant 30 tablet 12    prenatal vitamin w/FE, FA (NATACHEW) 29-1 MG CHEW chewable tablet Chew 1 tablet by mouth daily at 12 noon. 30 tablet 11    valACYclovir (VALTREX) 500 MG tablet Take 1 tablet by mouth daily.       Allergies has No Known Allergies.   OB History OB History  Gravida Para Term Preterm AB Living  4 2 2  0 1 2  SAB IAB Ectopic Multiple Live Births  0 1 0 0 2    # Outcome Date GA Lbr Len/2nd Weight Sex Type Anes PTL Lv  4 Current           3 Term 01/16/21 [redacted]w[redacted]d 08:26 / 00:05 2990 g M Vag-Spont None  LIV     Name: Felicia, Jones     Apgar1: 8  Apgar5: 9  2 IAB 2022          1 Term 05/16/18 [redacted]w[redacted]d / 03:44 3160 g M Vag-Spont EPI  LIV     Name: FeliciaBOY Jones     Apgar1: 9  Apgar5: 9  Past Medical History Past Medical History:  Diagnosis Date   Arthritis    in back   Migraines     Past Surgical History Past Surgical History:  Procedure Laterality Date   WISDOM TOOTH EXTRACTION  2016   four;    Social History  reports that she has never smoked. She has never used smokeless tobacco. She reports that she does not currently use alcohol after a past usage of about 1.0 standard drink of alcohol per week. She reports that she does not use drugs.   Family History family history includes Anxiety disorder in her brother; Cancer in her paternal grandmother; Diabetes in her maternal grandfather and maternal grandmother; Healthy in her brother, brother, father, half-sister, half-sister, half-sister, half-sister, and paternal  grandfather; Hypertension in her maternal grandfather and maternal grandmother; Migraines in her mother.   PHYSICAL EXAM   There were no vitals filed for this visit.  Constitutional: No acute distress, well appearing, and well nourished. Neurologic: She is alert and focused.  Psychiatric: She has a normal mood and affect.  Musculoskeletal: Normal gait, grossly normal range of motion Cardiovascular: Normal rate.   Pulmonary/Chest: Breathing elevated due to advanced stage of labor.  Gastrointestinal/Abdominal:  Gravid. There is no tenderness.  Skin: Skin is warm and dry. No rash noted.  Genitourinary: Normal external female genitalia.  SVE:  9.5/100/+2   SSE: deferred  NST Interpretation Unable to perform NST due to advanced stage of labor. FHTs in the 120s.

## 2023-02-10 NOTE — Discharge Summary (Signed)
Postpartum Discharge Summary  Patient Name: Felicia Jones DOB: 02-09-1996 MRN: [id number]  Date of admission: 02/10/2023 Delivery date:02/10/2023 Delivering provider: Malic Rosten, Lindalou Hose Date of discharge: 02/12/2023  Admitting diagnosis: Obesity complicating pregnancy in third trimester [O99.213] Indication for care in labor or delivery [O75.9] Labor and delivery indication for care or intervention [O75.9] Intrauterine pregnancy: [redacted]w[redacted]d     Secondary diagnosis:  Principal Problem:   Obesity complicating pregnancy in third trimester Active Problems:   Herpes simplex of female genitalia   Indication for care in labor or delivery   Labor and delivery indication for care or intervention   SVD (spontaneous vaginal delivery)     Discharge diagnosis: Term Pregnancy Delivered                                              Post partum procedures: none Augmentation: None Complications: Shoulder dystocia  Hospital course: Onset of Labor With Vaginal Delivery      27 y.o. yo [phone] at [redacted]w[redacted]d was admitted in Active Labor on 02/10/2023. Labor course was complicated by shoulder dystocia relieved after 30 seconds with McRoberts.  Membrane Rupture Time/Date: 10:15 AM,02/10/2023  Delivery Method:Vaginal, Spontaneous Operative Delivery:N/A Episiotomy: None Lacerations:  1st degree Patient had an uncomplicated postpartum course.  She is ambulating, tolerating a regular diet, passing flatus, and urinating well. Patient is discharged home in stable condition on 02/12/23.  Newborn Data: Birth date:02/10/2023 Birth time:11:40 AM Gender:Female Living status:Living Apgars:7 ,9  Weight:4050 g  Magnesium Sulfate received: No BMZ received: No Rhophylac:N/A MMR:N/A T-DaP:Given prenatally Flu: N/A Transfusion:No  Physical exam  Vitals:   02/11/23 0752 02/11/23 1605 02/11/23 2311 02/12/23 0755  BP: 110/74 120/68 113/70 116/60  Pulse: 80 86 (!) 113 (!) 107  Resp: 16 18 20 20   Temp: 98.2 F (36.8 C)  98.5 F (36.9 C) 98.5 F (36.9 C) 98.6 F (37 C)  TempSrc: Oral Oral  Oral  SpO2: 99% 95% 99% 100%   General: alert and no distress Lochia: appropriate Uterine Fundus: firm Incision: N/A DVT Evaluation: No evidence of DVT seen on physical exam. Labs: Lab Results  Component Value Date   WBC 17.3 (H) 02/10/2023   HGB 9.8 (L) 02/10/2023   HCT 30.2 (L) 02/10/2023   MCV 74.8 (L) 02/10/2023   PLT 227 02/10/2023      Latest Ref Rng & Units 08/10/2022    2:03 AM  CMP  Glucose 70 - 99 mg/dL 962   BUN 6 - 20 mg/dL 8   Creatinine 9.52 - 8.41 mg/dL 3.24   Sodium 401 - 027 mmol/L 135   Potassium 3.5 - 5.1 mmol/L 3.6   Chloride 98 - 111 mmol/L 105   CO2 22 - 32 mmol/L 22   Calcium 8.9 - 10.3 mg/dL 8.5   Total Protein 6.5 - 8.1 g/dL 6.9   Total Bilirubin 0.3 - 1.2 mg/dL 0.6   Alkaline Phos 38 - 126 U/L 40   AST 15 - 41 U/L 16   ALT 0 - 44 U/L 10    Edinburgh Score:    02/12/2023    9:06 AM  Edinburgh Postnatal Depression Scale Screening Tool  I have been able to laugh and see the funny side of things. 0  I have looked forward with enjoyment to things. 0  I have blamed myself unnecessarily when things went wrong. 0  I have been anxious or worried for no good reason. 0  I have felt scared or panicky for no good reason. 0  Things have been getting on top of me. 0  I have been so unhappy that I have had difficulty sleeping. 0  I have felt sad or miserable. 1  I have been so unhappy that I have been crying. 0  The thought of harming myself has occurred to me. 0  Edinburgh Postnatal Depression Scale Total 1      After visit meds:  Allergies as of 02/12/2023   No Known Allergies      Medication List     STOP taking these medications    aspirin EC 81 MG tablet   valACYclovir 500 MG tablet Commonly known as: VALTREX       TAKE these medications    acetaminophen 325 MG tablet Commonly known as: Tylenol Take 2 tablets (650 mg total) by mouth every 4 (four) hours as  needed (for pain scale < 4).   ibuprofen 600 MG tablet Commonly known as: ADVIL Take 1 tablet (600 mg total) by mouth every 6 (six) hours.   prenatal vitamin w/FE, FA 29-1 MG Chew chewable tablet Chew 1 tablet by mouth daily at 12 noon.         Discharge home in stable condition Infant Feeding:  Breast and pumping to bottle feed Infant Disposition:home with mother Discharge instruction: per After Visit Summary and Postpartum booklet. Activity: Advance as tolerated. Pelvic rest for 6 weeks.  Diet: routine diet Anticipated Birth Control: Unsure, considering patch if milk supply is well established at 6 weeks Postpartum Appointment:6 weeks Additional Postpartum F/U: Postpartum Depression checkup Future Appointments:No future appointments. Follow up Visit:  Follow-up Information     Kihanna Kamiya, Lindalou Hose, CNM. Schedule an appointment as soon as possible for a visit.   Specialty: Obstetrics and Gynecology Why: 2 weeks telehealth and 6 weeks in person Contact information: [address] [place] [address] [phone]                     02/12/2023 Lindalou Hose Leona Alen, CNM

## 2023-02-10 NOTE — Lactation Note (Signed)
This note was copied from a baby's chart. Lactation Consultation Note  Patient Name: Felicia Jones [id number] Date: 02/10/2023 Age:25 hours Reason for consult: L&D Initial assessment;1st time breastfeeding;Term   Maternal Data Has patient been taught Hand Expression?: Yes Does the patient have breastfeeding experience prior to this delivery?: Yes How long did the patient breastfeed?: 2 yrs  Feeding Mother's Current Feeding Choice: Breast Milk Baby nursing on left breast in cradle hold when room entered, nursing well with swallows, mom is experienced breastfeeder.   LATCH Score Latch: Grasps breast easily, tongue down, lips flanged, rhythmical sucking.  Audible Swallowing: Spontaneous and intermittent  Type of Nipple: Everted at rest and after stimulation  Comfort (Breast/Nipple): Soft / non-tender  Hold (Positioning): No assistance needed to correctly position infant at breast.  LATCH Score: 10   Lactation Tools Discussed/Used    Interventions Interventions: Skin to skin  Discharge    Consult Status Consult Status: Follow-up from L&D    Dyann Kief 02/10/2023, 12:36 PM

## 2023-02-11 ENCOUNTER — Inpatient Hospital Stay: Admit: 2023-02-11 | Payer: Self-pay

## 2023-02-11 LAB — RPR: RPR Ser Ql: NONREACTIVE

## 2023-02-11 NOTE — Progress Notes (Signed)
   Subjective:   Wyvonne Holder had a NSVB on 02/10/23. Her labor was complicated by a shoulder dystocia, resolved with position change/McRoberts. Has had routine postpartum care.  Pt. Is eating, hydrating, and voiding regularly without difficulty. Has yet to have BM. She is breastfeeding. Reports mild/moderate vaginal bleeding, denies passing large blood clots. Has had cramping abdomen pain relieved with tylenol/ibuprofen. Denies anxiety/depression symptoms. Endorses good support from partner and family.   Objective:  Vital signs in last 24 hours: Temp:  [98 F (36.7 C)-98.6 F (37 C)] 98.2 F (36.8 C) (08/11 0752) Pulse Rate:  [68-117] 80 (08/11 0752) Resp:  [14-20] 16 (08/11 0752) BP: (86-126)/(50-74) 110/74 (08/11 0752) SpO2:  [91 %-100 %] 99 % (08/11 0752)    General: NAD Pulmonary: no increased work of breathing Breasts: soft, non-tender, nipples without breakdown Abdomen: soft, non-tender Fundus: firm, midline, at umbilicus Lochia: light rubra, no clots Perineum: no erythema or foul odor discharge, minimal edema, laceration well approximated  Extremities: no edema, no erythema, no tenderness  Results for orders placed or performed during the hospital encounter of 02/10/23 (from the past 72 hour(s))  CBC     Status: Abnormal   Collection Time: 02/10/23  2:23 PM  Result Value Ref Range   WBC 17.3 (H) 4.0 - 10.5 K/uL   RBC 4.04 3.87 - 5.11 MIL/uL   Hemoglobin 9.8 (L) 12.0 - 15.0 g/dL   HCT 16.1 (L) 09.6 - 04.5 %   MCV 74.8 (L) 80.0 - 100.0 fL   MCH 24.3 (L) 26.0 - 34.0 pg   MCHC 32.5 30.0 - 36.0 g/dL   RDW 40.9 81.1 - 91.4 %   Platelets 227 150 - 400 K/uL   nRBC 0.0 0.0 - 0.2 %    Comment: Performed at [facility], [address] [place], [place] [address]  RPR     Status: None   Collection Time: 02/10/23  2:23 PM  Result Value Ref Range   RPR Ser Ql NON REACTIVE NON REACTIVE    Comment: Performed at [facility], [address] [place], [place]  [address]  Type and screen [facility]     Status: None   Collection Time: 02/10/23  2:23 PM  Result Value Ref Range   ABO/RH(D) B POS    Antibody Screen NEG    Sample Expiration      02/13/2023,2359 Performed at [facility], [address] [place], [place] [address]     Assessment:   27 y.o. [phone] 1 day(s)  s/p NSVB Breastfeeding Anemia secondary to acute blood loss- hemodynamically stable and symptomatic VSS Pain well controlled  Plan:    PO Fe Blood Type --/--/B POS (08/10 1423) / Rubella 1.72 (01/26 1013) / Varicella Immune Rhogam not indicated Tdap/varicella/rubella to be offered before discharge if indicated Feeding plan breast, lactation support Encouraged to continue breastfeeding, BF education on latch, position changes, cluster feeding, hunger cues, lactogenesis II, milk supply Education given regarding options for contraception, as well as compatibility with breast feeding if applicable.  Patient for contraception undecided. Continued routine postpartum care  Counseled on normal uterine involution and vaginal bleeding postpartum Anticipate discharge home tomorrow    Dominica Severin, CNM [facility] 02/11/2023, 9:46 AM

## 2023-02-11 NOTE — Lactation Note (Signed)
This note was copied from a baby's chart. Lactation Consultation Note  Patient Name: Felicia Jones UUVOZ'D Date: 02/11/2023 Age:25 hours Reason for consult: Follow-up assessment  Lactation Rounds: LC to the room for a visit. Mother and baby are resting. Mother states feeds are going well. LC reviewed and encouraged feeding on demand and with cues. If baby is not cueing we encourage hand expression and to wake baby. Reviewed diaper counts for days of life and when to call Peds with questions. Reviewed "understanding Postpartum and Newborn care " booklet at bedside. Reviewed outpatient Lactation number and resources. Reviewed pacifier, pumping, and bottles are not encouraged until breastfeeding is established and going well in the first 4 weeks. Parents stated understanding with all teaching.   Maternal Data Has patient been taught Hand Expression?:  (mother is an expierenced BF) Does the patient have breastfeeding experience prior to this delivery?: Yes How long did the patient breastfeed?: 2 years  Feeding Mother's Current Feeding Choice: Breast Milk  Interventions Interventions: Breast feeding basics reviewed;Ice;Education  Discharge Discharge Education: Engorgement and breast care;Warning signs for feeding baby Pump: Personal (states she has several pumps at home)  Consult Status Consult Status: PRN    Marcellous Snarski D [name] 02/11/2023, 11:24 AM

## 2023-02-12 MED ORDER — IBUPROFEN 600 MG PO TABS: 600.0000 mg | ORAL_TABLET | Freq: Four times a day (QID) | ORAL | Status: DC

## 2023-02-12 MED ORDER — ACETAMINOPHEN 325 MG PO TABS: 650.0000 mg | ORAL_TABLET | ORAL | Status: DC | PRN

## 2023-02-12 NOTE — Lactation Note (Signed)
This note was copied from a baby's chart. Lactation Consultation Note  Patient Name: Felicia Jones UUVOZ'D Date: 02/12/2023 Age:25 hours Reason for consult: Follow-up assessment  Lactation Rounds: LC to the room for a visit. Mother and baby are resting. Mother states feeds are going well, and baby just finished eating. LC reviewed and encouraged feeding on demand and with cues. Reviewed diaper counts for days of life and when to call Peds with questions. Reviewed "understanding Postpartum and Newborn care " booklet at bedside. Reviewed outpatient Lactation number.Mother has no questions at this time.    Feeding Mother's Current Feeding Choice: Breast Milk  Interventions Interventions: Breast feeding basics reviewed;Education  Discharge Discharge Education: Engorgement and breast care;Warning signs for feeding baby Pump: Personal  Consult Status Consult Status: PRN    Brindle Leyba D Sutton Hirsch 02/12/2023, 11:59 AM

## 2023-02-12 NOTE — Progress Notes (Signed)
Patient discharged home with family.  Discharge instructions, when to follow up, and prescriptions reviewed with patient.  Patient verbalized understanding. Patient will be escorted out by auxiliary.   

## 2023-03-06 ENCOUNTER — Telehealth: Payer: Self-pay | Admitting: Licensed Clinical Social Worker

## 2023-03-06 NOTE — Telephone Encounter (Signed)
Patient sent email requesting to schedule an appointment with LCSW. LCSW replied to patient with appointment options.

## 2023-03-15 ENCOUNTER — Telehealth: Payer: Self-pay | Admitting: Licensed Clinical Social Worker

## 2023-03-15 ENCOUNTER — Ambulatory Visit: Payer: Medicaid Other | Admitting: Licensed Clinical Social Worker

## 2023-03-15 NOTE — Telephone Encounter (Signed)
LCSW and patient have each called and lvm in attempt to reschedule her cancelled appointment.

## 2023-03-28 ENCOUNTER — Other Ambulatory Visit (HOSPITAL_COMMUNITY)
Admission: RE | Admit: 2023-03-28 | Discharge: 2023-03-28 | Disposition: A | Discharge status: Home / Self Care | Payer: Medicaid Other | Source: Ambulatory Visit

## 2023-03-28 ENCOUNTER — Ambulatory Visit (INDEPENDENT_AMBULATORY_CARE_PROVIDER_SITE_OTHER): Payer: Medicaid Other

## 2023-03-28 VITALS — BP 114/55 | HR 76 | Ht 65.0 in | Wt 213.0 lb

## 2023-03-28 DIAGNOSIS — Z124 Encounter for screening for malignant neoplasm of cervix: Secondary | ICD-10-CM | POA: Insufficient documentation

## 2023-03-28 DIAGNOSIS — Z3009 Encounter for other general counseling and advice on contraception: Secondary | ICD-10-CM

## 2023-03-28 MED ORDER — NORELGESTROMIN-ETH ESTRADIOL 150-35 MCG/24HR TD PTWK
1.0000 | MEDICATED_PATCH | TRANSDERMAL | 12 refills | Status: DC
Start: 2023-03-28 — End: 2024-05-27

## 2023-03-28 NOTE — Progress Notes (Signed)
   Post Partum Exam  Felicia Jones is a 27 y.o. [phone] female who presents for a postpartum visit. She is 6 weeks postpartum following a spontaneous vaginal delivery. I have fully reviewed the prenatal and intrapartum course. The delivery was at [redacted]w[redacted]d gestational weeks.  Anesthesia: local. Postpartum course has been uncomplicated.   Bleeding  occasional spotting . Bowel function is normal. Bladder function is normal.  Patient is not sexually active. Contraception method is  Financial risk analyst , prescribed today.  Postpartum depression screening:EPDS 7, planning to start talking with counselor she saw after her last pregnancy.   Baby's course has been uncomplicated. Baby is feeding by breast.   Review of Systems Pertinent items noted in HPI and remainder of comprehensive ROS otherwise negative.    Objective:  Blood pressure (!) 114/55, pulse 76, height 5\' 5"  (1.651 m), weight 213 lb (96.6 kg), last menstrual period 05/12/2022, currently breastfeeding.  General:  alert and no distress   Breasts:  deferred  Lungs: clear to auscultation bilaterally  Heart:  regular rate and rhythm, S1, S2 normal, no murmur, click, rub or gallop  Abdomen: soft, non-tender; bowel sounds normal; no masses,  no organomegaly   Vulva:  normal  Vagina: normal vagina, no discharge, exudate, lesion, or erythema  Cervix:  anteverted, no cervical motion tenderness, and no lesions  Corpus: not examined  Adnexa:  not evaluated  Rectal Exam: Not performed.        Assessment:    Normal postpartum exam. Laceration well healed. Pap smear done at today's visit.   Plan:   1. Contraception:  Xulane patch , reviewed continuous cycling 2. EPDS 7 today, has history of postpartum depression with previous pregnancy for which she met with a therapist. She has an appointment with same therapist in 1 week. Will follow up with clinic if she desires medication management. 3. Declined flu vaccination today.  4. Follow up in: 1  year   or as needed.   Lindalou Hose Alastair Hennes, CNM  09/25/248:46 AM

## 2023-04-02 LAB — CYTOLOGY - PAP: Diagnosis: NEGATIVE

## 2023-04-05 ENCOUNTER — Ambulatory Visit: Payer: Medicaid Other | Admitting: Licensed Clinical Social Worker

## 2023-04-05 DIAGNOSIS — F411 Generalized anxiety disorder: Secondary | ICD-10-CM

## 2023-04-05 NOTE — Progress Notes (Signed)
Counselor/Therapist Progress Note  Patient ID: Felicia Jones, MRN: [id number],    Date: 04/05/2023  Time Spent: 50 minutes    Treatment Type: Psychotherapy  Reported Symptoms:  Anxiety, anxious thoughts, low mood  Mental Status Exam:  Appearance:   Casual and Neat     Behavior:  Appropriate, Sharing, and Motivated  Motor:  Normal  Speech/Language:   Clear and Coherent and Normal Rate  Affect:  Appropriate, Congruent, and Full Range  Mood:  normal  Thought process:  normal  Thought content:    WNL  Sensory/Perceptual disturbances:    WNL  Orientation:  oriented to person, place, time/date, situation, and day of week  Attention:  Good  Concentration:  Good  Memory:  WNL  Fund of knowledge:   Good  Insight:    Fair  Judgment:   Fair  Impulse Control:  Fair   Risk Assessment: Danger to Self:  No Self-injurious Behavior: No Danger to Others: No Duty to Warn:no Physical Aggression / Violence:No  Access to Firearms a concern: No  Gang Involvement:No   Subjective: Patient was engaged and cooperative throughout the session using time effectively to discuss thoughts and feelings. She presents reporting that she had a baby nearly 2 months ago and reports a lot has changed over the past year. She reports she is married now and her marital relationship is going well. She reports struggles adjusting to new baby and having 3 children. She shares that adjusting to 3 children has been a big adjustment, she is breastfeeding, is sleeping 7-8 hours a day (interrupted sleep), she describes struggling to meet her own parenting expectations, and has a low appetite. Patient voices motivation for treatment.     04/05/2023    9:01 AM 04/06/2021    3:57 PM  GAD 7 : Generalized Anxiety Score  Nervous, Anxious, on Edge 1 2  Control/stop worrying 3 3  Worry too much - different things 3 3  Trouble relaxing 3 3  Restless 1 2  Easily annoyed or irritable 3 3  Afraid - awful might happen 1 0  Total  GAD 7 Score 15 16  Anxiety Difficulty Somewhat difficult Somewhat difficult       04/05/2023    9:00 AM  Depression screen PHQ 2/9  Decreased Interest 0  Down, Depressed, Hopeless 1  PHQ - 2 Score 1   Interventions: Cognitive Behavioral Therapy and client centered  LCSW met with patient to identify needs related to stressors and functioning, and assess and monitor for signs and symptoms of postpartum mood and anxiety disorders, and assess safety. Checked in with patient regarding presenting concerns and conducted brief assessment. Provide brief psychoeducation on postpartum mood and anxiety disorders signs and symptoms and discussed self-care strategies to prevent and/or reduce symptoms of postpartum mood and anxiety disorders, including sleep hygiene, eating regularly, drinking fluids, getting outside, and support from partner, family, and/or friends. Provided support through active listening, validation of feelings, and highlighted patient's strengths.   Diagnosis:   ICD-10-CM   1. Generalized anxiety disorder  F41.1      Plan: To be determine at next session.   Future Appointments  Date Time Provider Department Center  04/10/2023 11:50 AM Kathreen Cosier, LCSW [facility] None    Kathreen Cosier, [place]

## 2023-04-10 ENCOUNTER — Ambulatory Visit: Payer: Medicaid Other | Admitting: Licensed Clinical Social Worker

## 2023-04-10 DIAGNOSIS — F411 Generalized anxiety disorder: Secondary | ICD-10-CM

## 2023-04-10 NOTE — Progress Notes (Signed)
Counselor/Therapist Progress Note  Patient ID: Felicia Jones, MRN: [id number],    Date: 04/10/2023  Time Spent: 50 minutes    Treatment Type: Psychotherapy  Reported Symptoms:  mood is a little better  Mental Status Exam:  Appearance:   Casual and Neat     Behavior:  Appropriate, Sharing, and Motivated  Motor:  Normal  Speech/Language:   Clear and Coherent and Normal Rate  Affect:  Appropriate, Congruent, and Full Range  Mood:  normal  Thought process:  normal  Thought content:    WNL  Sensory/Perceptual disturbances:    WNL  Orientation:  oriented to person, place, time/date, situation, and day of week  Attention:  Good  Concentration:  Good  Memory:  WNL  Fund of knowledge:   Good  Insight:    Fair  Judgment:   Fair  Impulse Control:  Fair   Risk Assessment: Danger to Self:  No Self-injurious Behavior: No Danger to Others: No Duty to Warn:no Physical Aggression / Violence:No  Access to Firearms a concern: No  Gang Involvement:No   Subjective: Patient was engaged and cooperative throughout the session using time effectively to discuss thoughts and feelings and goal of treatment and treatment plan. She voices her mood has been slightly better since re-engagement session and reports that she has been able to have more open communication with her husband about her needs to more sleep and time to herself. Patient voices continued motivation for treatment and voices agreement with treatment plan.   Interventions: Cognitive Behavioral Therapy and Client Centered  Established psychological safety.  Checked in with patient and reviewed previous session, including brief assessment. Reviewed CBTs, and explored patient's goal of treatment and worked collaboratively to develop treatment plan discussing interventions. Reviewed mindfulness and sent patient guided meditations for homework practice.  Provided support through active listening, validation of feelings, and highlighted  patient's strengths.  Diagnosis:   ICD-10-CM   1. Generalized anxiety disorder  F41.1      Plan: Patient's goal of treatment is how to not get so aggravated so fast, not feel so anxious all the time and have so many worries, learn to not stress so much.  Treatment Target: Understand the relationship between thoughts, emotions, and behaviors  Psychoeducation on CBTs model   Oriented the client to the therapeutic approach Teach the connection between thoughts, emotions, and behaviors  Treatment Target: Increase realistic balanced thinking -to learn how to replace thinking with thoughts that are more accurate or helpful Explore patient's thoughts, beliefs, automatic thoughts, assumptions  Identify and replace unhelpful thinking patterns (upsetting ideas, self-talk and mental images) Process distress and allow for emotional release  Questioning and challenging thoughts Cognitive reappraisal  Restructuring, Socratic questioning  Provide psychoeducation on core beliefs, explore, and assist patient in identifying core beliefs  Treatment Target: Increase psychological flexibility and create a more meaningful life Mindfulness Acceptance practices  Values clarification   Increase meaningful and pleasurable events/activities  Cognitive Diffusion exercises  Treatment Target: Increase emotional regulation  Coping skills to manage anxiety   Teach distress tolerance techniques - "what helps me"  Opposite action PLEASE  skills or self-care skills  Self-soothing  Cope ahead skills - imagery, rehearsal, problem-solving, exposure   Assertiveness communication    Future Appointments  Date Time Provider Department Center  04/19/2023  2:10 PM Kathreen Cosier, LCSW [facility] None     Kathreen Cosier, LCSW

## 2023-04-19 ENCOUNTER — Ambulatory Visit: Payer: Medicaid Other | Admitting: Licensed Clinical Social Worker

## 2023-04-19 DIAGNOSIS — F411 Generalized anxiety disorder: Secondary | ICD-10-CM

## 2023-04-19 NOTE — Progress Notes (Signed)
Counselor/Therapist Progress Note  Patient ID: Felicia Jones, MRN: [id number],    Date: 04/19/2023  Time Spent: 40 minutes patient arrived late    Treatment Type: Psychotherapy  Reported Symptoms:  Feeling "a lot" better, less depressed, less anxiety   Mental Status Exam:  Appearance:   Casual     Behavior:  Appropriate, Sharing, and Motivated  Motor:  Normal  Speech/Language:   Clear and Coherent and Normal Rate  Affect:  Appropriate, Congruent, and Full Range  Mood:  normal  Thought process:  normal  Thought content:    WNL  Sensory/Perceptual disturbances:    WNL  Orientation:  oriented to person, place, time/date, situation, and day of week  Attention:  Good  Concentration:  Good  Memory:  WNL  Fund of knowledge:   Good  Insight:    Fair  Judgment:   Fair  Impulse Control:  Fair   Risk Assessment: Danger to Self:  No Self-injurious Behavior: No Danger to Others: No Duty to Warn:no Physical Aggression / Violence:No  Access to Firearms a concern: No  Gang Involvement:No   Subjective: Patient was engaged and cooperative throughout the session using time effectively to discuss thoughts and feelings. Patient reports that she feels "a lot" better, she is less focused on the small things, getting on a schedule, having time for herself, practicing more self-care- sleeping, eating, and listening to motivational videos.  Patient voices continued motivation for treatment and reports benefit of regular sessions in addressing depressive symptoms and anxiety.   Interventions: Cognitive Behavioral Therapy and Client Centered  Established psychological safety. LCSW met with patient to identify needs related to stressors and functioning, and assess and monitor depression and anxiety symptoms, and assess safety. Checked in with patient regarding their week and processed with the patient how they have been doing since the last follow-up session. LCSW validated patient's anxiety, provided  psychoeducation on anxiety,  highlighted anxious thoughts and encouraged perspective taking. Provided support through active listening, validation of feelings, and highlighted patient's strengths.   Diagnosis:   ICD-10-CM   1. Generalized anxiety disorder  F41.1      Plan:  Patient's goal of treatment is how to not get so aggravated so fast, not feel so anxious all the time and have so many worries, learn to not stress so much.  Treatment Target: Understand the relationship between thoughts, emotions, and behaviors  Psychoeducation on CBTs model   Oriented the client to the therapeutic approach Teach the connection between thoughts, emotions, and behaviors  Treatment Target: Increase realistic balanced thinking -to learn how to replace thinking with thoughts that are more accurate or helpful Explore patient's thoughts, beliefs, automatic thoughts, assumptions  Identify and replace unhelpful thinking patterns (upsetting ideas, self-talk and mental images) Process distress and allow for emotional release  Questioning and challenging thoughts Cognitive reappraisal  Restructuring, Socratic questioning  Provide psychoeducation on core beliefs, explore, and assist patient in identifying core beliefs  Treatment Target: Increase psychological flexibility and create a more meaningful life Mindfulness Acceptance practices  Values clarification   Increase meaningful and pleasurable events/activities  Cognitive Diffusion exercises  Treatment Target: Increase emotional regulation  Coping skills to manage anxiety   Teach distress tolerance techniques - "what helps me"  Opposite action PLEASE  skills or self-care skills  Self-soothing  Cope ahead skills - imagery, rehearsal, problem-solving, exposure   Assertiveness communication   Future Appointments  Date Time Provider Department Center  05/01/2023  9:00 AM Kathreen Cosier, LCSW [facility] None  Kathreen Cosier, LCSW

## 2023-05-01 ENCOUNTER — Ambulatory Visit: Payer: Medicaid Other | Admitting: Licensed Clinical Social Worker

## 2023-05-01 DIAGNOSIS — F411 Generalized anxiety disorder: Secondary | ICD-10-CM

## 2023-05-01 NOTE — Progress Notes (Signed)
Counselor/Therapist Progress Note  Patient ID: Felicia Jones, MRN: [id number],    Date: 05/01/2023  Time Spent: 50 minutes    Treatment Type: Psychotherapy  Reported Symptoms:  overwhelm, anxiety, anxious thoughts   Mental Status Exam:  Appearance:   Casual     Behavior:  Appropriate, Sharing, and Motivated  Motor:  Normal  Speech/Language:   Clear and Coherent and Normal Rate  Affect:  Appropriate, Congruent, and Full Range  Mood:  normal  Thought process:  normal  Thought content:    WNL  Sensory/Perceptual disturbances:    WNL  Orientation:  oriented to person, place, time/date, situation, and day of week  Attention:  Good  Concentration:  Good  Memory:  WNL  Fund of knowledge:   Good  Insight:    Fair  Judgment:   Fair  Impulse Control:  Fair   Risk Assessment: Danger to Self:  No Self-injurious Behavior: No Danger to Others: No Duty to Warn:no Physical Aggression / Violence:No  Access to Firearms a concern: No  Gang Involvement:No   Subjective: Patient was engaged and cooperative throughout the session using time effectively to discuss thoughts and feelings. Patient reports some challenges over the past week with feeling overwhelmed, and communication challenges with her husband. She reports that she has benefited from balanced self-talk. Patient was receptive to feedback and intervention from LCSW.    Interventions: Cognitive Behavioral Therapy and Client Centered  Established psychological safety. Checked in with patient regarding their week. LCSW assisted patient in processing their thoughts and emotions regarding challenges over the past week related to marital challenges. LCSW validated patient's feelings of overwhelm and stress and discussed options to increase open communication with husband to increase her opportunity for increased self-care and sleep. Provided support through active listening, validation of feelings, and highlighted patient's  strengths.  Diagnosis:   ICD-10-CM   1. Generalized anxiety disorder  F41.1      Plan: Patient's goal of treatment is how to not get so aggravated so fast, not feel so anxious all the time and have so many worries, learn to not stress so much.  Treatment Target: Understand the relationship between thoughts, emotions, and behaviors  Psychoeducation on CBTs model   Oriented the client to the therapeutic approach Teach the connection between thoughts, emotions, and behaviors  Treatment Target: Increase realistic balanced thinking -to learn how to replace thinking with thoughts that are more accurate or helpful Explore patient's thoughts, beliefs, automatic thoughts, assumptions  Identify and replace unhelpful thinking patterns (upsetting ideas, self-talk and mental images) Process distress and allow for emotional release  Questioning and challenging thoughts Cognitive reappraisal  Restructuring, Socratic questioning  Provide psychoeducation on core beliefs, explore, and assist patient in identifying core beliefs  Treatment Target: Increase psychological flexibility and create a more meaningful life Mindfulness Acceptance practices  Values clarification   Increase meaningful and pleasurable events/activities  Cognitive Diffusion exercises  Treatment Target: Increase emotional regulation  Coping skills to manage anxiety   Teach distress tolerance techniques - "what helps me"  Opposite action PLEASE  skills or self-care skills  Self-soothing  Cope ahead skills - imagery, rehearsal, problem-solving, exposure   Assertiveness communication   Future Appointments  Date Time Provider Department Center  05/07/2023  8:00 AM Kathreen Cosier, LCSW [facility] None     Kathreen Cosier, LCSW

## 2023-05-07 ENCOUNTER — Ambulatory Visit: Payer: Medicaid Other | Admitting: Licensed Clinical Social Worker

## 2023-05-07 DIAGNOSIS — F411 Generalized anxiety disorder: Secondary | ICD-10-CM

## 2023-05-07 NOTE — Progress Notes (Signed)
Counselor/Therapist Progress Note  Patient ID: Felicia Jones, MRN: [id number],    Date: 05/07/2023  Time Spent: 50 minutes    Treatment Type: Psychotherapy  Reported Symptoms:  stable mood, decrease in Anxiety, anxious thoughts   Mental Status Exam:  Appearance:   Casual     Behavior:  Appropriate, Sharing, and Motivated  Motor:  Normal  Speech/Language:   Clear and Coherent and Normal Rate  Affect:  Appropriate, Congruent, and Full Range  Mood:  normal  Thought process:  normal  Thought content:    WNL  Sensory/Perceptual disturbances:    WNL  Orientation:  oriented to person, place, time/date, and situation  Attention:  Good  Concentration:  Good  Memory:  WNL  Fund of knowledge:   Good  Insight:    Fair  Judgment:   Fair  Impulse Control:  Fair   Risk Assessment: Danger to Self:  No Self-injurious Behavior: No Danger to Others: No Duty to Warn:no Physical Aggression / Violence:No  Access to Firearms a concern: No  Gang Involvement:No   Subjective: Patient was engaged and cooperative throughout the session using time effectively to discuss thoughts and feelings. Patient reports that her weekend was good, got lots of sleep and support from her husband.Patient was receptive to feedback and intervention from LCSW.   Interventions: Cognitive Behavioral Therapy and Client centered Triple P  Established psychological safety. Checked in with patient regarding their week. Set session agenda. LCSW and patient discussed having a couples session to focus on communication. Explored patient's parenting challenges providing Triple P intervention, including positive praise and planned ignoring. LCSW discussed with patient options to focus on TP at next session. Provided support through active listening, validation of feelings, and highlighted patient's strengths.   Diagnosis:   ICD-10-CM   1. Generalized anxiety disorder  F41.1      Plan: Patient's goal of treatment is how to not  get so aggravated so fast, not feel so anxious all the time and have so many worries, learn to not stress so much.  Treatment Target: Understand the relationship between thoughts, emotions, and behaviors  Psychoeducation on CBTs model   Oriented the client to the therapeutic approach Teach the connection between thoughts, emotions, and behaviors  Treatment Target: Increase realistic balanced thinking -to learn how to replace thinking with thoughts that are more accurate or helpful Explore patient's thoughts, beliefs, automatic thoughts, assumptions  Identify and replace unhelpful thinking patterns (upsetting ideas, self-talk and mental images) Process distress and allow for emotional release  Questioning and challenging thoughts Cognitive reappraisal  Restructuring, Socratic questioning  Provide psychoeducation on core beliefs, explore, and assist patient in identifying core beliefs  Treatment Target: Increase psychological flexibility and create a more meaningful life Mindfulness Acceptance practices  Values clarification   Increase meaningful and pleasurable events/activities  Cognitive Diffusion exercises  Treatment Target: Increase emotional regulation  Coping skills to manage anxiety   Teach distress tolerance techniques - "what helps me"  Opposite action PLEASE  skills or self-care skills  Self-soothing  Cope ahead skills - imagery, rehearsal, problem-solving, exposure   Assertiveness communication   Future Appointments  Date Time Provider Department Center  05/17/2023  8:20 AM Kathreen Cosier, LCSW [facility] None     Kathreen Cosier, LCSW

## 2023-05-15 ENCOUNTER — Ambulatory Visit: Payer: Medicaid Other | Admitting: Licensed Clinical Social Worker

## 2023-05-17 ENCOUNTER — Ambulatory Visit: Payer: Medicaid Other | Admitting: Licensed Clinical Social Worker

## 2023-05-17 DIAGNOSIS — F411 Generalized anxiety disorder: Secondary | ICD-10-CM

## 2023-05-17 NOTE — Progress Notes (Signed)
Counselor/Therapist Progress Note  Patient ID: Felicia Jones, MRN: [id number],    Date: 05/17/2023  Time Spent: 45 minutes    Treatment Type: Psychotherapy Patient's husband was in session per her request   Reported Symptoms:  Mild anxiety, anxious thoughts, overwhelm   Mental Status Exam:  Appearance:   Casual and Neat     Behavior:  Appropriate, Sharing, and Motivated  Motor:  Normal  Speech/Language:   Clear and Coherent and Normal Rate  Affect:  Appropriate, Congruent, and Full Range  Mood:  normal  Thought process:  normal  Thought content:    WNL  Sensory/Perceptual disturbances:    WNL  Orientation:  oriented to person, place, time/date, situation, and day of week  Attention:  Good  Concentration:  Good  Memory:  WNL  Fund of knowledge:   Good  Insight:    Fair  Judgment:   Fair  Impulse Control:  Fair   Risk Assessment: Danger to Self:  No Self-injurious Behavior: No Danger to Others: No Duty to Warn:no Physical Aggression / Violence:No  Access to Firearms a concern: No  Gang Involvement:No   Subjective: Patient was engaged and cooperative throughout the session using time effectively to discuss thoughts and feelings and communication. Patient and her husband agree that the session was helpful and agreed to homework task.     Interventions: Cognitive Behavioral Therapy and Client Centered  LCSW established psychological safety. LCSW met with patient to identify needs related to stressors and functioning, and assess and monitor for signs and symptoms of depression and anxiety, and assess safety. Checked in with patient regarding how they have been doing since the last follow-up session and set session agenda to include focus on communication within the marriage. LCSW explored communication challenges identifying points of intervention. Discussed listening to understand and to resolve problems without reacting defensively. Encouraged the couple to identify ways to  connect over the next two weeks.  Sent patient Communication resources for couples to be reviewed as homework. Provided support through active listening, validation of feelings, and highlighted patient's strengths.  Diagnosis:   ICD-10-CM   1. Generalized anxiety disorder  F41.1       Plan: Patient's goal of treatment is how to not get so aggravated so fast, not feel so anxious all the time and have so many worries, learn to not stress so much.  Treatment Target: Understand the relationship between thoughts, emotions, and behaviors  Psychoeducation on CBTs model   Oriented the client to the therapeutic approach Teach the connection between thoughts, emotions, and behaviors  Treatment Target: Increase realistic balanced thinking -to learn how to replace thinking with thoughts that are more accurate or helpful Explore patient's thoughts, beliefs, automatic thoughts, assumptions  Identify and replace unhelpful thinking patterns (upsetting ideas, self-talk and mental images) Process distress and allow for emotional release  Questioning and challenging thoughts Cognitive reappraisal  Restructuring, Socratic questioning  Provide psychoeducation on core beliefs, explore, and assist patient in identifying core beliefs  Treatment Target: Increase psychological flexibility and create a more meaningful life Mindfulness Acceptance practices  Values clarification   Increase meaningful and pleasurable events/activities  Cognitive Diffusion exercises  Treatment Target: Increase emotional regulation  Coping skills to manage anxiety   Teach distress tolerance techniques - "what helps me"  Opposite action PLEASE  skills or self-care skills  Self-soothing  Cope ahead skills - imagery, rehearsal, problem-solving, exposure   Assertiveness communication  Future Appointments  Date Time Provider Department Center  05/29/2023  9:00  AM Kathreen Cosier, LCSW [facility] None    Kathreen Cosier, [place]

## 2023-05-29 ENCOUNTER — Ambulatory Visit: Payer: Medicaid Other | Admitting: Licensed Clinical Social Worker

## 2023-06-05 ENCOUNTER — Ambulatory Visit: Payer: Medicaid Other | Admitting: Licensed Clinical Social Worker

## 2023-06-18 ENCOUNTER — Ambulatory Visit: Payer: Medicaid Other | Admitting: Licensed Clinical Social Worker

## 2023-06-19 ENCOUNTER — Ambulatory Visit: Payer: Medicaid Other | Admitting: Licensed Clinical Social Worker

## 2023-07-03 ENCOUNTER — Ambulatory Visit: Payer: Medicaid Other | Admitting: Licensed Clinical Social Worker

## 2023-07-10 ENCOUNTER — Ambulatory Visit: Payer: Medicaid Other | Admitting: Licensed Clinical Social Worker

## 2023-08-07 ENCOUNTER — Ambulatory Visit: Payer: Medicaid Other | Admitting: Licensed Clinical Social Worker

## 2023-08-07 DIAGNOSIS — F411 Generalized anxiety disorder: Secondary | ICD-10-CM

## 2023-08-07 NOTE — Progress Notes (Signed)
 Counselor/Therapist Progress Note  Patient ID: Felicia Jones, MRN: [id number],    Date: 08/07/2023  Time Spent: 45 total minutes. I spent 40 minutes on real-time audio with the patient on the date of service. I spent an additional 5 minutes on pre- and post-visit activities on the date of service including collateral, chart review, team discussion, and documentation.    Treatment Type: Psychotherapy  Reported Symptoms:  Mood mild instability up and down irritability, overwhelm, low mood at times    Mental Status Exam:  Appearance:   NA     Behavior:  Appropriate, Sharing, and Motivated  Motor:  NA  Speech/Language:   Clear and Coherent and Normal Rate  Affect:  NA  Mood:  normal  Thought process:  normal  Thought content:    WNL  Sensory/Perceptual disturbances:    WNL  Orientation:  oriented to person, place, time/date, situation, and day of week  Attention:  Good  Concentration:  Good  Memory:  WNL  Fund of knowledge:   Good  Insight:    Good  Judgment:   Good  Impulse Control:  Good   Risk Assessment: Danger to Self:  No Self-injurious Behavior: No Danger to Others: No Duty to Warn:no Physical Aggression / Violence:No  Access to Firearms a concern: No  Gang Involvement:No   Subjective: Patient was engaged and cooperative throughout the session using time effectively to discuss thoughts and feelings. Patient reports that her mood has been up and down and she has been overwhelmed with balancing parenting duties, and marital challenges. Patient was receptive to feedback and intervention from LCSW and voices continued motivation for treatment.  Interventions: Cognitive Behavioral Therapy and Client Centered  LCSW established psychological safety. LCSW met with patient to identify needs related to stressors and functioning, and assess and monitor for signs and symptoms of anxiety and depression, and assess safety. Checked in with patient regarding how they have been doing  since the last follow-up session. LCSW explored patient's perception of marital challenges, validating her feelings of frustration and identifying origins of these feelings within the relationship. LCSW reframed unhelpful thoughts and discussed assertiveness communication skills.    Diagnosis:   ICD-10-CM   1. Generalized anxiety disorder  F41.1      Plan:  Patient's goal of treatment is how to not get so aggravated so fast, not feel so anxious all the time and have so many worries, learn to not stress so much.  Treatment Target: Understand the relationship between thoughts, emotions, and behaviors  Psychoeducation on CBTs model   Oriented the client to the therapeutic approach Teach the connection between thoughts, emotions, and behaviors  Treatment Target: Increase realistic balanced thinking -to learn how to replace thinking with thoughts that are more accurate or helpful Explore patient's thoughts, beliefs, automatic thoughts, assumptions  Identify and replace unhelpful thinking patterns (upsetting ideas, self-talk and mental images) Process distress and allow for emotional release  Questioning and challenging thoughts Cognitive reappraisal  Restructuring, Socratic questioning  Provide psychoeducation on core beliefs, explore, and assist patient in identifying core beliefs  Treatment Target: Increase psychological flexibility and create a more meaningful life Mindfulness Acceptance practices  Values clarification   Increase meaningful and pleasurable events/activities  Cognitive Diffusion exercises  Treatment Target: Increase emotional regulation  Coping skills to manage anxiety   Teach distress tolerance techniques - "what helps me"  Opposite action PLEASE  skills or self-care skills  Self-soothing  Cope ahead skills - imagery, rehearsal, problem-solving, exposure   Assertiveness  communication   Future Appointments  Date Time Provider Department Center  08/14/2023  1:00 PM  Ellender [place]ma, LCSW [facility] None     [place], [place]

## 2023-08-14 ENCOUNTER — Ambulatory Visit: Payer: Medicaid Other | Admitting: Licensed Clinical Social Worker

## 2023-08-14 DIAGNOSIS — F411 Generalized anxiety disorder: Secondary | ICD-10-CM

## 2023-08-14 NOTE — Progress Notes (Signed)
Counselor/Therapist Progress Note  Patient ID: Felicia Jones, MRN: [id number],    Date: 08/14/2023  Time Spent:35 total minutes. I spent 30 minutes on real-time audio with the patient on the date of service. I spent an additional 5 minutes on pre- and post-visit activities on the date of service including collateral, chart review, team discussion, and documentation.   Treatment Type: Psychotherapy  Reported Symptoms:  overall mood stability; mild anxiety, mild irritability   Mental Status Exam:  Appearance:   NA     Behavior:  Appropriate, Sharing, and Motivated  Motor:  NA  Speech/Language:   Clear and Coherent and Normal Rate  Affect:  NA  Mood:  normal  Thought process:  normal  Thought content:    WNL  Sensory/Perceptual disturbances:    WNL  Orientation:  oriented to person, place, time/date, situation, and day of week  Attention:  Good  Concentration:  Good  Memory:  WNL  Fund of knowledge:   Good  Insight:    Good  Judgment:   Good  Impulse Control:  Good   Risk Assessment: Danger to Self:  No Self-injurious Behavior: No Danger to Others: No Duty to Warn:no Physical Aggression / Violence:No  Access to Firearms a concern: No  Gang Involvement:No   Subjective: Patient was engaged and cooperative throughout the session using time effectively to discuss thoughts and feelings. Patient reports she has been overall good, just one day she experienced conflict with husband. She reports doing well with taking time for herself when she is feeling too emotional (irritated). Patient was receptive to feedback and intervention from LCSW and voices continued motivation for treatment.    Interventions: Cognitive Behavioral Therapy and Client Centered  LCSW established psychological safety. LCSW met with patient to identify needs related to stressors and functioning, and assess and monitor for signs and symptoms anxiety and depression, and assess safety. Checked in with patient  regarding how they have been doing since the last follow-up session. LCSW assisted patient in processing their thoughts and emotions regarding marital challenges. LCSW validated patient's feelings of frustration, reframed patient's thoughts leading to increased distress, and discussed options to improve communication.  Provided support through active listening, validation of feelings, and highlighted patient's strengths.  Diagnosis:   ICD-10-CM   1. Generalized anxiety disorder  F41.1      Plan:   Patient's goal of treatment is how to not get so aggravated so fast, not feel so anxious all the time and have so many worries, learn to not stress so much.  Treatment Target: Understand the relationship between thoughts, emotions, and behaviors  Psychoeducation on CBTs model   Oriented the client to the therapeutic approach Teach the connection between thoughts, emotions, and behaviors  Treatment Target: Increase realistic balanced thinking -to learn how to replace thinking with thoughts that are more accurate or helpful Explore patient's thoughts, beliefs, automatic thoughts, assumptions  Identify and replace unhelpful thinking patterns (upsetting ideas, self-talk and mental images) Process distress and allow for emotional release  Questioning and challenging thoughts Cognitive reappraisal  Restructuring, Socratic questioning  Provide psychoeducation on core beliefs, explore, and assist patient in identifying core beliefs  Treatment Target: Increase psychological flexibility and create a more meaningful life Mindfulness Acceptance practices  Values clarification   Increase meaningful and pleasurable events/activities  Cognitive Diffusion exercises  Treatment Target: Increase emotional regulation  Coping skills to manage anxiety   Teach distress tolerance techniques - "what helps me"  Opposite action PLEASE  skills or self-care  skills  Self-soothing  Cope ahead skills - imagery, rehearsal,  problem-solving, exposure   Assertiveness communication   Future Appointments  Date Time Provider Department Center  08/21/2023  9:00 AM Kathreen Cosier, LCSW [facility] None    [place]athreen Cos[place], [place]

## 2023-08-21 ENCOUNTER — Ambulatory Visit: Payer: Medicaid Other | Admitting: Licensed Clinical Social Worker

## 2023-08-21 DIAGNOSIS — F411 Generalized anxiety disorder: Secondary | ICD-10-CM

## 2023-08-21 NOTE — Progress Notes (Signed)
 Counselor/Therapist Progress Note  Patient ID: Felicia Jones, MRN: [id number],    Date: 08/21/2023  Time Spent: 52 total minutes. I spent 47 minutes on real-time audio and video the patient on the date of service. I spent an additional 5  minutes on pre- and post-visit activities on the date of service including collateral, chart review, team discussion, and documentation.     Treatment Type: Psychotherapy  Reported Symptoms:  Anxiety, anxious thoughts; low mood   Mental Status Exam:  Appearance:   Casual and Neat     Behavior:  Appropriate, Sharing, and Motivated  Motor:  Normal  Speech/Language:   Clear and Coherent and Normal Rate  Affect:  Appropriate, Congruent, and Full Range  Mood:  normal  Thought process:  normal  Thought content:    WNL  Sensory/Perceptual disturbances:    WNL  Orientation:  oriented to person, place, time/date, situation, and day of week  Attention:  Good  Concentration:  Good  Memory:  WNL  Fund of knowledge:   Good  Insight:    Good  Judgment:   Good  Impulse Control:  Good   Risk Assessment: Danger to Self:  No Self-injurious Behavior: No Danger to Others: No Duty to Warn:no Physical Aggression / Violence:No  Access to Firearms a concern: No  Gang Involvement:No   Subjective: Patient was engaged and cooperative throughout the session using time effectively to discuss thoughts and feelings. Patient reports marital challenges related to finances and values differences leading to low mood and anxiety. Patient voices continued motivation for treatment and benefit from regular sessions. She was receptive to feedback and intervention.    Interventions: Cognitive Behavioral Therapy and Client Centered  LCSW established psychological safety. LCSW met with patient to identify needs related to stressors and functioning, and assess and monitor for signs and symptoms of anxiety and depressed mood, and assess safety. Checked in with patient regarding how  they have been doing since the last follow-up session. LCSW assisted patient in processing their thoughts and emotions regarding marital challenges. LCSW validated patient's feelings of frustration and identified origins of these feelings within the marriage. LCSW assisted patient with perspective taking, reviewed values, and effective communication with patient.    Diagnosis:   ICD-10-CM   1. Generalized anxiety disorder  F41.1      Plan: Patient's goal of treatment is how to not get so aggravated so fast, not feel so anxious all the time and have so many worries, learn to not stress so much.  Treatment Target: Understand the relationship between thoughts, emotions, and behaviors  Psychoeducation on CBTs model   Oriented the client to the therapeutic approach Teach the connection between thoughts, emotions, and behaviors  Treatment Target: Increase realistic balanced thinking -to learn how to replace thinking with thoughts that are more accurate or helpful Explore patient's thoughts, beliefs, automatic thoughts, assumptions  Identify and replace unhelpful thinking patterns (upsetting ideas, self-talk and mental images) Process distress and allow for emotional release  Questioning and challenging thoughts Cognitive reappraisal  Restructuring, Socratic questioning  Provide psychoeducation on core beliefs, explore, and assist patient in identifying core beliefs  Treatment Target: Increase psychological flexibility and create a more meaningful life Mindfulness Acceptance practices  Values clarification   Increase meaningful and pleasurable events/activities  Cognitive Diffusion exercises  Treatment Target: Increase emotional regulation  Coping skills to manage anxiety   Teach distress tolerance techniques - "what helps me"  Opposite action PLEASE  skills or self-care skills  Self-soothing  Cope  ahead skills - imagery, rehearsal, problem-solving, exposure   Assertiveness communication    Future Appointments  Date Time Provider Department Center  08/27/2023  9:00 AM Kathreen Cosier, LCSW [facility] None     [place]athreen [place], [place]

## 2023-08-27 ENCOUNTER — Ambulatory Visit: Payer: Medicaid Other | Admitting: Licensed Clinical Social Worker

## 2023-08-27 DIAGNOSIS — F411 Generalized anxiety disorder: Secondary | ICD-10-CM

## 2023-08-27 NOTE — Progress Notes (Signed)
 Counselor/Therapist Progress Note  Patient ID: Felicia Jones, MRN: [id number],    Date: 08/27/2023  Time Spent: 52 total minutes. I spent 47 minutes on real-time audio with the patient on the date of service. I spent an additional 5 minutes on pre- and post-visit activities on the date of service including collateral, chart review, team discussion, and documentation.    Treatment Type: Individual Therapy  Reported Symptoms:  Current mood is "good", mild anxiety and low mood over the past week   Mental Status Exam:  Appearance:   NA     Behavior:  Appropriate, Sharing, and Motivated  Motor:  NA  Speech/Language:   Clear and Coherent and Normal Rate  Affect:  NA  Mood:  normal  Thought process:  normal  Thought content:    WNL  Sensory/Perceptual disturbances:    WNL  Orientation:  oriented to person, place, time/date, situation, and day of week  Attention:  Good  Concentration:  Good  Memory:  WNL  Fund of knowledge:   Good  Insight:    Good  Judgment:   Good  Impulse Control:  Good   Risk Assessment: Danger to Self:  No Self-injurious Behavior: No Danger to Others: No Duty to Warn:no Physical Aggression / Violence:No  Access to Firearms a concern: No  Gang Involvement:No   Subjective: Patient was engaged and cooperative throughout the session using time effectively to discuss thoughts and feelings. Patient reports continued marital challenges, but has had difficult conversations and practiced communicating her needs. Patient voices continued motivation for treatment and remains motivated to decrease anxiety and depressive symptoms and reports benefit of regular sessions in addressing these symptoms.   Interventions: Cognitive Behavioral Therapy and Client Centered  LCSW established psychological safety. LCSW met with patient to identify needs related to stressors and functioning, and assess and monitor for signs and symptoms of anxiety and depression, and assess safety.  Checked in with patient regarding how they have been doing since the last follow-up session. Checked in about previous session regarding marital challenges and open communication. Validated, patient's feeling of frustration and discouragement and explored options to increase patient's self-care. Provided support through active listening, validation of feelings, and highlighted patient's strengths.  Diagnosis:   ICD-10-CM   1. Generalized anxiety disorder  F41.1      Plan: Patient's goal of treatment is how to not get so aggravated so fast, not feel so anxious all the time and have so many worries, learn to not stress so much.  Treatment Target: Understand the relationship between thoughts, emotions, and behaviors  Psychoeducation on CBTs model   Oriented the client to the therapeutic approach Teach the connection between thoughts, emotions, and behaviors  Treatment Target: Increase realistic balanced thinking -to learn how to replace thinking with thoughts that are more accurate or helpful Explore patient's thoughts, beliefs, automatic thoughts, assumptions  Identify and replace unhelpful thinking patterns (upsetting ideas, self-talk and mental images) Process distress and allow for emotional release  Questioning and challenging thoughts Cognitive reappraisal  Restructuring, Socratic questioning  Provide psychoeducation on core beliefs, explore, and assist patient in identifying core beliefs  Treatment Target: Increase psychological flexibility and create a more meaningful life Mindfulness Acceptance practices  Values clarification   Increase meaningful and pleasurable events/activities  Cognitive Diffusion exercises  Treatment Target: Increase emotional regulation  Coping skills to manage anxiety   Teach distress tolerance techniques - "what helps me"  Opposite action PLEASE  skills or self-care skills  Self-soothing  Cope ahead  skills - imagery, rehearsal, problem-solving, exposure    Assertiveness communication   Future Appointments  Date Time Provider Department Center  09/06/2023  1:00 PM Kathreen Cosier, LCSW [facility] None     [place]athreen Cosier, [place]

## 2023-09-06 ENCOUNTER — Ambulatory Visit: Payer: Medicaid Other | Admitting: Licensed Clinical Social Worker

## 2023-09-06 DIAGNOSIS — F411 Generalized anxiety disorder: Secondary | ICD-10-CM

## 2023-09-06 NOTE — Progress Notes (Signed)
 Counselor/Therapist Progress Note  Patient ID: Felicia Jones, MRN: [id number],    Date: 09/06/2023  Time Spent: 43 total minutes. I spent 38 minutes on real-time audio with the patient on the date of service. I spent an additional 5 minutes on pre- and post-visit activities on the date of service including collateral, chart review, team discussion, and documentation.   Treatment Type: Psychotherapy  Reported Symptoms:  Overall mood stability  Mental Status Exam:  Appearance:   NA     Behavior:  Appropriate, Sharing, and Motivated  Motor:  NA   Speech/Language:   Clear and Coherent and Normal Rate  Affect:  NA  Mood:  normal  Thought process:  normal  Thought content:    WNL  Sensory/Perceptual disturbances:    WNL  Orientation:  oriented to person, place, time/date, situation, and day of week  Attention:  Good  Concentration:  Good  Memory:  WNL  Fund of knowledge:   Good  Insight:    Good  Judgment:   Good  Impulse Control:  Good   Risk Assessment: Danger to Self:  No Self-injurious Behavior: No Danger to Others: No Duty to Warn:no Physical Aggression / Violence:No  Access to Firearms a concern: No  Gang Involvement:No   Subjective: Patient was engaged and cooperative throughout the session using time effectively to discuss thoughts and feelings. Patient reports overall her mood has been okay and had a nice birthday and continues to have marital challenges. Patient reports that she has been trying to communicate effectively with her husband. Patient voices continued motivation for treatment reporting she will try to be more aware of judgement and attempt curiosity and reports benefit of regular sessions in addressing symptoms.   Interventions: Cognitive Behavioral Therapy and Client Centered  LCSW established psychological safety. LCSW met with patient to identify needs related to stressors and functioning, and assess and monitor for signs and symptoms of anxiety and  depressed mood, and assess safety. Checked in with patient regarding how they have been doing since the last follow-up session. LCSW assisted patient in processing their thoughts and emotions regarding interactions with her husband over her birthday outing, providing active listening and validation. LCSW taught patient about judgement and curiosity, and encouraged patient to notice her thoughts and see if she can be more curious.    Diagnosis:   ICD-10-CM   1. Generalized anxiety disorder  F41.1      Plan: Patient's goal of treatment is how to not get so aggravated so fast, not feel so anxious all the time and have so many worries, learn to not stress so much.  Treatment Target: Understand the relationship between thoughts, emotions, and behaviors  Psychoeducation on CBTs model   Oriented the client to the therapeutic approach Teach the connection between thoughts, emotions, and behaviors  Treatment Target: Increase realistic balanced thinking -to learn how to replace thinking with thoughts that are more accurate or helpful Explore patient's thoughts, beliefs, automatic thoughts, assumptions  Identify and replace unhelpful thinking patterns (upsetting ideas, self-talk and mental images) Process distress and allow for emotional release  Questioning and challenging thoughts Cognitive reappraisal  Restructuring, Socratic questioning  Provide psychoeducation on core beliefs, explore, and assist patient in identifying core beliefs  Treatment Target: Increase psychological flexibility and create a more meaningful life Mindfulness Acceptance practices  Values clarification   Increase meaningful and pleasurable events/activities  Cognitive Diffusion exercises  Treatment Target: Increase emotional regulation  Coping skills to manage anxiety   Teach distress tolerance  techniques - "what helps me"  Opposite action PLEASE  skills or self-care skills  Self-soothing  Cope ahead skills - imagery,  rehearsal, problem-solving, exposure   Assertiveness communication  Future Appointments  Date Time Provider Department Center  09/13/2023 11:20 AM Kathreen Cosier, LCSW [facility] None    Kathreen Cosier, LCSW

## 2023-09-13 ENCOUNTER — Ambulatory Visit: Admitting: Licensed Clinical Social Worker

## 2023-09-17 ENCOUNTER — Ambulatory Visit: Admitting: Licensed Clinical Social Worker

## 2023-09-17 DIAGNOSIS — F411 Generalized anxiety disorder: Secondary | ICD-10-CM

## 2023-09-17 NOTE — Progress Notes (Signed)
 Counselor/Therapist Progress Note  Patient ID: Felicia Jones, MRN: [id number],    Date: 09/17/2023  Time Spent: 50 total minutes. I spent 45 minutes on real-time audio and video with the patient on the date of service. I spent an additional 5 minutes on pre- and post-visit activities on the date of service including collateral, chart review, team discussion, and documentation.     Treatment Type: Psychotherapy  Reported Symptoms:  Overall stable mood; mild anxiety, anxious thoughts  Mental Status Exam:  Appearance:   Casual and Well Groomed     Behavior:  Appropriate, Sharing, and Motivated  Motor:  Normal  Speech/Language:   Clear and Coherent and Normal Rate  Affect:  Appropriate, Congruent, and Full Range  Mood:  normal  Thought process:  normal  Thought content:    WNL  Sensory/Perceptual disturbances:    WNL  Orientation:  oriented to person, place, time/date, situation, and day of week  Attention:  Good  Concentration:  Good  Memory:  WNL  Fund of knowledge:   Good  Insight:    Good  Judgment:   Good  Impulse Control:  Good   Risk Assessment: Danger to Self:  No Self-injurious Behavior: No Danger to Others: No Duty to Warn:no Physical Aggression / Violence:No  Access to Firearms a concern: No  Gang Involvement:No   Subjective: Patient reports overall mood stability. The focus of the session was martial challenges and challenges with navigating extended family that lead to distress. She reports that communication with husband has been improving and he has been more helpful. Patient was receptive to feedback and intervention from LCSW and actively and effectively participated throughout the session. Patient is likely to benefit from future treatment because they remain motivated to decrease anxiety and depressive symptoms and reports benefit of regular sessions in addressing these symptoms.     Interventions: Cognitive Behavioral Therapy and Client Centered  LCSW  established psychological safety. LCSW met with patient to identify needs related to stressors and functioning, and assess and monitor for signs and symptoms of anxiety and depression, and assess safety. Checked in with patient regarding how they have been doing since the last follow-up session. LCSW assisted patient in processing their thoughts and emotions regarding marital challenges and challenges related to in-laws.  LCSW validated patient's feelings of ressentiment and frustration and identified origins of these challenges. LCSW assisted patient in identifying her wants and needs related to her values and discussed options to communicate these wants and needs. Provided support through active listening, validation of feelings, and highlighted patient's strengths.  Diagnosis:   ICD-10-CM   1. Generalized anxiety disorder  F41.1      Plan: Patient's goal of treatment is how to not get so aggravated so fast, not feel so anxious all the time and have so many worries, learn to not stress so much.  Treatment Target: Understand the relationship between thoughts, emotions, and behaviors  Psychoeducation on CBTs model   Oriented the client to the therapeutic approach Teach the connection between thoughts, emotions, and behaviors  Treatment Target: Increase realistic balanced thinking -to learn how to replace thinking with thoughts that are more accurate or helpful Explore patient's thoughts, beliefs, automatic thoughts, assumptions  Identify and replace unhelpful thinking patterns (upsetting ideas, self-talk and mental images) Process distress and allow for emotional release  Questioning and challenging thoughts Cognitive reappraisal  Restructuring, Socratic questioning  Provide psychoeducation on core beliefs, explore, and assist patient in identifying core beliefs  Treatment Target: Increase psychological  flexibility and create a more meaningful life Mindfulness Acceptance practices  Values  clarification   Increase meaningful and pleasurable events/activities  Cognitive Diffusion exercises  Treatment Target: Increase emotional regulation  Coping skills to manage anxiety   Teach distress tolerance techniques - "what helps me"  Opposite action PLEASE  skills or self-care skills  Self-soothing  Cope ahead skills - imagery, rehearsal, problem-solving, exposure   Assertiveness communication  Future Appointments  Date Time Provider Department Center  09/24/2023 11:00 AM Kathreen Cosier, LCSW [facility] None     Kathreen Cosier, [place]

## 2023-09-24 ENCOUNTER — Ambulatory Visit: Admitting: Licensed Clinical Social Worker

## 2023-10-04 ENCOUNTER — Ambulatory Visit: Admitting: Licensed Clinical Social Worker

## 2023-10-04 DIAGNOSIS — F411 Generalized anxiety disorder: Secondary | ICD-10-CM

## 2023-10-04 NOTE — Progress Notes (Signed)
 Counselor/Therapist Progress Note  Patient ID: Felicia Jones, MRN: [id number],    Date: 10/04/2023  Time Spent: 45 total minutes. I spent 40 minutes on real-time audio and video with the patient on the date of service. I spent an additional 5 minutes on pre- and post-visit activities on the date of service including collateral, chart review, team discussion, and documentation.   Treatment Type: Psychotherapy  Reported Symptoms:  Anxiety, anxious thoughts; intermittent low mood  Mental Status Exam:  Appearance:   Casual and Meticulous     Behavior:  Appropriate, Sharing, and Motivated  Motor:  Normal  Speech/Language:   Clear and Coherent and Normal Rate  Affect:  Appropriate, Congruent, and Full Range  Mood:  normal  Thought process:  normal  Thought content:    WNL  Sensory/Perceptual disturbances:    WNL  Orientation:  oriented to person, place, time/date, situation, and day of week  Attention:  Good  Concentration:  Good  Memory:  WNL  Fund of knowledge:   Good  Insight:    Good  Judgment:   Good  Impulse Control:  Good   Risk Assessment: Danger to Self:  No Self-injurious Behavior: No Danger to Others: No Duty to Warn:no Physical Aggression / Violence:No  Access to Firearms a concern: No  Gang Involvement:No   Subjective: Patient reports that her mood has been low "a little bit", she has been feeling low when she isn't able to get out and do the things she enjoys. Patient also shares she has been feeling discouraged about marital challenges. Patient was engaged and cooperative throughout the session using time effectively to discuss thoughts and feelings primarily focused on marital challenges. Patient voices continued motivation for treatment.  Interventions: Cognitive Behavioral Therapy and Client Centered  LCSW established psychological safety. LCSW met with patient to identify needs related to stressors and functioning, and assess and monitor for signs and symptoms of  anxiety and depression, and assess safety. Checked in with patient regarding how they have been doing since the last follow-up session. LCSW assisted patient in processing their thoughts and emotions regarding marital conflict. Validated patient's frustrations, facilitated perspective taking, reviewed communication strategies, and discussed next session to focus on Mindfulness Meditations and sent patient guided meditation links.   Diagnosis:   ICD-10-CM   1. Generalized anxiety disorder  F41.1      Plan:  Patient's goal of treatment is how to not get so aggravated so fast, not feel so anxious all the time and have so many worries, learn to not stress so much.  Treatment Target: Understand the relationship between thoughts, emotions, and behaviors  Psychoeducation on CBTs model   Oriented the client to the therapeutic approach Teach the connection between thoughts, emotions, and behaviors  Treatment Target: Increase realistic balanced thinking -to learn how to replace thinking with thoughts that are more accurate or helpful Explore patient's thoughts, beliefs, automatic thoughts, assumptions  Identify and replace unhelpful thinking patterns (upsetting ideas, self-talk and mental images) Process distress and allow for emotional release  Questioning and challenging thoughts Cognitive reappraisal  Restructuring, Socratic questioning  Provide psychoeducation on core beliefs, explore, and assist patient in identifying core beliefs  Treatment Target: Increase psychological flexibility and create a more meaningful life Mindfulness Acceptance practices  Values clarification   Increase meaningful and pleasurable events/activities  Cognitive Diffusion exercises  Treatment Target: Increase emotional regulation  Coping skills to manage anxiety   Teach distress tolerance techniques - "what helps me"  Opposite action PLEASE  skills or self-care skills  Self-soothing  Cope ahead skills - imagery,  rehearsal, problem-solving, exposure   Assertiveness communication  Future Appointments  Date Time Provider Department Center  10/09/2023  9:00 AM Kathreen Cosier, LCSW [facility] None    Kathreen Cosier, [place]

## 2023-10-09 ENCOUNTER — Ambulatory Visit: Admitting: Licensed Clinical Social Worker

## 2023-10-16 ENCOUNTER — Ambulatory Visit: Admitting: Licensed Clinical Social Worker

## 2023-10-16 DIAGNOSIS — F411 Generalized anxiety disorder: Secondary | ICD-10-CM

## 2023-10-16 NOTE — Progress Notes (Signed)
 Counselor/Therapist Progress Note  Patient ID: Felicia Jones, MRN: [id number],    Date: 10/16/2023  Time Spent: 53 total minutes. I spent 48 minutes on real-time audio and video with the patient on the date of service. I spent an additional 5 minutes on pre- and post-visit activities on the date of service including collateral, chart review, team discussion, and documentation.     Treatment Type: Psychotherapy  Reported Symptoms:  Overall mood stability; mild anxiety, anxious thoughts  Mental Status Exam:  Appearance:   Casual and Neat     Behavior:  Appropriate, Sharing, and Motivated  Motor:  Normal  Speech/Language:   Clear and Coherent and Normal Rate  Affect:  Appropriate, Congruent, and Full Range  Mood:  normal  Thought process:  normal  Thought content:    WNL  Sensory/Perceptual disturbances:    WNL  Orientation:  oriented to person, place, time/date, situation, and day of week  Attention:  Good  Concentration:  Good  Memory:  WNL  Fund of knowledge:   Good  Insight:    Good  Judgment:   Good  Impulse Control:  Good   Risk Assessment: Danger to Self:  No Self-injurious Behavior: No Danger to Others: No Duty to Warn:no Physical Aggression / Violence:No  Access to Firearms a concern: No  Gang Involvement:No   Subjective: Patient shares that last week she and her husband had a big argument, but have been communicating better since. She reports that her mood has been okay and she endorsees mild anxiety. She shares that there are two main challenges in her marriage, one is an unresolved issue from the past before they were married and the other is challenges in navigating her husbands family. Patient was receptive to feedback and intervention from LCSW and actively and effectively participated throughout the session. Patient is likely to benefit from future treatment because they remain motivated to decrease symptoms and improve functioning and reports benefit of regular  sessions.  Interventions: Cognitive Behavioral Therapy and Client Centered  LCSW established psychological safety. LCSW met with patient to identify needs related to stressors and functioning, and assess and monitor for signs and symptoms of anxiety and depressed mood, and assess safety. Checked in with patient regarding how they have been doing since the last follow-up session.  LCSW assisted patient in processing their thoughts and emotions regarding conflict that she had with her husband last week and ongoing challenges related to difficulties in relationship with in-laws. LCSW intervened with positive regard and optimism to validate client's emotions, highlighted effective communication strategies, reframed unhelpful thoughts, and supported patient in exploring ways to increase her intentional use of self care.   Diagnosis:   ICD-10-CM   1. Generalized anxiety disorder  F41.1      Plan:  Patient's goal of treatment is how to not get so aggravated so fast, not feel so anxious all the time and have so many worries, learn to not stress so much.  Treatment Target: Understand the relationship between thoughts, emotions, and behaviors  Psychoeducation on CBTs model   Oriented the client to the therapeutic approach Teach the connection between thoughts, emotions, and behaviors  Treatment Target: Increase realistic balanced thinking -to learn how to replace thinking with thoughts that are more accurate or helpful Explore patient's thoughts, beliefs, automatic thoughts, assumptions  Identify and replace unhelpful thinking patterns (upsetting ideas, self-talk and mental images) Process distress and allow for emotional release  Questioning and challenging thoughts Cognitive reappraisal  Restructuring, Socratic questioning  Provide psychoeducation on core beliefs, explore, and assist patient in identifying core beliefs  Treatment Target: Increase psychological flexibility and create a more meaningful  life Mindfulness Acceptance practices  Values clarification   Increase meaningful and pleasurable events/activities  Cognitive Diffusion exercises  Treatment Target: Increase emotional regulation  Coping skills to manage anxiety   Teach distress tolerance techniques - "what helps me"  Opposite action PLEASE  skills or self-care skills  Self-soothing  Cope ahead skills - imagery, rehearsal, problem-solving, exposure   Assertiveness communication  Future Appointments  Date Time Provider Department Center  10/23/2023  9:00 AM Nyle Belling, LCSW [facility] None    Nyle Belling, [place]

## 2023-10-23 ENCOUNTER — Ambulatory Visit: Admitting: Licensed Clinical Social Worker

## 2023-10-23 DIAGNOSIS — F411 Generalized anxiety disorder: Secondary | ICD-10-CM

## 2023-10-23 NOTE — Progress Notes (Signed)
 Counselor/Therapist Progress Note  Patient ID: Felicia Jones, MRN: [id number],    Date: 10/23/2023  Time Spent: 50 total minutes. I spent 45 minutes on real-time audio and video with the patient on the date of service. I spent an additional 5 minutes on pre- and post-visit activities on the date of service including collateral, chart review, team discussion, and documentation.     Treatment Type: Psychotherapy  Reported Symptoms:  Stable mood; mild anxiety   Mental Status Exam:  Appearance:   Casual, Neat, and Well Groomed     Behavior:  Appropriate, Sharing, and Motivated  Motor:  Normal  Speech/Language:   Clear and Coherent and Normal Rate  Affect:  Appropriate, Congruent, and Full Range  Mood:  normal  Thought process:  normal  Thought content:    WNL  Sensory/Perceptual disturbances:    WNL  Orientation:  oriented to person, place, time/date, situation, and day of week  Attention:  Good  Concentration:  Good  Memory:  WNL  Fund of knowledge:   Good  Insight:    Good  Judgment:   Good  Impulse Control:  Good   Risk Assessment: Danger to Self:  No Self-injurious Behavior: No Danger to Others: No Duty to Warn:no Physical Aggression / Violence:No  Access to Firearms a concern: No  Gang Involvement:No   Subjective: The patient reports overall doing well. The session primarily focused on challenges related to in-laws, which have contributed to ongoing marital conflict. The patient was receptive to feedback and therapeutic interventions provided by the LCSW and actively engaged in the session. They demonstrated insight and effective participation throughout. The patient reports continued benefit from regular sessions and remains motivated to manage symptoms and improve overall functioning, indicating a positive prognosis for future treatment.  Interventions: Cognitive Behavioral Therapy and Client Centered  LCSW established psychological safety at the outset of the session.  The patient was engaged to identify current needs related to stressors and overall functioning. LCSW assessed and monitored for symptoms of anxiety, depressed mood, and safety concerns. A check-in was conducted regarding the patient's status since the last follow-up session. LCSW supported the patient in processing thoughts and emotions related to interpersonal challenges with in-laws, which have contributed to marital conflict. Interventions included validation of the patient's perspective and emotional experience through a stance of positive regard. The patient's core values related to family and marriage were explored. LCSW also provided psychoeducation and coaching on effective communication strategies and the establishment of healthy boundaries.  Diagnosis:   ICD-10-CM   1. Generalized anxiety disorder  F41.1      Plan: Patient's goal of treatment is how to not get so aggravated so fast, not feel so anxious all the time and have so many worries, learn to not stress so much.  Treatment Target: Understand the relationship between thoughts, emotions, and behaviors  Psychoeducation on CBTs model   Oriented the client to the therapeutic approach Teach the connection between thoughts, emotions, and behaviors  Treatment Target: Increase realistic balanced thinking -to learn how to replace thinking with thoughts that are more accurate or helpful Explore patient's thoughts, beliefs, automatic thoughts, assumptions  Identify and replace unhelpful thinking patterns (upsetting ideas, self-talk and mental images) Process distress and allow for emotional release  Questioning and challenging thoughts Cognitive reappraisal  Restructuring, Socratic questioning  Provide psychoeducation on core beliefs, explore, and assist patient in identifying core beliefs  Treatment Target: Increase psychological flexibility and create a more meaningful life Mindfulness Acceptance practices  Values  clarification    Increase meaningful and pleasurable events/activities  Cognitive Diffusion exercises  Treatment Target: Increase emotional regulation  Coping skills to manage anxiety   Teach distress tolerance techniques - "what helps me"  Opposite action PLEASE  skills or self-care skills  Self-soothing  Cope ahead skills - imagery, rehearsal, problem-solving, exposure   Assertiveness communication  Future Appointments  Date Time Provider Department Center  10/30/2023  9:00 AM Nyle Belling, LCSW [facility] None     Nyle Belling, LCSW

## 2023-10-30 ENCOUNTER — Ambulatory Visit: Admitting: Licensed Clinical Social Worker

## 2023-10-30 DIAGNOSIS — F411 Generalized anxiety disorder: Secondary | ICD-10-CM

## 2023-10-30 NOTE — Progress Notes (Signed)
 Counselor/Therapist Progress Note  Patient ID: Felicia Jones, MRN: [id number],    Date: 10/30/2023  Time Spent: 56 total minutes. I spent 46 minutes on real-time audio and video with the patient on the date of service. I spent an additional 10  minutes on pre- and post-visit activities on the date of service including collateral, chart review, team discussion, and documentation.     Treatment Type: Psychotherapy  Reported Symptoms:  Intermittent low mood, sadness; Anxiety, irritability, overwhelm   Mental Status Exam:  Appearance:   Casual     Behavior:  Appropriate, Sharing, and Motivated  Motor:  Normal  Speech/Language:   Clear and Coherent and Normal Rate  Affect:  Appropriate, Congruent, and Full Range  Mood:  normal  Thought process:  normal  Thought content:    WNL  Sensory/Perceptual disturbances:    WNL  Orientation:  oriented to person, place, time/date, situation, and day of week  Attention:  Good  Concentration:  Good  Memory:  WNL  Fund of knowledge:   Good  Insight:    Fair  Judgment:   Good  Impulse Control:  Good   Risk Assessment: Danger to Self:  No Self-injurious Behavior: No Danger to Others: No Duty to Warn:no Physical Aggression / Violence:No  Access to Firearms a concern: No  Gang Involvement:No   Subjective: Patient reports that she has been more aggravated over the past week, feeling more overwhelmed and sad at times, but is unsure why this is and can't remember what she does to help this. Patient was receptive to feedback and intervention from LCSW and actively and effectively participated throughout the session discussing thoughts, feelings, and coping skills. Patient is likely to benefit from future treatment because they remain motivated to decrease symptoms and improve functioning. Patient agreed to homework task of doing guided meditations with the links LCSW provided.   Interventions: Cognitive Behavioral Therapy, Mindfulness Meditation, and  Client Centered  LCSW established psychological safety and rapport with the patient. Met with the patient to assess current psychosocial stressors, functional status, and monitor for signs and symptoms of depression and anxiety. Conducted a Production manager. Checked in on the patient's well-being since the last follow-up session. LCSW utilized a strengths-based and supportive approach, providing validation and maintaining a stance of positive regard and optimism. Interventions focused on helping the patient identify and increase engagement in intentional self-care practices aligned with personal values, including regular meals, exercise, and sleep hygiene. Assisted patient in cognitive reframing of distressing thoughts contributing to emotional dysregulation. Provided psychoeducation on symptoms and patterns related to depression, anxiety, and the influence of the menstrual cycle on mood. Introduced mindfulness strategies and guided patient in identifying ways to integrate mindfulness into daily routines as well as guided meditations. Patient agreed to complete a daily guided mindfulness practice as part of the treatment plan.  Diagnosis:   ICD-10-CM   1. Generalized anxiety disorder  F41.1       Plan: Patient's goal of treatment is how to not get so aggravated so fast, not feel so anxious all the time and have so many worries, learn to not stress so much.  Treatment Target: Understand the relationship between thoughts, emotions, and behaviors  Psychoeducation on CBTs model   Oriented the client to the therapeutic approach Teach the connection between thoughts, emotions, and behaviors  Treatment Target: Increase realistic balanced thinking -to learn how to replace thinking with thoughts that are more accurate or helpful Explore patient's thoughts, beliefs, automatic thoughts, assumptions  Identify  and replace unhelpful thinking patterns (upsetting ideas, self-talk and mental images) Process  distress and allow for emotional release  Questioning and challenging thoughts Cognitive reappraisal  Restructuring, Socratic questioning  Provide psychoeducation on core beliefs, explore, and assist patient in identifying core beliefs  Treatment Target: Increase psychological flexibility and create a more meaningful life Mindfulness Acceptance practices  Values clarification   Increase meaningful and pleasurable events/activities  Cognitive Diffusion exercises  Treatment Target: Increase emotional regulation  Coping skills to manage anxiety   Teach distress tolerance techniques - "what helps me"  Opposite action PLEASE  skills or self-care skills  Self-soothing  Cope ahead skills - imagery, rehearsal, problem-solving, exposure   Assertiveness communication  Future Appointments  Date Time Provider Department Center  11/06/2023  9:00 AM Nyle Belling, LCSW [facility] None    Nyle Belling, LCSW

## 2023-11-06 ENCOUNTER — Ambulatory Visit: Admitting: Licensed Clinical Social Worker

## 2023-11-06 DIAGNOSIS — F411 Generalized anxiety disorder: Secondary | ICD-10-CM

## 2023-11-06 NOTE — Progress Notes (Signed)
 Counselor/Therapist Progress Note  Patient ID: Felicia Jones, MRN: [id number],    Date: 11/06/2023  Time Spent: 39 total minutes. I spent 34 minutes on real-time audio and video with the patient on the date of service. I spent an additional 5  minutes on pre- and post-visit activities on the date of service including collateral, chart review, team discussion, and documentation.     Treatment Type: Psychotherapy  Reported Symptoms:  "good"  Mental Status Exam:  Appearance:   Casual     Behavior:  Appropriate, Sharing, and Motivated  Motor:  Normal  Speech/Language:   Clear and Coherent and Normal Rate  Affect:  Appropriate, Congruent, and Full Range  Mood:  normal  Thought process:  normal  Thought content:    WNL  Sensory/Perceptual disturbances:    WNL  Orientation:  oriented to person, place, time/date, situation, and day of week  Attention:  Good  Concentration:  Good  Memory:  WNL  Fund of knowledge:   Good  Insight:    Good  Judgment:   Good  Impulse Control:  Good   Risk Assessment: Danger to Self:  No Self-injurious Behavior: No Danger to Others: No Duty to Warn:no Physical Aggression / Violence:No  Access to Firearms a concern: No  Gang Involvement:No   Subjective: Patient reports she has been good. She shared use of effective communication with her husband and feeling like it was productive.  Patient was engaged and cooperative throughout the session using time effectively to discuss thoughts and feelings. Patient agreed to schedule follow up appointment.   Interventions: Cognitive Behavioral Therapy and Client Centered  LCSW established psychological safety. LCSW met with patient to identify needs related to stressors and functioning, and assess symptoms of anxiety and depressed mood, and assess safety. Checked in with patient regarding how they have been doing since the last follow-up session.  LCSW assisted patient in processing their thoughts and emotions  regarding use of effective communication with husband leading to a productive outcome. LCSW provided active listening, reflective feedback and highlighted patient's use of effective communication.    Diagnosis:   ICD-10-CM   1. Generalized anxiety disorder  F41.1      Plan: Patient's goal of treatment is how to not get so aggravated so fast, not feel so anxious all the time and have so many worries, learn to not stress so much.  Treatment Target: Understand the relationship between thoughts, emotions, and behaviors  Psychoeducation on CBTs model   Oriented the client to the therapeutic approach Teach the connection between thoughts, emotions, and behaviors  Treatment Target: Increase realistic balanced thinking -to learn how to replace thinking with thoughts that are more accurate or helpful Explore patient's thoughts, beliefs, automatic thoughts, assumptions  Identify and replace unhelpful thinking patterns (upsetting ideas, self-talk and mental images) Process distress and allow for emotional release  Questioning and challenging thoughts Cognitive reappraisal  Restructuring, Socratic questioning  Provide psychoeducation on core beliefs, explore, and assist patient in identifying core beliefs  Treatment Target: Increase psychological flexibility and create a more meaningful life Mindfulness Acceptance practices  Values clarification   Increase meaningful and pleasurable events/activities  Cognitive Diffusion exercises  Treatment Target: Increase emotional regulation  Coping skills to manage anxiety   Teach distress tolerance techniques - "what helps me"  Opposite action PLEASE  skills or self-care skills  Self-soothing  Cope ahead skills - imagery, rehearsal, problem-solving, exposure   Assertiveness communication  Future Appointments  Date Time Provider Department Center  11/13/2023  9:00 AM Nyle Belling, LCSW [facility] None     Nyle Belling, [place]

## 2023-11-13 ENCOUNTER — Ambulatory Visit: Admitting: Licensed Clinical Social Worker

## 2023-11-13 DIAGNOSIS — F411 Generalized anxiety disorder: Secondary | ICD-10-CM

## 2023-11-13 NOTE — Progress Notes (Signed)
 Counselor/Therapist Progress Note  Patient ID: Felicia Jones, MRN: [id number],    Date: 11/13/2023  Time Spent: 40 total minutes. I spent 25 minutes on real-time audio with the patient on the date of service. I spent an additional 15  minutes on pre- and post-visit activities on the date of service including collateral, chart review, team discussion, and documentation.    Treatment Type: Individual Therapy  Reported Symptoms: Overall stable mood, mild anxiety, frustration   Mental Status Exam:  Appearance:   NA     Behavior:  Appropriate, Sharing, and Motivated  Motor:  Normal  Speech/Language:   Clear and Coherent and Normal Rate  Affect:  NA  Mood:  normal  Thought process:  normal  Thought content:    WNL  Sensory/Perceptual disturbances:    WNL  Orientation:  oriented to person, place, time/date, situation, and day of week  Attention:  Good  Concentration:  Good  Memory:  WNL  Fund of knowledge:   Good  Insight:    Fair  Judgment:   Good  Impulse Control:  Good   Risk Assessment: Danger to Self:  No Self-injurious Behavior: No Danger to Others: No Duty to Warn:no Physical Aggression / Violence:No  Access to Firearms a concern: No  Gang Involvement:No   Subjective: Patient shares overall stable mood, but continues to have some anxiousness and frustration in relationship with husband. She shares that she is working on changing her thoughts and communicating effectively but sometimes struggles with knowing when, what and how to communicate with her husband. Patient was receptive to feedback and intervention from LCSW and actively and effectively participated throughout the session. Patient is likely to benefit from future treatment because they remain motivated to decrease mood and anxiety symptoms and reports benefit of regular sessions in addressing these symptoms.    Interventions: Cognitive Behavioral Therapy and Client Centered  LCSW established psychological safety.  LCSW met with patient to identify needs related to stressors and functioning, and assess and monitor for signs and symptoms of depression and anxiety, and assess safety. Checked in with patient regarding how they have been doing since the last follow-up session. LCSW assisted patient in processing their thoughts and emotions regarding marital challenges.  LCSW reviewed thoughts, emotions, and behaviors to assist patient in differentiating, reframed unhelpful thoughts, and reviewed assertive communication. Provided support through active listening, validation of feelings, and highlighted patient's strengths.  Diagnosis:   ICD-10-CM   1. Generalized anxiety disorder  F41.1      Plan: Patient's goal of treatment is how to not get so aggravated so fast, not feel so anxious all the time and have so many worries, learn to not stress so much.   Treatment Target: Understand the relationship between thoughts, emotions, and behaviors  Goal: Patient will verbalize and demonstrate an understanding of the relationship between thoughts, emotions, and behaviors.  Psychoeducation on CBTs model   Oriented the client to the therapeutic approach Teach the connection between thoughts, emotions, and behaviors   Treatment Target: Increase realistic balanced thinking -to learn how to replace thinking with thoughts that are more accurate or helpful Goal: Patient will Identify and reframe automatic negative thoughts at least 3 times per week. Measure: Therapist observation and patient report.  Explore patient's thoughts, beliefs, automatic thoughts, assumptions  Identify and replace unhelpful thinking patterns (upsetting ideas, self-talk and mental images) Process distress and allow for emotional release  Questioning and challenging thoughts Cognitive reappraisal  Restructuring, Socratic questioning  Provide psychoeducation on core  beliefs, explore, and assist patient in identifying core beliefs   Treatment  Target: Increase psychological flexibility and create a more meaningful life Goal: Practice mindfulness techniques 3x per week. Measure: Self-monitoring log. Timeline: 4 weeks. Goal: Identify and clarify top 3 personal values. Measure: Completed values worksheet and therapist discussion. Timeline: 2-4 sessions. Goal: Practice mindfulness techniques 3x per week. Measure: Self-monitoring log/self-report.  Mindfulness Acceptance practices  Values clarification   Increase meaningful and pleasurable events/activities  Cognitive Diffusion exercises   Treatment Target: Increase emotional regulation  Goal: Patient will engage in 3 self-care practices each week.  Goal: Patient will verbalize use of assertive communication  Coping skills to manage anxiety   Teach distress tolerance techniques - "what helps me"  Opposite action PLEASE  skills or self-care skills  Self-soothing  Cope ahead skills - imagery, rehearsal, problem-solving, exposure   Assertiveness communication  Future Appointments  Date Time Provider Department Center  11/20/2023  9:00 AM Nyle Belling, LCSW [facility] None   Nyle Belling, [place]

## 2023-11-20 ENCOUNTER — Ambulatory Visit: Admitting: Licensed Clinical Social Worker

## 2023-11-20 DIAGNOSIS — F411 Generalized anxiety disorder: Secondary | ICD-10-CM

## 2023-11-20 NOTE — Progress Notes (Signed)
 Counselor/Therapist Progress Note  Patient ID: Felicia Jones, MRN: [id number],    Date: 11/20/2023  Time Spent: 45 total minutes. I spent 40 minutes on real-time audio with the patient on the date of service. I spent an additional 5  minutes on pre- and post-visit activities on the date of service including collateral, chart review, team discussion, and documentation.   Treatment Type: Individual Therapy  Reported Symptoms: Anxiety, worries, overwhelm   Mental Status Exam:  Appearance:   NA     Behavior:  Appropriate, Sharing, and Motivated  Motor:  Normal  Speech/Language:   Clear and Coherent and Normal Rate  Affect:  NA  Mood:  normal  Thought process:  normal  Thought content:    WNL  Sensory/Perceptual disturbances:    WNL  Orientation:  oriented to person, place, time/date, situation, and day of week  Attention:  Good  Concentration:  Good  Memory:  WNL  Fund of knowledge:   Good  Insight:    Good  Judgment:   Good  Impulse Control:  Good   Risk Assessment: Danger to Self:  No Self-injurious Behavior: No Danger to Others: No Duty to Warn:no Physical Aggression / Violence:No  Access to Firearms a concern: No  Gang Involvement:No   Subjective: Patient reports ongoing difficulties with anxiety and feelings of overwhelm 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 She expressed insight into her emotional experience and was receptive to feedback and interventions provided by the LCSW. The patient actively and effectively engaged in the session, demonstrating motivation to reduce anxiety and improve overall functioning. She appears likely to benefit from continued treatment due to her ongoing commitment to personal growth and symptom management.  Interventions: Cognitive Behavioral Therapy, Mindfulness Meditation, and Client Centered  LCSW established psychological safety. Met with patient to assess current needs related to stressors and  daily functioning. Monitored for symptoms of and assessed for safety. Conducted a follow-up check-in on the patient's well-being since the last session. Explored the patient's perceptions of her current challenges related to feeling overwhelmed by multiple responsibilities. Validated her emotional experience and provided support through active listening. Utilized cognitive reframing techniques to address unhelpful thought patterns contributing to anxiety. Reviewed the concept and practice of mindfulness-based single-tasking to support present-moment awareness. Collaboratively explored realistic self-care strategies the patient could incorporate into her daily routine to enhance coping and reduce stress.  Diagnosis:   ICD-10-CM   1. Generalized anxiety disorder  F41.1      Plan: Patient's goal of treatment is how to not get so aggravated so fast, not feel so anxious all the time and have so many worries, learn to not stress so much.    Treatment Target: Understand the relationship between thoughts, emotions, and behaviors  Goal: Patient will verbalize and demonstrate an understanding of the relationship between thoughts, emotions, and behaviors.   Psychoeducation on CBTs model   Oriented the client to the therapeutic approach Teach the connection between thoughts, emotions, and behaviors    Treatment Target: Increase realistic balanced thinking -to learn how to replace thinking with thoughts that are more accurate or helpful Goal: Patient will Identify and reframe automatic negative thoughts at least 3 times per week. Measure: Therapist observation and patient report.   Explore patient's thoughts, beliefs, automatic thoughts, assumptions  Identify and replace unhelpful thinking patterns (upsetting ideas, self-talk and mental images) Process distress and allow for emotional release  Questioning and challenging thoughts Cognitive reappraisal  Restructuring, Socratic questioning  Provide  psychoeducation on core beliefs, explore, and assist patient in identifying core beliefs    Treatment Target: Increase psychological flexibility and create a more meaningful life Goal: Practice mindfulness techniques 3x per week. Measure: Self-monitoring log. Timeline: 4 weeks. Goal: Identify and clarify top 3 personal values. Measure: Completed values worksheet and therapist discussion. Timeline: 2-4 sessions. Goal: Practice mindfulness techniques 3x per week. Measure: Self-monitoring log/self-report.   Mindfulness Acceptance practices  Values clarification   Increase meaningful and pleasurable events/activities  Cognitive Diffusion exercises    Treatment Target: Increase emotional regulation  Goal: Patient will engage in 3 self-care practices each week.  Goal: Patient will verbalize use of assertive communication  Coping skills to manage anxiety   Teach distress tolerance techniques - "what helps me"  Opposite action PLEASE  skills or self-care skills  Self-soothing  Cope ahead skills - imagery, rehearsal, problem-solving, exposure   Assertiveness communication  Future Appointments  Date Time Provider Department Center  12/04/2023 10:00 AM Nyle Belling, LCSW [facility] None    Nyle Belling, LCSW

## 2023-12-04 ENCOUNTER — Ambulatory Visit: Admitting: Licensed Clinical Social Worker

## 2023-12-04 DIAGNOSIS — F411 Generalized anxiety disorder: Secondary | ICD-10-CM

## 2023-12-04 NOTE — Progress Notes (Signed)
 Counselor/Therapist Progress Note  Patient ID: Felicia Jones, MRN: [id number],    Date: 12/04/2023  Time Spent: 44 total minutes. I spent 39 minutes on real-time audio with the patient on the date of service. I spent an additional 5  minutes on pre- and post-visit activities on the date of service including collateral, chart review, team discussion, and documentation.   Treatment Type: Individual Therapy  Reported Symptoms: Anxiety, anxious thoughts  Mental Status Exam:  Appearance:   NA     Behavior:  Appropriate, Sharing, and Motivated  Motor:  Normal  Speech/Language:   Clear and Coherent and Normal Rate  Affect:  Appropriate, Congruent, and Full Range  Mood:  normal  Thought process:  normal  Thought content:    WNL  Sensory/Perceptual disturbances:    WNL  Orientation:  oriented to person, place, time/date, situation, and day of week  Attention:  Good  Concentration:  Good  Memory:  WNL  Fund of knowledge:   Good  Insight:    Good  Judgment:   Good  Impulse Control:  Good   Risk Assessment: Danger to Self:  No Self-injurious Behavior: No Danger to Others: No Duty to Warn:no Physical Aggression / Violence:No  Access to Firearms a concern: No  Gang Involvement:No   Subjective: Patient reports she has not been getting adequate sleep due to her youngest child teething. She focused much of the session on stress related to parenting experiences. Patient was receptive to feedback and interventions provided by the LCSW and participated actively and effectively throughout the session.  Interventions: Cognitive Behavioral Therapy and Client Centered  LCSW established psychological safety and met with the patient to assess current needs related to stressors and daily functioning. Symptoms of anxiety and depression were monitored, and a safety assessment was conducted. A follow-up check-in was completed to assess the patient's well-being since the last session.  LCSW assisted the  patient in processing her thoughts and emotions regarding challenges related to her child's school situation and concerns about her other child's speech therapy. The patient's feelings of frustration were validated and LCSW praised patient for not acting out of emotion. LCSW helped reframe unhelpful thoughts and supported the patient in exploring appropriate ways to communicate her concerns to relevant individuals. LCSW praised patient for not acting out of emotion.   Diagnosis:   ICD-10-CM   1. Generalized anxiety disorder  F41.1      Plan: Patient's goal of treatment is how to not get so aggravated so fast, not feel so anxious all the time and have so many worries, learn to not stress so much.    Treatment Target: Understand the relationship between thoughts, emotions, and behaviors  Goal: Patient will verbalize and demonstrate an understanding of the relationship between thoughts, emotions, and behaviors.   Psychoeducation on CBTs model   Oriented the client to the therapeutic approach Teach the connection between thoughts, emotions, and behaviors    Treatment Target: Increase realistic balanced thinking -to learn how to replace thinking with thoughts that are more accurate or helpful Goal: Patient will Identify and reframe automatic negative thoughts at least 3 times per week. Measure: Therapist observation and patient report.   Explore patient's thoughts, beliefs, automatic thoughts, assumptions  Identify and replace unhelpful thinking patterns (upsetting ideas, self-talk and mental images) Process distress and allow for emotional release  Questioning and challenging thoughts Cognitive reappraisal  Restructuring, Socratic questioning  Provide psychoeducation on core beliefs, explore, and assist patient in identifying core beliefs  Treatment Target: Increase psychological flexibility and create a more meaningful life Goal: Practice mindfulness techniques 3x per week. Measure:  Self-monitoring log. Timeline: 4 weeks. Goal: Identify and clarify top 3 personal values. Measure: Completed values worksheet and therapist discussion. Timeline: 2-4 sessions. Goal: Practice mindfulness techniques 3x per week. Measure: Self-monitoring log/self-report.   Mindfulness Acceptance practices  Values clarification   Increase meaningful and pleasurable events/activities  Cognitive Diffusion exercises    Treatment Target: Increase emotional regulation  Goal: Patient will engage in 3 self-care practices each week.  Goal: Patient will verbalize use of assertive communication  Coping skills to manage anxiety   Teach distress tolerance techniques - "what helps me"  Opposite action PLEASE  skills or self-care skills  Self-soothing  Cope ahead skills - imagery, rehearsal, problem-solving, exposure   Assertiveness communication  Future Appointments  Date Time Provider Department Center  12/18/2023  1:00 PM Nyle Belling, LCSW [facility] None    Nyle Belling, LCSW

## 2023-12-18 ENCOUNTER — Ambulatory Visit: Admitting: Licensed Clinical Social Worker

## 2023-12-18 DIAGNOSIS — F411 Generalized anxiety disorder: Secondary | ICD-10-CM

## 2023-12-18 NOTE — Progress Notes (Signed)
 Counselor/Therapist Progress Note  Patient ID: Felicia Jones, MRN: [id number],    Date: 12/18/2023  Time Spent: 45 total minutes. I spent 40 minutes on real-time audio and video with the patient on the date of service. I spent an additional 5  minutes on pre- and post-visit activities on the date of service including collateral, chart review, team discussion, and documentation.   Arrived 15 minutes late.   Treatment Type: Psychotherapy  Reported Symptoms: Anxiety, anxious thoughts, overwhelm   Mental Status Exam:  Appearance:   Casual and Fairly Groomed     Behavior:  Appropriate, Sharing, and Motivated  Motor:  Normal  Speech/Language:   Clear and Coherent and Normal Rate  Affect:  Appropriate, Congruent, and Full Range  Mood:  normal  Thought process:  normal  Thought content:    WNL  Sensory/Perceptual disturbances:    WNL  Orientation:  oriented to person, place, time/date, situation, and day of week  Attention:  Good  Concentration:  Good  Memory:  WNL  Fund of knowledge:   Good  Insight:    Fair  Judgment:   Fair  Impulse Control:  Good   Risk Assessment: Danger to Self:  No Self-injurious Behavior: No Danger to Others: No Duty to Warn:no Physical Aggression / Violence:No  Access to Firearms a concern: No  Gang Involvement:No   Subjective: Patient reports feeling overwhelmed and like she is running all over the place. She is experiencing increased irritability, which she attributes to ongoing challenges in her relationships with her brother and mother. Patient was receptive to feedback and demonstrated active and effective participation throughout the session. She engaged well with the interventions provided by the LCSW. Administrative matters were briefly addressed, including a discussion about billing changes effective July 1. Patient expressed understanding that she will need to come to [facility] to complete the registration process.   Interventions: Cognitive  Behavioral Therapy and Client Centered  LCSW established psychological safety and met with the patient to assess current needs related to stressors and daily functioning. Monitored for symptoms of depressed mood and anxiety, and assessed for safety. Conducted a follow-up check-in on the patient's well-being since the last session. LCSW assisted the patient in processing thoughts and emotions related to challenges within her family of origin. Patient's feelings of frustration were validated, and the origins of these emotions were explored. LCSW provided cognitive reframing of unhelpful thoughts contributing to distress. Discussed the concept of circles of control and encouraged the patient to focus on aspects within her control, including thoughts, behaviors, boundaries, and effective communication.  Diagnosis:   ICD-10-CM   1. Generalized anxiety disorder  F41.1      Plan: Patient's goal of treatment is how to not get so aggravated so fast, not feel so anxious all the time and have so many worries, learn to not stress so much.    Treatment Target: Understand the relationship between thoughts, emotions, and behaviors  Goal: Patient will verbalize and demonstrate an understanding of the relationship between thoughts, emotions, and behaviors.   Psychoeducation on CBTs model   Oriented the client to the therapeutic approach Teach the connection between thoughts, emotions, and behaviors    Treatment Target: Increase realistic balanced thinking -to learn how to replace thinking with thoughts that are more accurate or helpful Goal: Patient will Identify and reframe automatic negative thoughts at least 3 times per week. Measure: Therapist observation and patient report.   Explore patient's thoughts, beliefs, automatic thoughts, assumptions  Identify and replace unhelpful  thinking patterns (upsetting ideas, self-talk and mental images) Process distress and allow for emotional release  Questioning and  challenging thoughts Cognitive reappraisal  Restructuring, Socratic questioning  Provide psychoeducation on core beliefs, explore, and assist patient in identifying core beliefs    Treatment Target: Increase psychological flexibility and create a more meaningful life Goal: Practice mindfulness techniques 3x per week. Measure: Self-monitoring log. Timeline: 4 weeks. Goal: Identify and clarify top 3 personal values. Measure: Completed values worksheet and therapist discussion. Timeline: 2-4 sessions. Goal: Practice mindfulness techniques 3x per week. Measure: Self-monitoring log/self-report.   Mindfulness Acceptance practices  Values clarification   Increase meaningful and pleasurable events/activities  Cognitive Diffusion exercises    Treatment Target: Increase emotional regulation  Goal: Patient will engage in 3 self-care practices each week.  Goal: Patient will verbalize use of assertive communication  Coping skills to manage anxiety   Teach distress tolerance techniques - "what helps me"  Opposite action PLEASE  skills or self-care skills  Self-soothing  Cope ahead skills - imagery, rehearsal, problem-solving, exposure   Assertiveness communication  Future Appointments  Date Time Provider Department Center  12/24/2023  1:00 PM Felicia Belling, LCSW [facility] None    Felicia Belling, LCSW

## 2023-12-24 ENCOUNTER — Ambulatory Visit: Admitting: Licensed Clinical Social Worker

## 2024-01-07 ENCOUNTER — Ambulatory Visit: Admitting: Licensed Clinical Social Worker

## 2024-01-07 DIAGNOSIS — F411 Generalized anxiety disorder: Secondary | ICD-10-CM | POA: Diagnosis not present

## 2024-01-07 NOTE — Addendum Note (Signed)
 Addended by: Ameliarose Shark on: 01/07/2024 12:10 PM   Modules accepted: Level of Service

## 2024-01-07 NOTE — Progress Notes (Signed)
 Counselor/Therapist Progress Note  Patient ID: Felicia Jones, MRN: [id number],    Date: 01/07/2024  Time Spent: 50 total minutes. I spent 45 minutes on real-time audio and video with the patient on the date of service. I spent an additional 5  minutes on pre- and post-visit activities on the date of service including collateral, chart review, team discussion, and documentation.    Treatment Type: Psychotherapy  Reported Symptoms: Overall mood good, Mild anxiety, anxious thoughts, irritability    Mental Status Exam:  Appearance:   Casual and Neat     Behavior:  Appropriate, Sharing, and Motivated  Motor:  Normal  Speech/Language:   Clear and Coherent and Normal Rate  Affect:  Appropriate, Congruent, and Full Range  Mood:  normal  Thought process:  normal  Thought content:    WNL  Sensory/Perceptual disturbances:    WNL  Orientation:  oriented to person, place, time/date, situation, and day of week  Attention:  Good  Concentration:  Good  Memory:  WNL  Fund of knowledge:   Good  Insight:    Good  Judgment:   Good  Impulse Control:  Good   Risk Assessment: Danger to Self:  No Self-injurious Behavior: No Danger to Others: No Duty to Warn:no Physical Aggression / Violence:No  Access to Firearms a concern: No  Gang Involvement:No   Subjective: Patient reports that overall her mood has been good but she also reports feeling aggravated. Patient was engaged and cooperative throughout the session using time effectively to discuss thoughts and feelings.    Interventions: Cognitive Behavioral Therapy and Client Centered  LCSW established psychological safety. Met with patient to assess current needs related to stressors and daily functioning. Monitored for symptoms of anxiety and depressed mood assessed for safety. Conducted a follow-up check-in on the patient's well-being since the last session. LCSW assisted the patient in processing thoughts and emotions related to ongoing  relationship challenges with her husband's extended family. Validated the patient's feelings of frustration and supported cognitive reframing of distressing and unhelpful thoughts. Guided the patient in identifying the somatic felt sense that arises during difficult interpersonal encounters and introduced the concept of gentle self-touch as a self-soothing technique. Encouraged the development and use of individualized, compassionate self-soothing statements. Explored the patient's reported feelings of "aggravation" and overwhelm, and discussed strategies to enhance self-care routines aimed at reducing emotional overload and improving overall well-being.  Diagnosis:   ICD-10-CM   1. Generalized anxiety disorder  F41.1      Plan: follow up on felt sensation from the encounters with extended family - in laws.   Patient's goal of treatment is how to not get so aggravated so fast, not feel so anxious all the time and have so many worries, learn to not stress so much.    Treatment Target: Understand the relationship between thoughts, emotions, and behaviors  Goal: Patient will verbalize and demonstrate an understanding of the relationship between thoughts, emotions, and behaviors.   Psychoeducation on CBTs model   Oriented the client to the therapeutic approach Teach the connection between thoughts, emotions, and behaviors    Treatment Target: Increase realistic balanced thinking -to learn how to replace thinking with thoughts that are more accurate or helpful Goal: Patient will Identify and reframe automatic negative thoughts at least 3 times per week. Measure: Therapist observation and patient report.   Explore patient's thoughts, beliefs, automatic thoughts, assumptions  Identify and replace unhelpful thinking patterns (upsetting ideas, self-talk and mental images) Process distress and allow for  emotional release  Questioning and challenging thoughts Cognitive reappraisal  Restructuring,  Socratic questioning  Provide psychoeducation on core beliefs, explore, and assist patient in identifying core beliefs    Treatment Target: Increase psychological flexibility and create a more meaningful life Goal: Practice mindfulness techniques 3x per week. Measure: Self-monitoring log. Timeline: 4 weeks. Goal: Identify and clarify top 3 personal values. Measure: Completed values worksheet and therapist discussion. Timeline: 2-4 sessions. Goal: Practice mindfulness techniques 3x per week. Measure: Self-monitoring log/self-report.   Mindfulness Acceptance practices  Values clarification   Increase meaningful and pleasurable events/activities  Cognitive Diffusion exercises    Treatment Target: Increase emotional regulation  Goal: Patient will engage in 3 self-care practices each week.  Goal: Patient will verbalize use of assertive communication  Coping skills to manage anxiety   Teach distress tolerance techniques - "what helps me"  Opposite action PLEASE  skills or self-care skills  Self-soothing  Cope ahead skills - imagery, rehearsal, problem-solving, exposure   Assertiveness communication  Future Appointments  Date Time Provider Department Center  01/21/2024 11:00 AM Ellender Palma, LCSW [facility] None    [place], [place]

## 2024-01-21 ENCOUNTER — Ambulatory Visit: Admitting: Licensed Clinical Social Worker

## 2024-01-21 DIAGNOSIS — F411 Generalized anxiety disorder: Secondary | ICD-10-CM | POA: Diagnosis not present

## 2024-01-21 NOTE — Progress Notes (Signed)
 Counselor/Therapist Progress Note  Patient ID: Felicia Jones, MRN: [id number],    Date: 01/21/2024  Time Spent: 50 total minutes. I spent 45 minutes on real-time audio and video with the patient on the date of service. I spent an additional 5 minutes on pre- and post-visit activities on the date of service including collateral, chart review, team discussion, and documentation.   Patient arrived 15 minutes late to session    Treatment Type: Psychotherapy  Reported Symptoms: Overall mood stability, mild anxiety, irritability   Mental Status Exam:  Appearance:   Casual     Behavior:  Appropriate, Sharing, and Motivated  Motor:  Normal  Speech/Language:   Clear and Coherent and Normal Rate  Affect:  Appropriate, Congruent, and Full Range  Mood:  normal  Thought process:  normal  Thought content:    WNL  Sensory/Perceptual disturbances:    WNL  Orientation:  oriented to person, place, time/date, situation, and day of week  Attention:  Good  Concentration:  Good  Memory:  WNL  Fund of knowledge:   Good  Insight:    Good  Judgment:   Good  Impulse Control:  Good   Risk Assessment: Danger to Self:  No Self-injurious Behavior: No Danger to Others: No Duty to Warn:no Physical Aggression / Violence:No  Access to Firearms a concern: No  Gang Involvement:No   Subjective: Patient shares concerns about feeling irritable. She reports recently returning from a vacation, which she describes as enjoyable overall, but notes experiencing frequent irritability--particularly toward her husband and her brother's girlfriend.  The patient was engaged and cooperative throughout the session, using the time effectively to explore her thoughts and feelings. She demonstrated insight and was receptive to both feedback and therapeutic interventions.  Interventions: Cognitive Behavioral Therapy and Client Centered  LCSW established psychological safety. LCSW met with patient to assess current needs  related to stressors and daily functioning. Monitored for symptoms of anxiety assessed for safety. Conducted a follow-up check-in on the patient's well-being since the last session. LCSW provided a supportive and structured environment, utilizing a blended approach incorporating CBT, ACT (with an emphasis on cognitive defusion), and somatic awareness. Assisted the patient in processing thoughts and emotions related to a recent vacation and ongoing interpersonal and marital challenges. Explored the patient's experience of irritability by identifying associated thoughts, emotions, and physical sensations. Supported the patient in increasing somatic awareness through mindfulness-based strategies to foster present-moment focus and emotional regulation. LCSW gently explored the patient's patterns of defensiveness and judgment toward herself and others, helping her recognize these responses as protective strategies shaped by past relational and emotional experiences. Facilitated insight into how these patterns may impact current relationships and emotional well-being. Reviewed cognitive defusion techniques to help the patient observe and separate from unhelpful thoughts, rather than becoming entangled in them. Reinforced the use of self-soothing strategies to support emotional regulation and reduce reactivity.  Diagnosis:   ICD-10-CM   1. Generalized anxiety disorder  F41.1      Plan: Patient's goal of treatment is how to not get so aggravated so fast, not feel so anxious all the time and have so many worries, learn to not stress so much.    Treatment Target: Understand the relationship between thoughts, emotions, and behaviors  Goal: Patient will verbalize and demonstrate an understanding of the relationship between thoughts, emotions, and behaviors.   Psychoeducation on CBTs model   Oriented the client to the therapeutic approach Teach the connection between thoughts, emotions, and behaviors  Treatment  Target: Increase realistic balanced thinking -to learn how to replace thinking with thoughts that are more accurate or helpful Goal: Patient will Identify and reframe automatic negative thoughts at least 3 times per week. Measure: Therapist observation and patient report.   Explore patient's thoughts, beliefs, automatic thoughts, assumptions  Identify and replace unhelpful thinking patterns (upsetting ideas, self-talk and mental images) Process distress and allow for emotional release  Questioning and challenging thoughts Cognitive reappraisal  Restructuring, Socratic questioning  Provide psychoeducation on core beliefs, explore, and assist patient in identifying core beliefs    Treatment Target: Increase psychological flexibility and create a more meaningful life Goal: Practice mindfulness techniques 3x per week. Measure: Self-monitoring log. Timeline: 4 weeks. Goal: Identify and clarify top 3 personal values. Measure: Completed values worksheet and therapist discussion. Timeline: 2-4 sessions. Goal: Practice mindfulness techniques 3x per week. Measure: Self-monitoring log/self-report.   Mindfulness Acceptance practices  Values clarification   Increase meaningful and pleasurable events/activities  Cognitive Diffusion exercises    Treatment Target: Increase emotional regulation  Goal: Patient will engage in 3 self-care practices each week.  Goal: Patient will verbalize use of assertive communication  Coping skills to manage anxiety   Teach distress tolerance techniques - "what helps me"  Opposite action PLEASE  skills or self-care skills  Self-soothing  Cope ahead skills - imagery, rehearsal, problem-solving, exposure   Assertiveness communication  Future Appointments  Date Time Provider Department Center  01/28/2024 11:00 AM Ellender Palma, LCSW [facility] None     [place], [place]

## 2024-01-28 ENCOUNTER — Ambulatory Visit: Admitting: Licensed Clinical Social Worker

## 2024-01-28 DIAGNOSIS — F411 Generalized anxiety disorder: Secondary | ICD-10-CM

## 2024-01-28 NOTE — Progress Notes (Signed)
 Counselor/Therapist Progress Note  Patient ID: Felicia Jones, MRN: [id number],    Date: 01/28/2024  Time Spent: 50 total minutes. I spent 45 minutes on real-time audio and video with the patient on the date of service. I spent an additional 5  minutes on pre- and post-visit activities on the date of service including collateral, chart review, team discussion, and documentation.   Treatment Type: Psychotherapy  Reported Symptoms: Anxiety, overwhelm, irritability   Mental Status Exam:  Appearance:   Casual     Behavior:  Appropriate, Sharing, and Motivated  Motor:  Normal  Speech/Language:   Clear and Coherent and Normal Rate  Affect:  Appropriate, Congruent, and Full Range  Mood:  normal  Thought process:  normal  Thought content:    WNL  Sensory/Perceptual disturbances:    WNL  Orientation:  oriented to person, place, time/date, situation, and day of week  Attention:  Good  Concentration:  Good  Memory:  WNL  Fund of knowledge:   Good  Insight:    Fair  Judgment:   Fair  Impulse Control:  Fair   Risk Assessment: Danger to Self:  No Self-injurious Behavior: No Danger to Others: No Duty to Warn:no Physical Aggression / Violence:No  Access to Firearms a concern: No  Gang Involvement:No   Subjective: Patient reports that it has been a difficult morning, describing herself as feeling aggravated due to interactions with her family of origin. She was engaged and cooperative throughout the session, utilizing the time to process multiple stressors, primarily related to interpersonal relationships.   Interventions: Cognitive Behavioral Therapy and Client Centered  LCSW established psychological safety and engaged the patient in a supportive therapeutic environment. The session focused on identifying current needs related to stressors and overall functioning. LCSW assessed and monitored for signs and symptoms of anxiety, as well as safety concerns. A check-in was completed regarding  the patient's well-being since the last session. LCSW used a blended CBTs approach, assisting her in processing thoughts and emotions related to challenges with family relationships. The patient's feelings of aggravation were validated, and unhelpful thoughts were gently reframed. The session included a review of the patient's values, boundaries, and communication strategies. Guided meditations and the importance of self-care were also discussed.  Diagnosis:   ICD-10-CM   1. Generalized anxiety disorder  F41.1      Plan: Patient's goal of treatment is how to not get so aggravated so fast, not feel so anxious all the time and have so many worries, learn to not stress so much.    Treatment Target: Understand the relationship between thoughts, emotions, and behaviors  Goal: Patient will verbalize and demonstrate an understanding of the relationship between thoughts, emotions, and behaviors.   Psychoeducation on CBTs model   Oriented the client to the therapeutic approach Teach the connection between thoughts, emotions, and behaviors    Treatment Target: Increase realistic balanced thinking -to learn how to replace thinking with thoughts that are more accurate or helpful Goal: Patient will Identify and reframe automatic negative thoughts at least 3 times per week. Measure: Therapist observation and patient report.   Explore patient's thoughts, beliefs, automatic thoughts, assumptions  Identify and replace unhelpful thinking patterns (upsetting ideas, self-talk and mental images) Process distress and allow for emotional release  Questioning and challenging thoughts Cognitive reappraisal  Restructuring, Socratic questioning  Provide psychoeducation on core beliefs, explore, and assist patient in identifying core beliefs    Treatment Target: Increase psychological flexibility and create a more meaningful life Goal:  Practice mindfulness techniques 3x per week. Measure: Self-monitoring  log. Timeline: 4 weeks. Goal: Identify and clarify top 3 personal values. Measure: Completed values worksheet and therapist discussion. Timeline: 2-4 sessions. Goal: Practice mindfulness techniques 3x per week. Measure: Self-monitoring log/self-report.   Mindfulness Acceptance practices  Values clarification   Increase meaningful and pleasurable events/activities  Cognitive Diffusion exercises    Treatment Target: Increase emotional regulation  Goal: Patient will engage in 3 self-care practices each week.  Goal: Patient will verbalize use of assertive communication  Coping skills to manage anxiety   Teach distress tolerance techniques - "what helps me"  Opposite action PLEASE  skills or self-care skills  Self-soothing  Cope ahead skills - imagery, rehearsal, problem-solving, exposure   Assertiveness communication  Future Appointments  Date Time Provider Department Center  02/04/2024 11:00 AM Ell[place], LCSW [facility] None    [place], [place]

## 2024-02-04 ENCOUNTER — Ambulatory Visit: Admitting: Licensed Clinical Social Worker

## 2024-02-04 DIAGNOSIS — F411 Generalized anxiety disorder: Secondary | ICD-10-CM | POA: Diagnosis not present
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
## 2024-02-07 ENCOUNTER — Encounter: Payer: Self-pay | Admitting: Emergency Medicine

## 2024-02-07 ENCOUNTER — Other Ambulatory Visit: Payer: Self-pay

## 2024-02-07 ENCOUNTER — Emergency Department
Admission: EM | Admit: 2024-02-07 | Discharge: 2024-02-07 | Disposition: A | Discharge status: Home / Self Care | Attending: Emergency Medicine | Admitting: Emergency Medicine

## 2024-02-07 DIAGNOSIS — S39012A Strain of muscle, fascia and tendon of lower back, initial encounter: Secondary | ICD-10-CM | POA: Insufficient documentation

## 2024-02-07 DIAGNOSIS — Y92410 Unspecified street and highway as the place of occurrence of the external cause: Secondary | ICD-10-CM | POA: Insufficient documentation

## 2024-02-07 DIAGNOSIS — S161XXA Strain of muscle, fascia and tendon at neck level, initial encounter: Secondary | ICD-10-CM | POA: Insufficient documentation

## 2024-02-07 DIAGNOSIS — O9A211 Injury, poisoning and certain other consequences of external causes complicating pregnancy, first trimester: Secondary | ICD-10-CM | POA: Insufficient documentation

## 2024-02-07 DIAGNOSIS — Z3A01 Less than 8 weeks gestation of pregnancy: Secondary | ICD-10-CM | POA: Diagnosis not present

## 2024-02-07 DIAGNOSIS — O0991 Supervision of high risk pregnancy, unspecified, first trimester: Secondary | ICD-10-CM | POA: Diagnosis not present

## 2024-02-07 LAB — POC URINE PREG, ED: Preg Test, Ur: POSITIVE — AB

## 2024-02-07 MED ORDER — ACETAMINOPHEN 325 MG PO TABS
650.0000 mg | ORAL_TABLET | Freq: Once | ORAL | Status: AC
  Administered 2024-02-07: 650 mg via ORAL
  Filled 2024-02-07: qty 2

## 2024-02-07 MED ORDER — IBUPROFEN 800 MG PO TABS
800.0000 mg | ORAL_TABLET | Freq: Once | ORAL | Status: DC
  Filled 2024-02-07: qty 1

## 2024-02-07 MED ORDER — ACETAMINOPHEN ER 650 MG PO TBCR: 650.0000 mg | EXTENDED_RELEASE_TABLET | Freq: Three times a day (TID) | ORAL | 0 refills | Status: AC | PRN

## 2024-02-07 MED ORDER — CYCLOBENZAPRINE HCL 10 MG PO TABS
5.0000 mg | ORAL_TABLET | Freq: Once | ORAL | Status: DC
  Filled 2024-02-07: qty 1
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
## 2024-02-13 ENCOUNTER — Ambulatory Visit: Admitting: Licensed Clinical Social Worker

## 2024-02-13 DIAGNOSIS — F411 Generalized anxiety disorder: Secondary | ICD-10-CM
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
## 2024-02-18 ENCOUNTER — Telehealth (INDEPENDENT_AMBULATORY_CARE_PROVIDER_SITE_OTHER)

## 2024-02-18 ENCOUNTER — Ambulatory Visit: Admitting: Licensed Clinical Social Worker

## 2024-02-18 DIAGNOSIS — Z3687 Encounter for antenatal screening for uncertain dates: Secondary | ICD-10-CM

## 2024-02-18 DIAGNOSIS — Z3480 Encounter for supervision of other normal pregnancy, unspecified trimester: Secondary | ICD-10-CM | POA: Insufficient documentation

## 2024-02-18 DIAGNOSIS — O0990 Supervision of high risk pregnancy, unspecified, unspecified trimester: Secondary | ICD-10-CM | POA: Insufficient documentation

## 2024-02-18 NOTE — Progress Notes (Signed)
 New OB Intake  I connected with  Felicia Jones on 02/18/24 at 11:15 AM EDT by telephone Video Visit and verified that I am speaking with the correct person using two identifiers. Nurse is located at [facility] and pt is located at home.  I discussed the limitations, risks, security and privacy concerns of performing an evaluation and management service by telephone and the availability of in person appointments. I also discussed with the patient that there may be a patient responsible charge related to this service. The patient expressed understanding and agreed to proceed.  I explained I am completing New OB Intake today. We discussed her EDD of unknown she is not sure at all when her lmp is  that is based on LMP of unsure . Pt is G5/P3. I reviewed her allergies, medications, Medical/Surgical/OB history, and appropriate screenings. There are cats in the home: no. Based on history, this is a/an pregnancy uncomplicated . Her obstetrical history is significant for NONE.  Patient Active Problem List   Diagnosis Date Noted   Supervision of other normal pregnancy, antepartum 02/18/2024   Obesity complicating pregnancy in third trimester 02/10/2023   Indication for care in labor or delivery 02/10/2023   Labor and delivery indication for care or intervention 02/10/2023   SVD (spontaneous vaginal delivery) 02/10/2023   Herpes simplex of female genitalia 05/30/2022   Generalized anxiety disorder 07/27/2021   Obesity affecting pregnancy in third trimester 06/16/2020   Bilateral low back pain without sciatica 01/28/2016    Concerns addressed today:   Delivery Plans:  Plans to deliver at [facility].  Anatomy US  Explained first scheduled US  will be 02/29/24. Anatomy US  will be scheduled around [redacted] weeks gestational age.  Labs Discussed genetic screening with patient. Patient would like genetic testing to be drawn at new OB visit. Discussed possible labs to be drawn at new OB  appointment.  COVID Vaccine Patient has not had COVID vaccine.   Social Determinants of Health Food Insecurity: denies food insecurity Transportation: Patient denies transportation needs. Childcare: Discussed no children allowed at ultrasound appointments.   First visit review I reviewed new OB appt with pt. I explained she will have blood work and pap smear/pelvic exam if indicated. Explained pt will be seen by she will schdule apt after US  as she is unsure of LMP .Encounter routed to provider in office as MD and midwife on call are not providers in office.  Felicia Jones, CMA 02/18/2024  11:42 AM

## 2024-02-18 NOTE — Addendum Note (Signed)
 Addended by: TAMEA ANNALEE DEL on: 02/18/2024 04:31 PM   Modules accepted: Orders

## 2024-02-25 ENCOUNTER — Ambulatory Visit: Admitting: Licensed Clinical Social Worker

## 2024-02-25 DIAGNOSIS — F411 Generalized anxiety disorder: Secondary | ICD-10-CM
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
## 2024-02-26 ENCOUNTER — Other Ambulatory Visit

## 2024-02-29 ENCOUNTER — Ambulatory Visit

## 2024-02-29 DIAGNOSIS — Z3A01 Less than 8 weeks gestation of pregnancy: Secondary | ICD-10-CM

## 2024-02-29 DIAGNOSIS — Z3480 Encounter for supervision of other normal pregnancy, unspecified trimester: Secondary | ICD-10-CM

## 2024-02-29 DIAGNOSIS — Z3687 Encounter for antenatal screening for uncertain dates: Secondary | ICD-10-CM

## 2024-03-10 ENCOUNTER — Ambulatory Visit: Admitting: Licensed Clinical Social Worker

## 2024-03-10 DIAGNOSIS — F411 Generalized anxiety disorder: Secondary | ICD-10-CM | POA: Diagnosis not present

## 2024-03-10 NOTE — Progress Notes (Signed)
 Counselor/Therapist Progress Note  Patient ID: Felicia Jones, MRN: [id number],    Date: 03/10/2024  Time Spent: 45 total minutes. I spent 35 minutes on real-time audio and video with the patient on the date of service. I spent an additional 10 minutes on pre- and post-visit activities on the date of service including collateral, chart review, team discussion, and documentation.   Treatment Type: Individual Therapy  Reported Symptoms: Intermittent low mood; anxiety, anxious thoughts, worries   Mental Status Exam:  Appearance:   Casual and Well Groomed     Behavior:  Appropriate, Sharing, and Motivated  Motor:  Normal  Speech/Language:   Clear and Coherent and Normal Rate  Affect:  Appropriate, Congruent, and Full Range  Mood:  normal  Thought process:  normal  Thought content:    WNL  Sensory/Perceptual disturbances:    WNL  Orientation:  oriented to person, place, time/date, situation, and day of week  Attention:  Good  Concentration:  Good  Memory:  WNL  Fund of knowledge:   Good  Insight:    Fair  Judgment:   Fair  Impulse Control:  Fair   Risk Assessment: Danger to Self:  No Self-injurious Behavior: No Danger to Others: No Duty to Warn:no Physical Aggression / Violence:No  Access to Firearms a concern: No  Gang Involvement:No   Subjective: Patient reports feeling stressed due to the ongoing challenge of sharing a car with her husband and feeling overwhelmed by household and parenting responsibilities. She describes occasional emotional exhaustion and irritability as a result. The patient shares that she had a conversation with her husband and is hopeful it will lead to more shared responsibilities and support. She reports feeling emotionally supported by her cousin and expresses continued motivation for therapy. Patient demonstrates insight into her current stressors and agrees with the current treatment plan.  Interventions: Cognitive Behavioral Therapy and Client Centered   LCSW established psychological safety to support open and honest communication. LCSW met with the patient to identify current needs related to psychosocial stressors and daily functioning, and to assess and monitor for signs and symptoms of anxiety and depressed mood. Safety was assessed and confirmed. LCSW conducted a check-in regarding how the patient has been doing since the last follow-up session. LCSW assisted the patient in processing thoughts and emotions related to current psychosocial stressors, including the challenges of sharing one vehicle and feelings of limited support from her husband. The patient's emotional experiences were validated, and she was praised for her willingness to communicate openly with her husband and seek support from her cousin and unhelpful thoughts were reframed. Sleep disturbance was discussed, and the importance of improving sleep hygiene was briefly introduced as a potential area of focus. LCSW collaborated with the patient in developing a treatment plan, and the patient voiced agreement with the identified goals and direction of care.   Diagnosis:   ICD-10-CM   1. Generalized anxiety disorder  F41.1      Plan: Patient shares that she wants to continue to work on her previous goal of treatment.   Patient's goal of treatment is how to not get so aggravated so fast, not feel so anxious all the time and have so many worries, learn to not stress so much.   Goal 1: Improve emotional regulation and reduce frequency and intensity of emotional reactivity (e.g., aggravation). Measurable Outcome:  Patient will report a 50% reduction in the frequency and intensity of feeling aggravated within 12 months, as measured by self-report and clinical observation.  Interventions:  Use CBT techniques to identify and reframe automatic thoughts contributing to emotional escalation.  Apply ACT strategies to increase psychological flexibility and decrease reactivity to  difficult emotions.  Mindfulness practices (e.g., mindful pauses, breath awareness) to build tolerance for discomfort and reduce impulsive responses.  Implement somatic resourcing techniques (e.g., grounding, body scans) to support nervous system regulation during moments of distress.  Goal 2: Reduce overall anxiety and excessive worry. Measurable Outcome:  Patient will report a decrease in anxiety symptoms from "daily and overwhelming" to "occasional and manageable" within 12 months, as measured by clinical assessments and self-report.  Interventions:  Use CBT interventions to challenge and restructure maladaptive worry patterns and catastrophic thinking.  Practice defusion techniques from ACT (e.g., labeling thoughts, "I'm having the thought that.") to reduce attachment to anxious thoughts.  Guide patient in mindfulness-based stress reduction (MBSR) to observe anxious thoughts without judgment.  Use somatic tracking to increase awareness of bodily sensations related to anxiety and introduce techniques to shift out of hyperarousal (e.g., orienting, pendulation).  Goal 3: Increase ability to manage stress effectively and enhance overall coping strategies. Measurable Outcome:  Patient will identify and consistently use at least 3 effective coping strategies for managing stress, demonstrating use in 80% of reported high-stress situations over the next 12 months.  Interventions:  Facilitate identification of personal stress triggers and co-develop a CBT-based coping plan.  Integrate ACT values clarification to help patient act in alignment with what matters most, even under stress.  Teach and reinforce daily mindfulness routines to promote present-moment awareness and reduce rumination.  Use somatic resourcing and regulation exercises (e.g., resourcing, tapping, movement) to support embodied stress relief and resilience.  Future Appointments  Date Time Provider Department Center   03/17/2024  4:00 PM Ellender Palma, LCSW [facility] None  04/02/2024  9:55 AM Dominic, Jinnie Jansky, CNM [facility] None   Palma Ellender, LCSW

## 2024-03-17 ENCOUNTER — Ambulatory Visit: Admitting: Licensed Clinical Social Worker

## 2024-03-17 DIAGNOSIS — F411 Generalized anxiety disorder: Secondary | ICD-10-CM

## 2024-03-17 NOTE — Progress Notes (Signed)
 Counselor/Therapist Progress Note  Patient ID: Felicia Jones, MRN: [id number],    Date: 03/17/2024  Time Spent: 45 total minutes. I spent 40 minutes on real-time audio and video with the patient on the date of service. I spent an additional 5 minutes on pre- and post-visit activities on the date of service including collateral, chart review, team discussion, and documentation.    Treatment Type: Individual Therapy  Reported Symptoms: Anxiety, anxiousness, anxious thoughts, irritability, low energy   Mental Status Exam:  Appearance:   Casual     Behavior:  Appropriate, Sharing, and Motivated  Motor:  Normal  Speech/Language:   Clear and Coherent and Normal Rate  Affect:  Appropriate, Congruent, and Full Range  Mood:  normal  Thought process:  normal  Thought content:    WNL  Sensory/Perceptual disturbances:    WNL  Orientation:  oriented to person, place, time/date, situation, and day of week  Attention:  Good  Concentration:  Good  Memory:  WNL  Fund of knowledge:   Good  Insight:    Fair  Judgment:   Fair  Impulse Control:  Fair   Risk Assessment: Danger to Self:  No Self-injurious Behavior: No Danger to Others: No Duty to Warn:no Physical Aggression / Violence:No  Access to Firearms a concern: No  Gang Involvement:No   Subjective: Patient reports feeling okay right now, but describes experiencing persistent tiredness and sleepiness. She states that the fatigue has been difficult to manage, but notes that recent conversations with her husband have been supportive and he has been helping more. Patient expressed feeling like she just needs a break and is currently exploring a possible opportunity to rest or recharge. She shared efforts to care for herself, including resting or napping when possible, engaging in enjoyable activities, and connecting with her cousin for social support.  Patient was engaged and actively participated in discussing her thoughts and emotions  throughout the session. She also participated in a mindfulness practice led by LCSW. Patient voiced continued motivation for treatment and affirmed alignment with the current treatment plan.  Interventions: Cognitive Behavioral Therapy and Client Centered  LCSW established psychological safety and rapport with the patient. Met with the patient to identify current needs related to stressors and overall functioning, and to assess and monitor for symptoms of anxiety, depressed mood, and any safety concerns. Conducted a follow-up check-in to explore how the patient has been doing since the previous session. LCSW provided emotional support using positive regard and an affirming, strengths-based approach to validate the patient's emotions. Supported the patient in exploring ways to increase her intentional use of self in daily routines and interactions. Facilitated a guided mindfulness meditation during the session, and collaborated with the patient to identify options for incorporating mindfulness into her nightly routine as a self-care strategy.  Diagnosis:   ICD-10-CM   1. Generalized anxiety disorder  F41.1      Plan:  Patient shares that she wants to continue to work on her previous goal of treatment.    Patient's goal of treatment is how to not get so aggravated so fast, not feel so anxious all the time and have so many worries, learn to not stress so much.    Goal 1: Improve emotional regulation and reduce frequency and intensity of emotional reactivity (e.g., aggravation). Measurable Outcome:  Patient will report a 50% reduction in the frequency and intensity of feeling aggravated within 12 months, as measured by self-report and clinical observation.  Interventions:  Use CBT techniques to  identify and reframe automatic thoughts contributing to emotional escalation.  Apply ACT strategies to increase psychological flexibility and decrease reactivity to difficult emotions.  Mindfulness  practices (e.g., mindful pauses, breath awareness) to build tolerance for discomfort and reduce impulsive responses.  Implement somatic resourcing techniques (e.g., grounding, body scans) to support nervous system regulation during moments of distress.  Goal 2: Reduce overall anxiety and excessive worry. Measurable Outcome:  Patient will report a decrease in anxiety symptoms from "daily and overwhelming" to "occasional and manageable" within 12 months, as measured by clinical assessments and self-report.  Interventions:  Use CBT interventions to challenge and restructure maladaptive worry patterns and catastrophic thinking.  Practice defusion techniques from ACT (e.g., labeling thoughts, "I'm having the thought that.") to reduce attachment to anxious thoughts.  Guide patient in mindfulness-based stress reduction (MBSR) to observe anxious thoughts without judgment.  Use somatic tracking to increase awareness of bodily sensations related to anxiety and introduce techniques to shift out of hyperarousal (e.g., orienting, pendulation).  Goal 3: Increase ability to manage stress effectively and enhance overall coping strategies. Measurable Outcome:  Patient will identify and consistently use at least 3 effective coping strategies for managing stress, demonstrating use in 80% of reported high-stress situations over the next 12 months.  Interventions:  Facilitate identification of personal stress triggers and co-develop a CBT-based coping plan.  Integrate ACT values clarification to help patient act in alignment with what matters most, even under stress.  Teach and reinforce daily mindfulness routines to promote present-moment awareness and reduce rumination.  Use somatic resourcing and regulation exercises (e.g., resourcing, tapping, movement) to support embodied stress relief and resilience.  Future Appointments  Date Time Provider Department Center  03/24/2024 12:00 PM Ellender Palma,  [place] [facility] None  04/02/2024  9:55 AM Dominic, Jinnie Jansky, CNM [facility] None   Palma Ellender, LCSW

## 2024-03-24 ENCOUNTER — Ambulatory Visit: Admitting: Licensed Clinical Social Worker

## 2024-03-24 ENCOUNTER — Telehealth: Payer: Self-pay | Admitting: Licensed Clinical Social Worker

## 2024-03-24 NOTE — Telephone Encounter (Signed)
 Patient did not show to Zoom appointment. LCSW called patient two times and lvm regarding appointment. LCSW waited 12 minutes for patient to arrive.

## 2024-03-27 ENCOUNTER — Ambulatory Visit: Admitting: Licensed Clinical Social Worker

## 2024-03-27 DIAGNOSIS — F411 Generalized anxiety disorder: Secondary | ICD-10-CM

## 2024-03-27 NOTE — Progress Notes (Signed)
 Counselor/Therapist Progress Note  Patient ID: Felicia Jones, MRN: [id number],    Date: 03/27/2024  Time Spent: 52 total minutes. I spent 47 face to face with the patient on the date of service. I spent an additional 5 minutes on pre- and post-visit activities on the date of service including collateral, chart review, team discussion, and documentation.   Treatment Type: Individual Therapy  Reported Symptoms: Overall mood is improving, increase in energy   Mental Status Exam:  Appearance:   Casual     Behavior:  Appropriate, Sharing, and Motivated  Motor:  Normal  Speech/Language:   Clear and Coherent and Normal Rate  Affect:  Appropriate, Congruent, and Full Range  Mood:  normal  Thought process:  normal  Thought content:    WNL  Sensory/Perceptual disturbances:    WNL  Orientation:  oriented to person, place, time/date, situation, and day of week  Attention:  Good  Concentration:  Good  Memory:  WNL  Fund of knowledge:   Good  Insight:    Fair  Judgment:   Fair  Impulse Control:  Fair   Risk Assessment: Danger to Self:  No Self-injurious Behavior: No Danger to Others: No Duty to Warn:no Physical Aggression / Violence:No  Access to Firearms a concern: No  Gang Involvement:No   Subjective: Patient reports improvement in mood and a slight increase in energy. She states that overall, she feels things are progressing in a positive direction. Patient was engaged and cooperative throughout the session, openly sharing her thoughts and feelings.  Interventions: Cognitive Behavioral Therapy and Client Centered  LCSW met with patient to identify needs related to stressors and functioning, and assess and monitor anxiety symptoms and mood, and assess safety. Checked in with patient regarding how they have been doing since the last follow-up session.  The session focused on identifying and exploring helpful thoughts, cognitive distortions and automatic thoughts related to recent  stressors, particularly interpersonal challenges with family members and decision-making stress related to purchasing a new car. Using Socratic questioning, the LCSW guided the patient in examining the evidence for and against these thoughts, reframed negative or rigid thoughts, increasing cognitive flexibility and promoting more balanced and realistic perspectives. Briefly reviewed increased engagement in meaningful activities, particularly those contributing to the patient's recent improvements in mood and energy. LCSW also emphasized emotional awareness and validation, supporting the patient in identifying and labeling emotions associated with recent experiences. The patient demonstrated increased insight into the connection between her thoughts, feelings, and behaviors. By the end of the session, the patient reported feeling a sense of relief and noted continued improvement in mood.  Diagnosis:   ICD-10-CM   1. Generalized anxiety disorder  F41.1      Plan: Patient shares that she wants to continue to work on her previous goal of treatment.    Patient's goal of treatment is how to not get so aggravated so fast, not feel so anxious all the time and have so many worries, learn to not stress so much.    Goal 1: Improve emotional regulation and reduce frequency and intensity of emotional reactivity (e.g., aggravation). Measurable Outcome:  Patient will report a 50% reduction in the frequency and intensity of feeling aggravated within 12 months, as measured by self-report and clinical observation.  Interventions:  Use CBT techniques to identify and reframe automatic thoughts contributing to emotional escalation.  Apply ACT strategies to increase psychological flexibility and decrease reactivity to difficult emotions.  Mindfulness practices (e.g., mindful pauses, breath awareness) to  build tolerance for discomfort and reduce impulsive responses.  Implement somatic resourcing techniques  (e.g., grounding, body scans) to support nervous system regulation during moments of distress.  Goal 2: Reduce overall anxiety and excessive worry. Measurable Outcome:  Patient will report a decrease in anxiety symptoms from "daily and overwhelming" to "occasional and manageable" within 12 months, as measured by clinical assessments and self-report.  Interventions:  Use CBT interventions to challenge and restructure maladaptive worry patterns and catastrophic thinking.  Practice defusion techniques from ACT (e.g., labeling thoughts, "I'm having the thought that.") to reduce attachment to anxious thoughts.  Guide patient in mindfulness-based stress reduction (MBSR) to observe anxious thoughts without judgment.  Use somatic tracking to increase awareness of bodily sensations related to anxiety and introduce techniques to shift out of hyperarousal (e.g., orienting, pendulation).  Goal 3: Increase ability to manage stress effectively and enhance overall coping strategies. Measurable Outcome:  Patient will identify and consistently use at least 3 effective coping strategies for managing stress, demonstrating use in 80% of reported high-stress situations over the next 12 months.  Interventions:  Facilitate identification of personal stress triggers and co-develop a CBT-based coping plan.  Integrate ACT values clarification to help patient act in alignment with what matters most, even under stress.  Teach and reinforce daily mindfulness routines to promote present-moment awareness and reduce rumination.  Use somatic resourcing and regulation exercises (e.g., resourcing, tapping, movement) to support embodied stress relief and resilience.  Future Appointments  Date Time Provider Department Center  04/01/2024  9:00 AM Ellender Palma, LCSW [facility] None  04/02/2024  9:55 AM Dominic, Jinnie Jansky, CNM [facility] None   Palma Ellender, LCSW

## 2024-04-01 ENCOUNTER — Ambulatory Visit: Admitting: Licensed Clinical Social Worker

## 2024-04-01 DIAGNOSIS — F411 Generalized anxiety disorder: Secondary | ICD-10-CM

## 2024-04-01 NOTE — Progress Notes (Signed)
 Counselor/Therapist Progress Note  Patient ID: Felicia Jones, MRN: [id number],    Date: 04/01/2024  Time Spent: 54 total minutes. I spent 47 minutes on real-time audio and video with the patient on the date of service. I spent an additional 7 minutes on pre- and post-visit activities on the date of service including collateral, chart review, team discussion, and documentation.   Treatment Type: Individual Therapy  Reported Symptoms: Anxiety, anxious thoughts  Mental Status Exam:  Appearance:   Casual     Behavior:  Appropriate, Sharing, and Motivated  Motor:  Normal  Speech/Language:   Clear and Coherent and Normal Rate  Affect:  Appropriate, Congruent, and Full Range  Mood:  normal  Thought process:  normal  Thought content:    WNL  Sensory/Perceptual disturbances:    WNL  Orientation:  oriented to person, place, time/date, situation, and day of week  Attention:  Good  Concentration:  Good  Memory:  WNL  Fund of knowledge:   Good  Insight:    Fair  Judgment:   Fair  Impulse Control:  Fair   Risk Assessment: Danger to Self:  No Self-injurious Behavior: No Danger to Others: No Duty to Warn:no Physical Aggression / Violence:No  Access to Firearms a concern: No  Gang Involvement:No   Subjective: Patient reports that she has been feeling "good" overall. However, she notes experiencing headaches, which she finds somewhat stressful; she plans to discuss this with her OB at her upcoming appointment. During the session, the patient focused on stressors related to feeling solely responsible for managing household responsibilities and a perceived lack of support from her husband. She was engaged and cooperative throughout the session. Patient reported having tried mindfulness meditation a few times and shared that it helped her feel more relaxed.  Interventions: Cognitive Behavioral Therapy, Mindfulness Meditation, and Client Centered  Using a CBT-informed approach, LCSW established  psychological safety and met with the patient to assess current needs related to stressors, daily functioning, and to monitor for signs and symptoms of anxiety, depressed mood, and safety concerns. LCSW began the session by checking in on how the patient has been doing since the last follow-up. LCSW supported patient in processing her thoughts and emotions related to the psychological burden of managing household responsibilities, lack of personal time, and perceived lack of support from her husband. Through guided discovery, LCSW helped the patient examine her perceptions of these challenges, promoting cognitive restructuring and increased insight. Interventions included the use of positive regard and optimism to validate the patient's emotional experience, while collaboratively exploring strategies for open communication with her husband and addressing perceived barriers. LCSW also encouraged the patient to broaden her perspective and consider ways to increase intentional self-care practices, including continued use of mindfulness meditation.  Diagnosis:   ICD-10-CM   1. Generalized anxiety disorder  F41.1      Plan: Patient shares that she wants to continue to work on her previous goal of treatment.    Patient's goal of treatment is how to not get so aggravated so fast, not feel so anxious all the time and have so many worries, learn to not stress so much.    Goal 1: Improve emotional regulation and reduce frequency and intensity of emotional reactivity (e.g., aggravation). Measurable Outcome:  Patient will report a 50% reduction in the frequency and intensity of feeling aggravated within 12 months, as measured by self-report and clinical observation.  Interventions:  Use CBT techniques to identify and reframe automatic thoughts contributing to emotional  escalation.  Apply ACT strategies to increase psychological flexibility and decrease reactivity to difficult emotions.  Mindfulness  practices (e.g., mindful pauses, breath awareness) to build tolerance for discomfort and reduce impulsive responses.  Implement somatic resourcing techniques (e.g., grounding, body scans) to support nervous system regulation during moments of distress.  Goal 2: Reduce overall anxiety and excessive worry. Measurable Outcome:  Patient will report a decrease in anxiety symptoms from "daily and overwhelming" to "occasional and manageable" within 12 months, as measured by clinical assessments and self-report.  Interventions:  Use CBT interventions to challenge and restructure maladaptive worry patterns and catastrophic thinking.  Practice defusion techniques from ACT (e.g., labeling thoughts, "I'm having the thought that.") to reduce attachment to anxious thoughts.  Guide patient in mindfulness-based stress reduction (MBSR) to observe anxious thoughts without judgment.  Use somatic tracking to increase awareness of bodily sensations related to anxiety and introduce techniques to shift out of hyperarousal (e.g., orienting, pendulation).  Goal 3: Increase ability to manage stress effectively and enhance overall coping strategies. Measurable Outcome:  Patient will identify and consistently use at least 3 effective coping strategies for managing stress, demonstrating use in 80% of reported high-stress situations over the next 12 months.  Interventions:  Facilitate identification of personal stress triggers and co-develop a CBT-based coping plan.  Integrate ACT values clarification to help patient act in alignment with what matters most, even under stress.  Teach and reinforce daily mindfulness routines to promote present-moment awareness and reduce rumination.  Use somatic resourcing and regulation exercises (e.g., resourcing, tapping, movement) to support embodied stress relief and resilience.  Future Appointments  Date Time Provider Department Center  04/02/2024  9:55 AM Dominic, Jinnie Jansky, CNM [facility] None  04/08/2024  9:00 AM Ellender Palma, LCSW [facility] None    Palma Ellender, [place]

## 2024-04-02 ENCOUNTER — Ambulatory Visit (INDEPENDENT_AMBULATORY_CARE_PROVIDER_SITE_OTHER): Admitting: Licensed Practical Nurse

## 2024-04-02 ENCOUNTER — Encounter: Payer: Self-pay | Admitting: Licensed Practical Nurse

## 2024-04-02 ENCOUNTER — Other Ambulatory Visit (HOSPITAL_COMMUNITY)
Admission: RE | Admit: 2024-04-02 | Discharge: 2024-04-02 | Disposition: A | Discharge status: Home / Self Care | Source: Ambulatory Visit | Attending: Licensed Practical Nurse | Admitting: Licensed Practical Nurse

## 2024-04-02 VITALS — BP 126/78 | HR 85 | Wt 216.8 lb

## 2024-04-02 DIAGNOSIS — Z113 Encounter for screening for infections with a predominantly sexual mode of transmission: Secondary | ICD-10-CM

## 2024-04-02 DIAGNOSIS — Z3480 Encounter for supervision of other normal pregnancy, unspecified trimester: Secondary | ICD-10-CM | POA: Insufficient documentation

## 2024-04-02 DIAGNOSIS — Z1379 Encounter for other screening for genetic and chromosomal anomalies: Secondary | ICD-10-CM

## 2024-04-02 DIAGNOSIS — O219 Vomiting of pregnancy, unspecified: Secondary | ICD-10-CM

## 2024-04-02 DIAGNOSIS — Z3481 Encounter for supervision of other normal pregnancy, first trimester: Secondary | ICD-10-CM | POA: Diagnosis not present

## 2024-04-02 DIAGNOSIS — G44221 Chronic tension-type headache, intractable: Secondary | ICD-10-CM

## 2024-04-02 DIAGNOSIS — Z3A12 12 weeks gestation of pregnancy: Secondary | ICD-10-CM

## 2024-04-02 DIAGNOSIS — Z369 Encounter for antenatal screening, unspecified: Secondary | ICD-10-CM

## 2024-04-02 MED ORDER — ONDANSETRON 4 MG PO TBDP: 4.0000 mg | ORAL_TABLET | Freq: Four times a day (QID) | ORAL | 3 refills | Status: DC | PRN

## 2024-04-02 MED ORDER — ASPIRIN 81 MG PO CHEW: 81.0000 mg | CHEWABLE_TABLET | Freq: Every day | ORAL | 6 refills | Status: DC
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
## 2024-04-02 NOTE — Patient Instructions (Signed)
 Medications to prevent headaches:  Magnesium 500 mg daily B2 400 mg daily Coenzyme q10 150 mg daily Ferrous Sulfate  65 mg elemental iron daily

## 2024-04-03 ENCOUNTER — Encounter: Payer: Self-pay | Admitting: Neurology

## 2024-04-03 LAB — PROTEIN / CREATININE RATIO, URINE
Creatinine, Urine: 214.8 mg/dL
Protein, Ur: 14.3 mg/dL
Protein/Creat Ratio: 67 mg/g{creat} (ref 0–200)

## 2024-04-03 LAB — URINALYSIS, ROUTINE W REFLEX MICROSCOPIC
Bilirubin, UA: NEGATIVE
Glucose, UA: NEGATIVE
Leukocytes,UA: NEGATIVE
Nitrite, UA: NEGATIVE
RBC, UA: NEGATIVE
Specific Gravity, UA: >=1.03 — AB (ref 1.005–1.030)
Urobilinogen, Ur: 0.2 mg/dL (ref 0.2–1.0)
pH, UA: 6.5 (ref 5.0–7.5)

## 2024-04-03 LAB — CERVICOVAGINAL ANCILLARY ONLY
Chlamydia: NEGATIVE
Comment: NEGATIVE
Comment: NORMAL
Neisseria Gonorrhea: NEGATIVE

## 2024-04-04 LAB — MONITOR DRUG PROFILE 14(MW)
Amphetamine Scrn, Ur: NEGATIVE ng/mL
BARBITURATE SCREEN URINE: NEGATIVE ng/mL
BENZODIAZEPINE SCREEN, URINE: NEGATIVE ng/mL
Buprenorphine, Urine: NEGATIVE ng/mL
CANNABINOIDS UR QL SCN: NEGATIVE ng/mL
Cocaine (Metab) Scrn, Ur: NEGATIVE ng/mL
Creatinine(Crt), U: 212.7 mg/dL (ref 20.0–300.0)
Fentanyl, Urine: NEGATIVE pg/mL
Meperidine Screen, Urine: NEGATIVE ng/mL
Methadone Screen, Urine: NEGATIVE ng/mL
OXYCODONE+OXYMORPHONE UR QL SCN: NEGATIVE ng/mL
Opiate Scrn, Ur: NEGATIVE ng/mL
Ph of Urine: 6 (ref 4.5–8.9)
Phencyclidine Qn, Ur: NEGATIVE ng/mL
Propoxyphene Scrn, Ur: NEGATIVE ng/mL
SPECIFIC GRAVITY: 1.03
Tramadol Screen, Urine: NEGATIVE ng/mL

## 2024-04-04 LAB — CULTURE, OB URINE

## 2024-04-04 LAB — NICOTINE SCREEN, URINE: Cotinine Ql Scrn, Ur: NEGATIVE ng/mL

## 2024-04-04 LAB — URINE CULTURE, OB REFLEX

## 2024-04-06 LAB — MATERNIT 21 PLUS CORE, BLOOD
Fetal Fraction: 13
Result (T21): NEGATIVE
Trisomy 13 (Patau syndrome): NEGATIVE
Trisomy 18 (Edwards syndrome): NEGATIVE
Trisomy 21 (Down syndrome): NEGATIVE

## 2024-04-08 ENCOUNTER — Ambulatory Visit: Admitting: Licensed Clinical Social Worker

## 2024-04-08 DIAGNOSIS — F411 Generalized anxiety disorder: Secondary | ICD-10-CM | POA: Diagnosis not present

## 2024-04-08 LAB — CBC/D/PLT+RPR+RH+ABO+RUBIGG...
Antibody Screen: NEGATIVE
Basophils Absolute: 0 x10E3/uL (ref 0.0–0.2)
Basos: 0 %
EOS (ABSOLUTE): 0 x10E3/uL (ref 0.0–0.4)
Eos: 0 %
HCV Ab: NONREACTIVE
HIV Screen 4th Generation wRfx: NONREACTIVE
Hematocrit: 36.2 % (ref 34.0–46.6)
Hemoglobin: 11.7 g/dL (ref 11.1–15.9)
Hepatitis B Surface Ag: NEGATIVE
Immature Grans (Abs): 0 x10E3/uL (ref 0.0–0.1)
Immature Granulocytes: 0 %
Lymphocytes Absolute: 2 x10E3/uL (ref 0.7–3.1)
Lymphs: 25 %
MCH: 27 pg (ref 26.6–33.0)
MCHC: 32.3 g/dL (ref 31.5–35.7)
MCV: 83 fL (ref 79–97)
Monocytes Absolute: 0.5 x10E3/uL (ref 0.1–0.9)
Monocytes: 6 %
Neutrophils Absolute: 5.6 x10E3/uL (ref 1.4–7.0)
Neutrophils: 68 %
Platelets: 206 x10E3/uL (ref 150–450)
RBC: 4.34 x10E6/uL (ref 3.77–5.28)
RDW: 14.5 % (ref 11.7–15.4)
RPR Ser Ql: NONREACTIVE
Rh Factor: POSITIVE
Rubella Antibodies, IGG: 1.62 {index} (ref >0.99)
Varicella zoster IgG: REACTIVE
WBC: 8.3 x10E3/uL (ref 3.4–10.8)

## 2024-04-08 LAB — COMPREHENSIVE METABOLIC PANEL WITH GFR
ALT: 8 IU/L (ref 0–32)
AST: 14 IU/L (ref 0–40)
Albumin: 3.9 g/dL — ABNORMAL LOW (ref 4.0–5.0)
Alkaline Phosphatase: 53 IU/L (ref 41–116)
BUN/Creatinine Ratio: 13 (ref 9–23)
BUN: 7 mg/dL (ref 6–20)
Bilirubin Total: 0.2 mg/dL (ref 0.0–1.2)
CO2: 17 mmol/L — ABNORMAL LOW (ref 20–29)
Calcium: 8.7 mg/dL (ref 8.7–10.2)
Chloride: 103 mmol/L (ref 96–106)
Creatinine, Ser: 0.52 mg/dL — ABNORMAL LOW (ref 0.57–1.00)
Globulin, Total: 2.4 g/dL (ref 1.5–4.5)
Glucose: 101 mg/dL — ABNORMAL HIGH (ref 70–99)
Potassium: 3.9 mmol/L (ref 3.5–5.2)
Sodium: 136 mmol/L (ref 134–144)
Total Protein: 6.3 g/dL (ref 6.0–8.5)
eGFR: 131 mL/min/1.73 (ref >59)

## 2024-04-08 LAB — HGB FRACTIONATION CASCADE
Hgb A2: 2.4 % (ref 1.8–3.2)
Hgb A: 97.6 % (ref 96.4–98.8)
Hgb F: 0 % (ref 0.0–2.0)
Hgb S: 0 %

## 2024-04-08 LAB — HEMOGLOBIN A1C
Est. average glucose Bld gHb Est-mCnc: 114 mg/dL
Hgb A1c MFr Bld: 5.6 % (ref 4.8–5.6)

## 2024-04-08 LAB — HCV INTERPRETATION

## 2024-04-08 NOTE — Progress Notes (Signed)
 Counselor/Therapist Progress Note  Patient ID: Felicia Jones, MRN: [id number],    Date: 04/08/2024  Time Spent: 61 total minutes. I spent 56 minutes on real-time audio and video with the patient on the date of service. I spent an additional 7 minutes on pre- and post-visit activities on the date of service including collateral, chart review, team discussion, and documentation.   Treatment Type: Individual Therapy  Reported Symptoms: Intermittent anxiety, anxious thoughts, irritability   Mental Status Exam:  Appearance:   Casual     Behavior:  Appropriate, Sharing, and Motivated  Motor:  Normal  Speech/Language:   Clear and Coherent and Normal Rate  Affect:  Appropriate, Congruent, and Full Range  Mood:  euthymic  Thought process:  normal  Thought content:    WNL  Sensory/Perceptual disturbances:    WNL  Orientation:  oriented to person, place, time/date, situation, and day of week  Attention:  Good  Concentration:  Good  Memory:  WNL  Fund of knowledge:   Good  Insight:    Fair  Judgment:   Fair  Impulse Control:  Fair   Risk Assessment: Danger to Self:  No Self-injurious Behavior: No Danger to Others: No Duty to Warn:no Physical Aggression / Violence:No  Access to Firearms a concern: No  Gang Involvement:No   Subjective: Patient reports feeling excited all weekend after learning she is having a girl. She also describes experiencing irritability, which she finds challenging and does not want to feel. The patient shares that she has tried meditating a few times but has struggled to find meditations with a tone of voice that she likes. She was engaged and cooperative throughout the session, using the time to discuss her thoughts, feelings, and coping strategies.  Interventions: Cognitive Behavioral Therapy and Client Centered  patient to identify needs related to stressors and overall functioning, while assessing and monitoring for signs and symptoms of anxiety, depressed mood,  and safety concerns. The LCSW checked in on the patient's well-being since the last follow-up, including her excitement about the baby's gender. A supportive space was provided to encourage emotional release and processing of current psychosocial stressors related to daily responsibilities, parenting, and household management. The LCSW validated the patient's frustrations and feelings of irritability, helped identify thoughts contributing to increased distress, reframed these thoughts, and reviewed mindfulness techniques.   Diagnosis:   ICD-10-CM   1. Generalized anxiety disorder  F41.1       Plan: Patient shares that she wants to continue to work on her previous goal of treatment.    Patient's goal of treatment is how to not get so aggravated so fast, not feel so anxious all the time and have so many worries, learn to not stress so much.    Goal 1: Improve emotional regulation and reduce frequency and intensity of emotional reactivity (e.g., aggravation). Measurable Outcome:  Patient will report a 50% reduction in the frequency and intensity of feeling aggravated within 12 months, as measured by self-report and clinical observation.  Interventions:  Use CBT techniques to identify and reframe automatic thoughts contributing to emotional escalation.  Apply ACT strategies to increase psychological flexibility and decrease reactivity to difficult emotions.  Mindfulness practices (e.g., mindful pauses, breath awareness) to build tolerance for discomfort and reduce impulsive responses.  Implement somatic resourcing techniques (e.g., grounding, body scans) to support nervous system regulation during moments of distress.  Goal 2: Reduce overall anxiety and excessive worry. Measurable Outcome:  Patient will report a decrease in anxiety symptoms from "daily  and overwhelming" to "occasional and manageable" within 12 months, as measured by clinical assessments and  self-report.  Interventions:  Use CBT interventions to challenge and restructure maladaptive worry patterns and catastrophic thinking.  Practice defusion techniques from ACT (e.g., labeling thoughts, "I'm having the thought that.") to reduce attachment to anxious thoughts.  Guide patient in mindfulness-based stress reduction (MBSR) to observe anxious thoughts without judgment.  Use somatic tracking to increase awareness of bodily sensations related to anxiety and introduce techniques to shift out of hyperarousal (e.g., orienting, pendulation).  Goal 3: Increase ability to manage stress effectively and enhance overall coping strategies. Measurable Outcome:  Patient will identify and consistently use at least 3 effective coping strategies for managing stress, demonstrating use in 80% of reported high-stress situations over the next 12 months.  Interventions:  Facilitate identification of personal stress triggers and co-develop a CBT-based coping plan.  Integrate ACT values clarification to help patient act in alignment with what matters most, even under stress.  Teach and reinforce daily mindfulness routines to promote present-moment awareness and reduce rumination.  Use somatic resourcing and regulation exercises (e.g., resourcing, tapping, movement) to support embodied stress relief and resilience.  Future Appointments  Date Time Provider Department Center  04/14/2024 10:00 AM Ellender Palma, LCSW [facility] None  04/30/2024  8:55 AM Justino Eleanor HERO, CNM [facility] None  07/10/2024  9:30 AM Leigh Venetia CROME, MD [facility] None   Palma Ellender, LCSW

## 2024-04-14 ENCOUNTER — Ambulatory Visit: Admitting: Licensed Clinical Social Worker

## 2024-04-14 DIAGNOSIS — F411 Generalized anxiety disorder: Secondary | ICD-10-CM

## 2024-04-14 NOTE — Progress Notes (Signed)
 Counselor/Therapist Progress Note  Patient ID: Felicia Jones, MRN: [id number],    Date: 04/14/2024  Time Spent: 52 total minutes. I spent 45 minutes on real-time audio and video with the patient on the date of service. I spent an additional 7 minutes on pre- and post-visit activities on the date of service including collateral, chart review, team discussion, and documentation.    Treatment Type: Individual Therapy  Reported Symptoms: Overall Mood stability; intermittent mild anxiety, anxious thoughts    Mental Status Exam:  Appearance:   Casual     Behavior:  Appropriate, Sharing, and Motivated  Motor:  Normal  Speech/Language:   Clear and Coherent and Normal Rate  Affect:  Appropriate, Congruent, and Full Range  Mood:  euthymic  Thought process:  normal  Thought content:    WNL  Sensory/Perceptual disturbances:    WNL  Orientation:  oriented to person, place, time/date, situation, and day of week  Attention:  Good  Concentration:  Good  Memory:  WNL  Fund of knowledge:   Good  Insight:    Fair  Judgment:   Fair  Impulse Control:  Fair   Risk Assessment: Danger to Self:  No Self-injurious Behavior: No Danger to Others: No Duty to Warn:no Physical Aggression / Violence:No  Access to Firearms a concern: No  Gang Involvement:No   Subjective: Patient reports that her mood has been pretty good a little nauseas and a little aggravated sometimes due to multiple stressors.   Patient was engaged and cooperative throughout the session discussing thoughts and feelings.   Interventions: Cognitive Behavioral Therapy, Mindfulness Meditation, and Client Centered  LCSW established psychological safety. LCSW met with patient to identify needs related to stressors and functioning, and assess and monitor for signs and symptoms of anxiety and depressed mood, and assess safety. Checked in with patient regarding how they have been doing since the last follow-up session. LCSW supported  patient in processing thoughts and emotions related to intermittent stress and feelings of overwhelm due to multiple stressors, including physical symptoms such as headaches, migraines, and irritability. Discussed practical strategies to reduce overstimulation, including setting boundaries and implementing environmental modifications (e.g., encouraging use of headsets for children to reduce noise). LCSW guided patient through a mindfulness-based Polyvagal experiential exercise to engage in the "3-5 green state" regulation technique. Encouraged patient to incorporate mindfulness practices throughout the day and to engage in daily guided meditation to support emotional regulation and stress reduction. In addition, LCSW and patient briefly discussed values and judgements.   Diagnosis:   ICD-10-CM   1. Generalized anxiety disorder  F41.1      Plan: Patient shares that she wants to continue to work on her previous goal of treatment.    Patient's goal of treatment is how to not get so aggravated so fast, not feel so anxious all the time and have so many worries, learn to not stress so much.    Goal 1: Improve emotional regulation and reduce frequency and intensity of emotional reactivity (e.g., aggravation). Measurable Outcome:  Patient will report a 50% reduction in the frequency and intensity of feeling aggravated within 12 months, as measured by self-report and clinical observation.  Interventions:  Use CBT techniques to identify and reframe automatic thoughts contributing to emotional escalation.  Apply ACT strategies to increase psychological flexibility and decrease reactivity to difficult emotions.  Mindfulness practices (e.g., mindful pauses, breath awareness) to build tolerance for discomfort and reduce impulsive responses.  Implement somatic resourcing techniques (e.g., grounding, body scans) to support  nervous system regulation during moments of distress.  Goal 2: Reduce overall  anxiety and excessive worry. Measurable Outcome:  Patient will report a decrease in anxiety symptoms from "daily and overwhelming" to "occasional and manageable" within 12 months, as measured by clinical assessments and self-report.  Interventions:  Use CBT interventions to challenge and restructure maladaptive worry patterns and catastrophic thinking.  Practice defusion techniques from ACT (e.g., labeling thoughts, "I'm having the thought that.") to reduce attachment to anxious thoughts.  Guide patient in mindfulness-based stress reduction (MBSR) to observe anxious thoughts without judgment.  Use somatic tracking to increase awareness of bodily sensations related to anxiety and introduce techniques to shift out of hyperarousal (e.g., orienting, pendulation).  Goal 3: Increase ability to manage stress effectively and enhance overall coping strategies. Measurable Outcome:  Patient will identify and consistently use at least 3 effective coping strategies for managing stress, demonstrating use in 80% of reported high-stress situations over the next 12 months.  Interventions:  Facilitate identification of personal stress triggers and co-develop a CBT-based coping plan.  Integrate ACT values clarification to help patient act in alignment with what matters most, even under stress.  Teach and reinforce daily mindfulness routines to promote present-moment awareness and reduce rumination.  Use somatic resourcing and regulation exercises (e.g., resourcing, tapping, movement) to support embodied stress relief and resilience.  Future Appointments  Date Time Provider Department Center  04/22/2024  9:00 AM Ellender Palma, LCSW [facility] None  04/30/2024  8:55 AM Justino Eleanor HERO, CNM [facility] None  07/10/2024  9:30 AM Leigh Venetia CROME, MD [facility] None    Palma Ellender, LCSW

## 2024-04-22 ENCOUNTER — Ambulatory Visit: Admitting: Licensed Clinical Social Worker

## 2024-04-22 DIAGNOSIS — F411 Generalized anxiety disorder: Secondary | ICD-10-CM | POA: Diagnosis not present
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
## 2024-04-29 ENCOUNTER — Ambulatory Visit: Admitting: Licensed Clinical Social Worker

## 2024-04-29 DIAGNOSIS — F411 Generalized anxiety disorder: Secondary | ICD-10-CM

## 2024-04-29 NOTE — Progress Notes (Signed)
 Counselor/Therapist Progress Note  Patient ID: Felicia Jones, MRN: [id number],    Date: 04/29/2024  Time Spent: 57 total minutes. I spent 50 minutes on real-time audio and video with the patient on the date of service. I spent an additional 7  minutes on pre- and post-visit activities on the date of service including collateral, chart review, team discussion, and documentation.   Treatment Type: Individual Therapy  Reported Symptoms: Anxiety, anxious thoughts  Mental Status Exam:  Appearance:   Casual     Behavior:  Appropriate, Sharing, and Motivated  Motor:  Normal  Speech/Language:   Clear and Coherent and Normal Rate  Affect:  Appropriate, Congruent, and Full Range  Mood:  normal  Thought process:  normal  Thought content:    WNL  Sensory/Perceptual disturbances:    WNL  Orientation:  oriented to person, place, time/date, situation, and day of week  Attention:  Good  Concentration:  Good  Memory:  WNL  Fund of knowledge:   Good  Insight:    Fair  Judgment:   Fair  Impulse Control:  Fair   Risk Assessment: Danger to Self:  No Self-injurious Behavior: No Danger to Others: No Duty to Warn:no Physical Aggression / Violence:No  Access to Firearms a concern: No  Gang Involvement:No   Subjective:  Patient reports that she has been feeling more anxious specifically regarding the health of her pregnancy. She reports that over all her mood has been stable/okay.   Interventions: Cognitive Behavioral Therapy, Mindfulness Meditation, and Client Centered  LCSW established psychological safety. LCSW met with patient to identify needs related to stressors and functioning, and assess and monitor for signs and symptoms of anxiety and any mood symptoms, and assess safety. Checked in with patient regarding how they have been doing since the last follow-up session. LCSW supported patient in processing thoughts and emotions related to the health of her pregnancy and interactions with extended  family. LCSW provided a supportive and validating environment, normalizing the patient's emotional responses and promoting self-compassion. Using a mindfulness and somatic approach, LCSW guided the patient in noticing where she experiences feelings of anxiety and anger in her body, encouraging her to observe these sensations without judgment. The patient was encouraged to stay present with her internal experience and practice gentle awareness of her emotions and bodily cues. LCSW and patient discussed questions and concerns about her pregnancy, validating her need for information and emotional expression. Reviewed self-soothing techniques and encouraged continued engagement in mindfulness practices to support emotional regulation. A mindfulness resource was provided for home practice.  Diagnosis:   ICD-10-CM   1. Generalized anxiety disorder  F41.1      Plan: Patient shares that she wants to continue to work on her previous goal of treatment.    Patient's goal of treatment is how to not get so aggravated so fast, not feel so anxious all the time and have so many worries, learn to not stress so much.    Goal 1: Improve emotional regulation and reduce frequency and intensity of emotional reactivity (e.g., aggravation). Measurable Outcome:  Patient will report a 50% reduction in the frequency and intensity of feeling aggravated within 12 months, as measured by self-report and clinical observation.  Interventions:  Use CBT techniques to identify and reframe automatic thoughts contributing to emotional escalation.  Apply ACT strategies to increase psychological flexibility and decrease reactivity to difficult emotions.  Mindfulness practices (e.g., mindful pauses, breath awareness) to build tolerance for discomfort and reduce impulsive responses.  Implement  somatic resourcing techniques (e.g., grounding, body scans) to support nervous system regulation during moments of distress.  Goal 2:  Reduce overall anxiety and excessive worry. Measurable Outcome:  Patient will report a decrease in anxiety symptoms from "daily and overwhelming" to "occasional and manageable" within 12 months, as measured by clinical assessments and self-report.  Interventions:  Use CBT interventions to challenge and restructure maladaptive worry patterns and catastrophic thinking.  Practice defusion techniques from ACT (e.g., labeling thoughts, "I'm having the thought that.") to reduce attachment to anxious thoughts.  Guide patient in mindfulness-based stress reduction (MBSR) to observe anxious thoughts without judgment.  Use somatic tracking to increase awareness of bodily sensations related to anxiety and introduce techniques to shift out of hyperarousal (e.g., orienting, pendulation).  Goal 3: Increase ability to manage stress effectively and enhance overall coping strategies. Measurable Outcome:  Patient will identify and consistently use at least 3 effective coping strategies for managing stress, demonstrating use in 80% of reported high-stress situations over the next 12 months.  Interventions:  Facilitate identification of personal stress triggers and co-develop a CBT-based coping plan.  Integrate ACT values clarification to help patient act in alignment with what matters most, even under stress.  Teach and reinforce daily mindfulness routines to promote present-moment awareness and reduce rumination.  Use somatic resourcing and regulation exercises (e.g., resourcing, tapping, movement) to support embodied stress relief and resilience.  Future Appointments  Date Time Provider Department Center  04/30/2024  8:55 AM Justino Eleanor HERO, CNM [facility] None  05/06/2024 11:00 AM Ellender Palma, LCSW [facility] None  07/10/2024  9:30 AM Leigh Venetia CROME, MD [facility] None    Palma Ellender, LCSW
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
## 2024-04-30 ENCOUNTER — Encounter: Payer: Self-pay | Admitting: Obstetrics

## 2024-04-30 ENCOUNTER — Ambulatory Visit (INDEPENDENT_AMBULATORY_CARE_PROVIDER_SITE_OTHER): Admitting: Obstetrics

## 2024-04-30 VITALS — BP 101/63 | HR 94 | Wt 220.3 lb

## 2024-04-30 DIAGNOSIS — Z362 Encounter for other antenatal screening follow-up: Secondary | ICD-10-CM

## 2024-04-30 DIAGNOSIS — F411 Generalized anxiety disorder: Secondary | ICD-10-CM

## 2024-04-30 DIAGNOSIS — O99342 Other mental disorders complicating pregnancy, second trimester: Secondary | ICD-10-CM | POA: Diagnosis not present

## 2024-04-30 DIAGNOSIS — Z3A16 16 weeks gestation of pregnancy: Secondary | ICD-10-CM

## 2024-04-30 DIAGNOSIS — Z1379 Encounter for other screening for genetic and chromosomal anomalies: Secondary | ICD-10-CM

## 2024-04-30 DIAGNOSIS — Z3480 Encounter for supervision of other normal pregnancy, unspecified trimester: Secondary | ICD-10-CM

## 2024-04-30 MED ORDER — HYDROXYZINE HCL 25 MG PO TABS: 25.0000 mg | ORAL_TABLET | Freq: Every evening | ORAL | 2 refills | Status: DC | PRN

## 2024-04-30 NOTE — Assessment & Plan Note (Addendum)
-  Discussed rationale for ASA for prevention of preeclampsia -Reviewed antenatal testing, serial growth scans -Anatomy US  ordered -Discussed AFP. She will make a lab-only visit. -Reviewed when to seek medical care

## 2024-04-30 NOTE — Assessment & Plan Note (Signed)
-  Seeing therapist. Considering SSRI but she feels a lot of her anxiety is situational -Rx sent for hydroxyzine

## 2024-05-06 ENCOUNTER — Ambulatory Visit: Admitting: Licensed Clinical Social Worker

## 2024-05-06 DIAGNOSIS — F411 Generalized anxiety disorder: Secondary | ICD-10-CM

## 2024-05-06 NOTE — Progress Notes (Signed)
 Counselor/Therapist Progress Note  Patient ID: Felicia Jones, MRN: [id number],    Date: 05/06/2024  Time Spent: 47 total minutes. I spent 40 minutes on real-time audio with the patient on the date of service. I spent an additional 7 minutes on pre- and post-visit activities on the date of service including collateral, chart review, team discussion, and documentation.   Patient arrived 15 minutes late to session.   Treatment Type: Individual Therapy  Reported Symptoms: Anxiety, anxious thoughts   Mental Status Exam:  Appearance:   NA     Behavior:  Appropriate, Sharing, and Motivated  Motor:  NA  Speech/Language:   Clear and Coherent and Normal Rate  Affect:  NA  Mood:  normal  Thought process:  normal  Thought content:    WNL  Sensory/Perceptual disturbances:    WNL  Orientation:  oriented to person, place, time/date, situation, and day of week  Attention:  Fair  Concentration:  Fair  Memory:  WNL  Fund of knowledge:   Good  Insight:    Fair  Judgment:   Fair  Impulse Control:  Fair   Risk Assessment: Danger to Self:  No Self-injurious Behavior: No Danger to Others: No Duty to Warn:no Physical Aggression / Violence:No  Access to Firearms a concern: No  Gang Involvement:No   Subjective: Patient reports a long aggravating weekend due to interactions with her in-laws. She was engaged and cooperative discussing thoughts and feelings throughout the session.   Interventions: Cognitive Behavioral Therapy and Client Centered  LCSW established psychological safety. Met with patient to assess current needs related to stressors and daily functioning. Monitored for symptoms of anxiety assessed for safety. Conducted a follow-up check-in on the patient's well-being since the last session. LCSW assisted patient in processing their thoughts and emotions regarding interactions with her in-laws and with her husband.  LCSW validated patient's feelings of frustration and ressentiment.  Reviewed mindfulness practices, and discussed options to engage in increased self-care to regulate her emotions, including spending time at her mom's going for a walk, and journaling. Provided support through active listening, validation of feelings, and highlighted patient's strengths.  Diagnosis:   ICD-10-CM   1. Generalized anxiety disorder  F41.1      Plan: Patient shares that she wants to continue to work on her previous goal of treatment.    Patient's goal of treatment is how to not get so aggravated so fast, not feel so anxious all the time and have so many worries, learn to not stress so much.    Goal 1: Improve emotional regulation and reduce frequency and intensity of emotional reactivity (e.g., aggravation). Measurable Outcome:  Patient will report a 50% reduction in the frequency and intensity of feeling aggravated within 12 months, as measured by self-report and clinical observation.  Interventions:  Use CBT techniques to identify and reframe automatic thoughts contributing to emotional escalation.  Apply ACT strategies to increase psychological flexibility and decrease reactivity to difficult emotions.  Mindfulness practices (e.g., mindful pauses, breath awareness) to build tolerance for discomfort and reduce impulsive responses.  Implement somatic resourcing techniques (e.g., grounding, body scans) to support nervous system regulation during moments of distress.  Goal 2: Reduce overall anxiety and excessive worry. Measurable Outcome:  Patient will report a decrease in anxiety symptoms from "daily and overwhelming" to "occasional and manageable" within 12 months, as measured by clinical assessments and self-report.  Interventions:  Use CBT interventions to challenge and restructure maladaptive worry patterns and catastrophic thinking.  Practice defusion techniques  from ACT (e.g., labeling thoughts, "I'm having the thought that.") to reduce attachment to anxious  thoughts.  Guide patient in mindfulness-based stress reduction (MBSR) to observe anxious thoughts without judgment.  Use somatic tracking to increase awareness of bodily sensations related to anxiety and introduce techniques to shift out of hyperarousal (e.g., orienting, pendulation).  Goal 3: Increase ability to manage stress effectively and enhance overall coping strategies. Measurable Outcome:  Patient will identify and consistently use at least 3 effective coping strategies for managing stress, demonstrating use in 80% of reported high-stress situations over the next 12 months.  Interventions: Facilitate identification of personal stress triggers and co-develop a CBT-based coping plan.  Integrate ACT values clarification to help patient act in alignment with what matters most, even under stress.  Teach and reinforce daily mindfulness routines to promote present-moment awareness and reduce rumination.  Use somatic resourcing and regulation exercises (e.g., resourcing, tapping, movement) to support embodied stress relief and resilience.    Future Appointments  Date Time Provider Department Center  05/07/2024  8:40 AM [facility] None  05/14/2024 11:30 AM Ellender Palma, LCSW [facility] None  05/21/2024  1:00 PM [facility] US  1 [facility] None  05/28/2024  9:35 AM Jayne Harlene CROME, CNM [facility] None  07/10/2024  9:30 AM Leigh Venetia CROME, MD [facility] None    Palma Ellender, LCSW

## 2024-05-07 ENCOUNTER — Other Ambulatory Visit

## 2024-05-08 ENCOUNTER — Other Ambulatory Visit

## 2024-05-09 ENCOUNTER — Other Ambulatory Visit

## 2024-05-09 DIAGNOSIS — Z1379 Encounter for other screening for genetic and chromosomal anomalies: Secondary | ICD-10-CM

## 2024-05-12 LAB — AFP, SERUM, OPEN SPINA BIFIDA
AFP MoM: 1.56
AFP Value: 53.4 ng/mL
Gest. Age on Collection Date: 17.3 wk
Maternal Age At EDD: 27.1 a
OSBR Risk 1 IN: 4661
Test Results:: NEGATIVE
Weight: 217 [lb_av]

## 2024-05-14 ENCOUNTER — Ambulatory Visit: Payer: Self-pay | Admitting: Obstetrics

## 2024-05-14 ENCOUNTER — Ambulatory Visit: Admitting: Licensed Clinical Social Worker

## 2024-05-14 NOTE — Progress Notes (Unsigned)
 Counselor/Therapist Progress Note  Patient ID: Felicia Jones, MRN: [id number],    Date: 05/14/2024  Time Spent: ## total minutes. I spent ## minutes on real-time audio and video OR face to face with the patient on the date of service. I spent an additional ##  minutes on pre- and post-visit activities on the date of service including collateral, chart review, team discussion, and documentation.    Treatment Type: Psychotherapy  Reported Symptoms: {CHL AMB Reported [phone]}  Mental Status Exam:  Appearance:   {PSY:22683}     Behavior:  {PSY:21022743}  Motor:  {PSY:22302}  Speech/Language:   {PSY:22685}  Affect:  {PSY:22687}  Mood:  {PSY:31886}  Thought process:  {PSY:31888}  Thought content:    {PSY:819-167-8412}  Sensory/Perceptual disturbances:    {PSY:6265495354}  Orientation:  {PSY:30297}  Attention:  {PSY:22877}  Concentration:  {PSY:917-742-0322}  Memory:  {PSY:212-147-3221}  Fund of knowledge:   {PSY:917-742-0322}  Insight:    {PSY:917-742-0322}  Judgment:   {PSY:917-742-0322}  Impulse Control:  {PSY:917-742-0322}   Risk Assessment: Danger to Self:  {PSY:22692} Self-injurious Behavior: {PSY:22692} Danger to Others: {PSY:22692} Duty to Warn:{PSY:311194} Physical Aggression / Violence:{PSY:21197} Access to Firearms a concern: {PSY:21197} Gang Involvement:{PSY:21197}  Subjective: Patient reports    Interventions: {[phone]}  Diagnosis:   ICD-10-CM   1. Generalized anxiety disorder  F41.1       Plan: Patient shares that she wants to continue to work on her previous goal of treatment.    Patient's goal of treatment is how to not get so aggravated so fast, not feel so anxious all the time and have so many worries, learn to not stress so much.    Goal 1: Improve emotional regulation and reduce frequency and intensity of emotional reactivity (e.g., aggravation). Measurable Outcome:  Patient will report a 50% reduction in the frequency and intensity of  feeling aggravated within 12 months, as measured by self-report and clinical observation.  Interventions:  Use CBT techniques to identify and reframe automatic thoughts contributing to emotional escalation.  Apply ACT strategies to increase psychological flexibility and decrease reactivity to difficult emotions.  Mindfulness practices (e.g., mindful pauses, breath awareness) to build tolerance for discomfort and reduce impulsive responses.  Implement somatic resourcing techniques (e.g., grounding, body scans) to support nervous system regulation during moments of distress.  Goal 2: Reduce overall anxiety and excessive worry. Measurable Outcome:  Patient will report a decrease in anxiety symptoms from "daily and overwhelming" to "occasional and manageable" within 12 months, as measured by clinical assessments and self-report.  Interventions:  Use CBT interventions to challenge and restructure maladaptive worry patterns and catastrophic thinking.  Practice defusion techniques from ACT (e.g., labeling thoughts, "I'm having the thought that.") to reduce attachment to anxious thoughts.  Guide patient in mindfulness-based stress reduction (MBSR) to observe anxious thoughts without judgment.  Use somatic tracking to increase awareness of bodily sensations related to anxiety and introduce techniques to shift out of hyperarousal (e.g., orienting, pendulation).  Goal 3: Increase ability to manage stress effectively and enhance overall coping strategies. Measurable Outcome:  Patient will identify and consistently use at least 3 effective coping strategies for managing stress, demonstrating use in 80% of reported high-stress situations over the next 12 months.   Interventions: Facilitate identification of personal stress triggers and co-develop a CBT-based coping plan.  Integrate ACT values clarification to help patient act in alignment with what matters most, even under stress.  Teach and  reinforce daily mindfulness routines to promote present-moment awareness and reduce rumination.  Use somatic resourcing  and regulation exercises (e.g., resourcing, tapping, movement) to support embodied stress relief and resilience.   Future Appointments  Date Time Provider Department Center  05/14/2024 11:30 AM Ellender Palma, LCSW [facility] None  05/21/2024  1:00 PM [facility] US  1 [facility] None  05/28/2024  9:35 AM Jayne Harlene CROME, CNM [facility] None  07/10/2024  9:30 AM Leigh Venetia CROME, MD [facility] None     Palma Ellender, LCSW

## 2024-05-20 ENCOUNTER — Ambulatory Visit: Admitting: Licensed Clinical Social Worker

## 2024-05-20 DIAGNOSIS — F411 Generalized anxiety disorder: Secondary | ICD-10-CM

## 2024-05-20 NOTE — Progress Notes (Unsigned)
 Counselor/Therapist Progress Note  Patient ID: Felicia Jones, MRN: [id number],    Date: 05/20/2024  Time Spent: ## total minutes. I spent ## minutes on real-time audio and video OR face to face with the patient on the date of service. I spent an additional ##  minutes on pre- and post-visit activities on the date of service including collateral, chart review, team discussion, and documentation.   Treatment Type: Psychotherapy  Reported Symptoms: {CHL AMB Reported [phone]}  Mental Status Exam:  Appearance:   {PSY:22683}     Behavior:  {PSY:21022743}  Motor:  {PSY:22302}  Speech/Language:   {PSY:22685}  Affect:  {PSY:22687}  Mood:  {PSY:31886}  Thought process:  {PSY:31888}  Thought content:    {PSY:612 619 5847}  Sensory/Perceptual disturbances:    {PSY:725-801-1013}  Orientation:  {PSY:30297}  Attention:  {PSY:22877}  Concentration:  {PSY:8304742232}  Memory:  {PSY:2766348521}  Fund of knowledge:   {PSY:8304742232}  Insight:    {PSY:8304742232}  Judgment:   {PSY:8304742232}  Impulse Control:  {PSY:8304742232}   Risk Assessment: Danger to Self:  No Self-injurious Behavior: No Danger to Others: No Duty to Warn:no Physical Aggression / Violence:No  Access to Firearms a concern: No  Gang Involvement:No   Subjective: Patient reports    Interventions: {PSY:423 626 7186} LCSW established psychological safety.   Diagnosis:   ICD-10-CM   1. Generalized anxiety disorder  F41.1      Plan: Patient shares that she wants to continue to work on her previous goal of treatment.    Patient's goal of treatment is how to not get so aggravated so fast, not feel so anxious all the time and have so many worries, learn to not stress so much.    Goal 1: Improve emotional regulation and reduce frequency and intensity of emotional reactivity (e.g., aggravation). Measurable Outcome:  Patient will report a 50% reduction in the frequency and intensity of feeling aggravated within  12 months, as measured by self-report and clinical observation.  Interventions:  Use CBT techniques to identify and reframe automatic thoughts contributing to emotional escalation.  Apply ACT strategies to increase psychological flexibility and decrease reactivity to difficult emotions.  Mindfulness practices (e.g., mindful pauses, breath awareness) to build tolerance for discomfort and reduce impulsive responses.  Implement somatic resourcing techniques (e.g., grounding, body scans) to support nervous system regulation during moments of distress.  Goal 2: Reduce overall anxiety and excessive worry. Measurable Outcome:  Patient will report a decrease in anxiety symptoms from "daily and overwhelming" to "occasional and manageable" within 12 months, as measured by clinical assessments and self-report.  Interventions:  Use CBT interventions to challenge and restructure maladaptive worry patterns and catastrophic thinking.  Practice defusion techniques from ACT (e.g., labeling thoughts, "I'm having the thought that.") to reduce attachment to anxious thoughts.  Guide patient in mindfulness-based stress reduction (MBSR) to observe anxious thoughts without judgment.  Use somatic tracking to increase awareness of bodily sensations related to anxiety and introduce techniques to shift out of hyperarousal (e.g., orienting, pendulation).  Goal 3: Increase ability to manage stress effectively and enhance overall coping strategies. Measurable Outcome:  Patient will identify and consistently use at least 3 effective coping strategies for managing stress, demonstrating use in 80% of reported high-stress situations over the next 12 months.   Interventions: Facilitate identification of personal stress triggers and co-develop a CBT-based coping plan.  Integrate ACT values clarification to help patient act in alignment with what matters most, even under stress.  Teach and reinforce daily mindfulness  routines to promote present-moment awareness and  reduce rumination.  Use somatic resourcing and regulation exercises (e.g., resourcing, tapping, movement) to support embodied stress relief and resilience.   Future Appointments  Date Time Provider Department Center  05/20/2024  9:00 AM Ellender Palma, LCSW [facility] None  05/21/2024  1:00 PM [facility] US  1 [facility] None  05/28/2024  9:35 AM Jayne Harlene CROME, CNM [facility] None  07/10/2024  9:30 AM Leigh Venetia CROME, MD [facility] None    Palma Ellender, LCSW

## 2024-05-20 NOTE — Progress Notes (Signed)
 Counselor/Therapist Progress Note  Patient ID: Felicia Jones, MRN: [id number],    Date: 05/20/2024  Time Spent: 50 total minutes. I spent 40 minutes on real-time audio and video with the patient on the date of service. I spent an additional 10 minutes on pre- and post-visit activities on the date of service including collateral, chart review, team discussion, and documentation.   Treatment Type: Psychotherapy  Reported Symptoms: intermittent low mood, anxiety, overwhelm, irritability   Mental Status Exam:  Appearance:   Casual     Behavior:  Appropriate, Sharing, and Motivated  Motor:  Normal  Speech/Language:   Clear and Coherent and Normal Rate  Affect:  Appropriate, Congruent, and Restricted  Mood:  normal  Thought process:  normal  Thought content:    WNL  Sensory/Perceptual disturbances:    WNL  Orientation:  oriented to person, place, time/date, situation, and day of week  Attention:  Good  Concentration:  Good  Memory:  WNL  Fund of knowledge:   Good  Insight:    Fair  Judgment:   Fair  Impulse Control:  Fair   Risk Assessment: Danger to Self:  No Self-injurious Behavior: No Danger to Others: No Duty to Warn:no Physical Aggression / Violence:No  Access to Firearms a concern: No  Gang Involvement:No   Subjective: Patient reports, "I don't really know, I am okay, I guess." She shares that she stayed at her mother's home last night and plans to stay there again tonight. The patient states that she enjoyed her time there and was able to get some rest, which felt beneficial. Session discussion primarily focused on challenges within her relationships.  Interventions: Cognitive Behavioral Therapy and Client Centered  LCSW established psychological safety and rapport at the start of the session. LCSW met with the patient to identify current needs related to ongoing stressors and overall functioning. The session focused on assessing and monitoring symptoms of anxiety and  depressed mood, as well as conducting a routine safety assessment. LCSW checked in with the patient regarding their status and functioning since the previous follow-up session.  Using a client-centered and CBT-informed approach, LCSW supported the patient in exploring and processing thoughts and emotions related to ongoing challenges within her relationship with her husband, family of origin, and in-laws. Interventions included active listening, emotional validation, and highlighting the patient's personal strengths and resilience. LCSW gently introduced cognitive reframing strategies to help the patient identify maladaptive thought patterns and consider alternative perspectives. LCSW also facilitated discussion around communication patterns and potential skills to support healthier interactions.  Patient demonstrated limited engagement with CBT interventions and was largely resistant to reframing attempts and communication-focused strategies. Despite this, the patient remained expressive about feelings of frustration and relational stress. LCSW validated the patient's emotional experience while maintaining a supportive, non-directive stance to preserve therapeutic alliance.  Diagnosis:   ICD-10-CM   1. Generalized anxiety disorder  F41.1      Plan: Patient shares that she wants to continue to work on her previous goal of treatment.    Patient's goal of treatment is how to not get so aggravated so fast, not feel so anxious all the time and have so many worries, learn to not stress so much.    Goal 1: Improve emotional regulation and reduce frequency and intensity of emotional reactivity (e.g., aggravation). Measurable Outcome:  Patient will report a 50% reduction in the frequency and intensity of feeling aggravated within 12 months, as measured by self-report and clinical observation.  Interventions:  Use CBT  techniques to identify and reframe automatic thoughts contributing to emotional  escalation.  Apply ACT strategies to increase psychological flexibility and decrease reactivity to difficult emotions.  Mindfulness practices (e.g., mindful pauses, breath awareness) to build tolerance for discomfort and reduce impulsive responses.  Implement somatic resourcing techniques (e.g., grounding, body scans) to support nervous system regulation during moments of distress.  Goal 2: Reduce overall anxiety and excessive worry. Measurable Outcome:  Patient will report a decrease in anxiety symptoms from "daily and overwhelming" to "occasional and manageable" within 12 months, as measured by clinical assessments and self-report.  Interventions:  Use CBT interventions to challenge and restructure maladaptive worry patterns and catastrophic thinking.  Practice defusion techniques from ACT (e.g., labeling thoughts, "I'm having the thought that.") to reduce attachment to anxious thoughts.  Guide patient in mindfulness-based stress reduction (MBSR) to observe anxious thoughts without judgment.  Use somatic tracking to increase awareness of bodily sensations related to anxiety and introduce techniques to shift out of hyperarousal (e.g., orienting, pendulation).  Goal 3: Increase ability to manage stress effectively and enhance overall coping strategies. Measurable Outcome:  Patient will identify and consistently use at least 3 effective coping strategies for managing stress, demonstrating use in 80% of reported high-stress situations over the next 12 months.   Interventions: Facilitate identification of personal stress triggers and co-develop a CBT-based coping plan.  Integrate ACT values clarification to help patient act in alignment with what matters most, even under stress.  Teach and reinforce daily mindfulness routines to promote present-moment awareness and reduce rumination.  Use somatic resourcing and regulation exercises (e.g., resourcing, tapping, movement) to support  embodied stress relief and resilience.   Future Appointments  Date Time Provider Department Center  05/21/2024  1:00 PM [facility] US  1 [facility] None  05/27/2024  9:00 AM Ellender Palma, LCSW [facility] None  05/28/2024  9:35 AM Jayne Harlene CROME, CNM [facility] None  07/10/2024  9:30 AM Leigh Venetia CROME, MD [facility] None    Palma Ellender, LCSW

## 2024-05-21 ENCOUNTER — Ambulatory Visit

## 2024-05-21 DIAGNOSIS — Z369 Encounter for antenatal screening, unspecified: Secondary | ICD-10-CM

## 2024-05-21 DIAGNOSIS — Z3A19 19 weeks gestation of pregnancy: Secondary | ICD-10-CM | POA: Diagnosis not present

## 2024-05-21 DIAGNOSIS — Z3482 Encounter for supervision of other normal pregnancy, second trimester: Secondary | ICD-10-CM | POA: Diagnosis not present

## 2024-05-21 DIAGNOSIS — Z3480 Encounter for supervision of other normal pregnancy, unspecified trimester: Secondary | ICD-10-CM

## 2024-05-27 ENCOUNTER — Ambulatory Visit: Admitting: Licensed Clinical Social Worker

## 2024-05-27 DIAGNOSIS — F411 Generalized anxiety disorder: Secondary | ICD-10-CM | POA: Diagnosis not present

## 2024-05-27 NOTE — Progress Notes (Signed)
 Counselor/Therapist Progress Note  Patient ID: Felicia Jones, MRN: [id number],    Date: 05/27/2024  Time Spent: 50 total minutes. I spent 40 minutes on real-time audio and video with the patient on the date of service. I spent an additional 10 minutes on pre- and post-visit activities on the date of service including collateral, chart review, team discussion, and documentation.    Treatment Type: Individual Therapy  Reported Symptoms: Mild anxiety, anxious thoughts, low energy   Mental Status Exam:  Appearance:   Casual     Behavior:  Appropriate, Sharing, and Motivated  Motor:  Normal  Speech/Language:   Clear and Coherent and Normal Rate  Affect:  Congruent and Full Range  Mood:  normal  Thought process:  normal  Thought content:    WNL  Sensory/Perceptual disturbances:    WNL  Orientation:  oriented to person, place, time/date, situation, and day of week  Attention:  Good  Concentration:  Good  Memory:  WNL  Fund of knowledge:   Good  Insight:    Fair  Judgment:   Fair  Impulse Control:  Fair   Risk Assessment: Danger to Self:  No Self-injurious Behavior: No Danger to Others: No Duty to Warn:no Physical Aggression / Violence:No  Access to Firearms a concern: No  Gang Involvement:No   Subjective: Patient reports she is tired, which she attributes to an increased workload and limited opportunities for rest. She notes that she was able to sleep in this morning and felt noticeably better afterward. Today's session primarily focused on marital stressors, including ongoing challenges within her relationship, as well as difficulties involving her husband's family dynamics.  Interventions: Cognitive Behavioral Therapy, Mindfulness Meditation, and Client Centered  LCSW established psychological safety and rapport at the start of the session. Conducted a brief assessment of the patient's current needs related to ongoing stressors and daily functioning. Monitored for symptoms of  anxiety and depression and assessed for safety; no concerns were identified. Completed a follow-up check-in regarding the patient's well-being and functioning since the previous session.  Utilizing supportive counseling, LCSW provided validation, reflective listening, and emotional support as the patient explored marital challenges and difficulties involving her husband's family. LCSW assisted the patient in processing associated thoughts and emotions, identifying sources of distress, and articulating her internal experience.  Incorporating ACT strategies, LCSW supported the patient in clarifying her personal values related to marriage, boundaries, and family relationships. Guided the patient in distinguishing between values-aligned actions and unhelpful patterns driven by stress or emotional avoidance. Encouraged mindfulness and acceptance-based skills to increase psychological flexibility and promote effective coping in the face of interpersonal challenges.  Diagnosis:   ICD-10-CM   1. Generalized anxiety disorder  F41.1      Plan: Patient shares that she wants to continue to work on her previous goal of treatment.    Patient's goal of treatment is how to not get so aggravated so fast, not feel so anxious all the time and have so many worries, learn to not stress so much.    Goal 1: Improve emotional regulation and reduce frequency and intensity of emotional reactivity (e.g., aggravation). Measurable Outcome:  Patient will report a 50% reduction in the frequency and intensity of feeling aggravated within 12 months, as measured by self-report and clinical observation.  Interventions:  Use CBT techniques to identify and reframe automatic thoughts contributing to emotional escalation.  Apply ACT strategies to increase psychological flexibility and decrease reactivity to difficult emotions.  Mindfulness practices (e.g., mindful pauses, breath awareness)  to build tolerance for discomfort  and reduce impulsive responses.  Implement somatic resourcing techniques (e.g., grounding, body scans) to support nervous system regulation during moments of distress.  Goal 2: Reduce overall anxiety and excessive worry. Measurable Outcome:  Patient will report a decrease in anxiety symptoms from "daily and overwhelming" to "occasional and manageable" within 12 months, as measured by clinical assessments and self-report.  Interventions:  Use CBT interventions to challenge and restructure maladaptive worry patterns and catastrophic thinking.  Practice defusion techniques from ACT (e.g., labeling thoughts, "I'm having the thought that.") to reduce attachment to anxious thoughts.  Guide patient in mindfulness-based stress reduction (MBSR) to observe anxious thoughts without judgment.  Use somatic tracking to increase awareness of bodily sensations related to anxiety and introduce techniques to shift out of hyperarousal (e.g., orienting, pendulation).  Goal 3: Increase ability to manage stress effectively and enhance overall coping strategies. Measurable Outcome:  Patient will identify and consistently use at least 3 effective coping strategies for managing stress, demonstrating use in 80% of reported high-stress situations over the next 12 months.   Interventions: Facilitate identification of personal stress triggers and co-develop a CBT-based coping plan.  Integrate ACT values clarification to help patient act in alignment with what matters most, even under stress.  Teach and reinforce daily mindfulness routines to promote present-moment awareness and reduce rumination.  Use somatic resourcing and regulation exercises (e.g., resourcing, tapping, movement) to support embodied stress relief and resilience.   Future Appointments  Date Time Provider Department Center  05/28/2024  9:35 AM Jayne Harlene CROME, CNM [facility] None  06/03/2024 11:00 AM Ellender Palma, LCSW [facility] None  07/10/2024   9:30 AM Leigh Venetia CROME, MD [facility] None    Palma Ellender, LCSW

## 2024-05-27 NOTE — Progress Notes (Unsigned)
    Return Prenatal Note   Subjective   28 y.o. [id number] at [redacted]w[redacted]d presents for this follow-up prenatal visit.  Patient reports increased anxiety about pregnancy after recent ultrasound due to sonographer's technique & communication. Has questions about what was seen & what it means for pregnancy. Patient reports: Movement: Present Contractions: Not present  Objective   Flow sheet Vitals: Pulse Rate: 82 BP: (!) 109/54 Fetal Heart Rate (bpm): 160 Total weight gain: 10 lb 3.2 oz (4.627 kg)  General Appearance  No acute distress, well appearing, and well nourished Pulmonary   Normal work of breathing Neurologic   Alert and oriented to person, place, and time Psychiatric   Mood and affect within normal limits   Assessment/Plan   Plan  28 y.o. [id number] at [redacted]w[redacted]d presents for follow-up OB visit. Reviewed prenatal record including previous visit note.  Supervision of other normal pregnancy, antepartum Red flag symptoms reviewed. Reviewed ultrasound images, report not yet complete. Discussed with normal NIPT & only EIF seen on ultrasound with normal anatomy noted there is not concern for cardiac problems. Given BMI>40 pre-pregnancy will need q4w growth scans and we discussed MFM referral for more detailed exam, she is in agreement with that and order placed.      Orders Placed This Encounter  Procedures   US  MFM OB DETAIL +14 WK    Standing Status:   Future    Expected Date:   06/27/2024    Expiration Date:   05/28/2025    Reason for Exam (SYMPTOM  OR DIAGNOSIS REQUIRED):   obesity in pregnancy, EIF seen on anatomy u/s    Preferred Location:   [facility]ation [facility] @ [facility]   Return in 4 weeks (on 06/25/2024) for ROB.   Future Appointments  Date Time Provider Department Center  06/03/2024 11:00 AM Ellender Palma, LCSW [facility] None  06/24/2024  9:55 AM Slaughterbeck, Damien, CNM [facility] None  07/10/2024  9:30 AM Leigh Venetia CROME, MD [facility] None    For next  visit:  continue with routine prenatal care     Harlene CROME Cisco, CNM  05/28/2510:05 AM

## 2024-05-27 NOTE — Patient Instructions (Incomplete)
 Second Trimester of Pregnancy  The second trimester of pregnancy is from week 14 through week 27. This is months 4 through 6 of pregnancy. During the second trimester: Morning sickness is less or has stopped. You may have more energy. You may feel hungry more often. At this time, your unborn baby is growing very fast. At the end of the sixth month, the unborn baby may be up to 12 inches long and weigh about 1 pounds. You will likely start to feel the baby move between 16 and 20 weeks of pregnancy. Body changes during your second trimester Your body continues to change during this time. The changes usually go away after your baby is born. Physical changes You will gain more weight. Your belly will get bigger. You may begin to get stretch marks on your hips, belly, and breasts. Your breasts will keep growing and may hurt. You may get dark spots or blotches on your face. A dark line from your belly button to the pubic area may appear. This line is called linea nigra. Your hair may grow faster and get thicker. Health changes You may have headaches. You may have heartburn. You may pee more often. You may have swollen, bulging veins (varicose veins). You may have trouble pooping (constipation), or swollen veins in the butt that can itch or get painful (hemorrhoids). You may have back pain. This is caused by: Weight gain. Pregnancy hormones that are relaxing the joints in your pelvis. Follow these instructions at home: Medicines Talk to your health care provider if you're taking medicines. Ask if the medicines are safe to take during pregnancy. Your provider may change the medicines that you take. Do not take any medicines unless told to by your provider. Take a prenatal vitamin that has at least 600 micrograms (mcg) of folic acid. Do not use herbal medicines, illegal drugs, or medicines that are not approved by your provider. Eating and drinking While you're pregnant your body needs  extra food for your growing baby. Talk with your provider about what to eat while pregnant. Activity Most women are able to exercise during pregnancy. Exercises may need to change as your pregnancy goes on. Talk to your provider about your activities and exercise routines. Relieving pain and discomfort Wear a good, supportive bra if your breasts hurt. Rest with your legs raised if you have leg cramps or low back pain. Take warm sitz baths to soothe pain from hemorrhoids. Use hemorrhoid cream if your provider says it's okay. Do not douche. Do not use tampons or scented pads. Do not use hot tubs, steam rooms, or saunas. Safety Wear your seatbelt at all times when you're in a car. Talk to your provider if someone hits you, hurts you, or yells at you. Talk with your provider if you're feeling sad or have thoughts of hurting yourself. Lifestyle Certain things can be harmful while you're pregnant. It's best to avoid the following: Do not drink alcohol,smoke, vape, or use products with nicotine or tobacco in them. If you need help quitting, talk with your provider. Avoid cat litter boxes and soil used by cats. These things carry germs that can cause harm to your pregnancy and your baby. General instructions Keep all follow-up visits. It helps you and your unborn baby stay as healthy as possible. Write down your questions. Take them to your prenatal visits. Your provider will: Talk with you about your overall health. Give you advice or refer you to specialists who can help with different needs,  including: Prenatal education classes. Mental health and counseling. Foods and healthy eating. Ask for help if you need help with food. Where to find more information American Pregnancy Association: americanpregnancy.org [employer]: acog.org Office on [employer] Health: [employer] Contact a health care provider if: You have a headache that does not go away  when you take medicine. You have any of these problems: You can't eat or drink. You throw up or feel like you may throw up. You have watery poop (diarrhea) for 2 days or more. You have pain when you pee or your pee smells bad. You have been sick for 2 days or more and are not getting better. Contact your provider right away if: You have any of these coming from your vagina: Abnormal discharge. Bad-smelling fluid. Bleeding. Your baby is moving less than usual. You have contractions, belly cramping, or have pain in your pelvis or lower back. You have symptoms of high blood pressure or preeclampsia. These include: A severe, throbbing headache that does not go away. Sudden or extreme swelling of your face, hands, legs, or feet. Vision problems: You see spots. You have blurry vision. Your eyes are sensitive to light. If you can't reach the provider, go to an urgent care or emergency room. Get help right away if: You faint, become confused, or can't think clearly. You have chest pain or trouble breathing. You have any kind of injury, such as from a fall or a car crash. These symptoms may be an emergency. Call 911 right away. Do not wait to see if the symptoms will go away. Do not drive yourself to the hospital. This information is not intended to replace advice given to you by your health care provider. Make sure you discuss any questions you have with your health care provider. Document Revised: 03/22/2023 Document Reviewed: 10/20/2022 Elsevier Patient Education  2024 [employer].

## 2024-05-28 ENCOUNTER — Ambulatory Visit (INDEPENDENT_AMBULATORY_CARE_PROVIDER_SITE_OTHER): Admitting: Certified Nurse Midwife

## 2024-05-28 VITALS — BP 109/54 | HR 82 | Wt 220.2 lb

## 2024-05-28 DIAGNOSIS — Z3480 Encounter for supervision of other normal pregnancy, unspecified trimester: Secondary | ICD-10-CM

## 2024-05-28 DIAGNOSIS — Z3A20 20 weeks gestation of pregnancy: Secondary | ICD-10-CM

## 2024-05-28 DIAGNOSIS — O99210 Obesity complicating pregnancy, unspecified trimester: Secondary | ICD-10-CM | POA: Insufficient documentation

## 2024-05-28 DIAGNOSIS — Z3482 Encounter for supervision of other normal pregnancy, second trimester: Secondary | ICD-10-CM | POA: Diagnosis not present

## 2024-05-28 DIAGNOSIS — O35BXX0 Maternal care for other (suspected) fetal abnormality and damage, fetal cardiac anomalies, not applicable or unspecified: Secondary | ICD-10-CM | POA: Insufficient documentation

## 2024-05-28 NOTE — Assessment & Plan Note (Signed)
 Red flag symptoms reviewed. Reviewed ultrasound images, report not yet complete. Discussed with normal NIPT & only EIF seen on ultrasound with normal anatomy noted there is not concern for cardiac problems. Given BMI>40 pre-pregnancy will need q4w growth scans and we discussed MFM referral for more detailed exam, she is in agreement with that and order placed.

## 2024-06-03 ENCOUNTER — Ambulatory Visit: Admitting: Licensed Clinical Social Worker

## 2024-06-03 DIAGNOSIS — F411 Generalized anxiety disorder: Secondary | ICD-10-CM

## 2024-06-03 NOTE — Progress Notes (Signed)
 Counselor/Therapist Progress Note  Patient ID: Felicia Jones, MRN: [id number],    Date: 06/03/2024  Time Spent: 48 total minutes. I spent 38 minutes on real-time audio with the patient on the date of service. I spent an additional 10  minutes on pre- and post-visit activities on the date of service including collateral, chart review, team discussion, and documentation.   Treatment Type: Individual Therapy  Reported Symptoms: Anxiety, anxious thoughts   Mental Status Exam:  Appearance:   NA     Behavior:  Appropriate, Sharing, and Motivated  Motor:  NA  Speech/Language:   Clear and Coherent and Normal Rate  Affect:  NA  Mood:  normal  Thought process:  normal  Thought content:    WNL  Sensory/Perceptual disturbances:    WNL  Orientation:  oriented to person, place, time/date, situation, and day of week  Attention:  Good  Concentration:  Good  Memory:  WNL  Fund of knowledge:   Good  Insight:    Fair  Judgment:   Fair  Impulse Control:  Fair   Risk Assessment: Danger to Self:  No Self-injurious Behavior: No Danger to Others: No Duty to Warn:no Physical Aggression / Violence:No  Access to Firearms a concern: No  Gang Involvement:No   Subjective: Patient reports she has been "okay." She describes ongoing frustration and stress related to recent interactions with extended family and her husband, expressing that she feels they do not respect or understand her decisions.  Interventions: Cognitive Behavioral Therapy LCSW established psychological safety at the start of the session. LCSW met with the patient to identify needs related to current stressors and overall functioning, and assessed for symptoms of anxiety and any safety concerns. Checked in with the patient regarding her functioning since the last session. Using CBT techniques, LCSW assisted the patient in processing thoughts and emotions related to interactions with extended family, in-laws, and her husband. Explored the  patient's perceptions of these challenges, helping her identify and articulate underlying thoughts, feelings, and cognitive patterns. Discussed communication dynamics, differences in values, and the pros and cons of various responses. Validated the patient's feelings and encouraged reflection on strategies that align with her values and support improved coping. Taught the patient the "container" technique to help her temporarily set aside overwhelming thoughts and gain cognitive and emotional distance when needed. Validated the patient's feelings and encouraged reflection on strategies that align with her values and support improved coping.  Diagnosis:   ICD-10-CM   1. Generalized anxiety disorder  F41.1      Plan: Patient shares that she wants to continue to work on her previous goal of treatment.    Patient's goal of treatment is how to not get so aggravated so fast, not feel so anxious all the time and have so many worries, learn to not stress so much.    Goal 1: Improve emotional regulation and reduce frequency and intensity of emotional reactivity (e.g., aggravation). Measurable Outcome:  Patient will report a 50% reduction in the frequency and intensity of feeling aggravated within 12 months, as measured by self-report and clinical observation.  Interventions:  Use CBT techniques to identify and reframe automatic thoughts contributing to emotional escalation.  Apply ACT strategies to increase psychological flexibility and decrease reactivity to difficult emotions.  Mindfulness practices (e.g., mindful pauses, breath awareness) to build tolerance for discomfort and reduce impulsive responses.  Implement somatic resourcing techniques (e.g., grounding, body scans) to support nervous system regulation during moments of distress.  Goal 2: Reduce overall  anxiety and excessive worry. Measurable Outcome:  Patient will report a decrease in anxiety symptoms from "daily and overwhelming"  to "occasional and manageable" within 12 months, as measured by clinical assessments and self-report.  Interventions:  Use CBT interventions to challenge and restructure maladaptive worry patterns and catastrophic thinking.  Practice defusion techniques from ACT (e.g., labeling thoughts, "I'm having the thought that.") to reduce attachment to anxious thoughts.  Guide patient in mindfulness-based stress reduction (MBSR) to observe anxious thoughts without judgment.  Use somatic tracking to increase awareness of bodily sensations related to anxiety and introduce techniques to shift out of hyperarousal (e.g., orienting, pendulation).  Goal 3: Increase ability to manage stress effectively and enhance overall coping strategies. Measurable Outcome:  Patient will identify and consistently use at least 3 effective coping strategies for managing stress, demonstrating use in 80% of reported high-stress situations over the next 12 months.   Interventions: Facilitate identification of personal stress triggers and co-develop a CBT-based coping plan.  Integrate ACT values clarification to help patient act in alignment with what matters most, even under stress.  Teach and reinforce daily mindfulness routines to promote present-moment awareness and reduce rumination.  Use somatic resourcing and regulation exercises (e.g., resourcing, tapping, movement) to support embodied stress relief and resilience.   Future Appointments  Date Time Provider Department Center  06/10/2024  9:00 AM Ellender Palma, LCSW [facility] None  06/24/2024  9:55 AM Slaughterbeck, Damien, CNM [facility] None  07/10/2024  9:30 AM Leigh Venetia CROME, MD [facility] None   Palma Ellender, LCSW

## 2024-06-10 ENCOUNTER — Telehealth: Payer: Self-pay | Admitting: Licensed Clinical Social Worker

## 2024-06-10 ENCOUNTER — Ambulatory Visit: Admitting: Licensed Clinical Social Worker

## 2024-06-10 NOTE — Telephone Encounter (Signed)
 Patient did not keep virtual appointment. LCSW attempted to call patient and lvm and waited on Zoom for 11 minutes.

## 2024-06-17 ENCOUNTER — Ambulatory Visit: Admitting: Licensed Clinical Social Worker

## 2024-06-17 DIAGNOSIS — F411 Generalized anxiety disorder: Secondary | ICD-10-CM | POA: Diagnosis not present

## 2024-06-17 NOTE — Progress Notes (Signed)
 Counselor/Therapist Progress Note  Patient ID: Felicia Jones, MRN: [id number],    Date: 06/17/2024  Time Spent: 55 total minutes. I spent 45 minutes on real-time audio and video with the patient on the date of service. I spent an additional 10 minutes on pre- and post-visit activities on the date of service including collateral, chart review, team discussion, and documentation.    Treatment Type: Individual Therapy  Reported Symptoms: Mild anxiety, intermittent irritability    Mental Status Exam:  Appearance:   Casual     Behavior:  Appropriate, Sharing, and Motivated  Motor:  Normal  Speech/Language:   Clear and Coherent and Normal Rate  Affect:  Appropriate, Congruent, and Full Range  Mood:  normal  Thought process:  normal  Thought content:    WNL  Sensory/Perceptual disturbances:    WNL  Orientation:  oriented to person, place, time/date, situation, and day of week  Attention:  Good  Concentration:  Good  Memory:  WNL  Fund of knowledge:   Good  Insight:    Fair  Judgment:   Fair  Impulse Control:  Fair   Risk Assessment: Danger to Self:  No Self-injurious Behavior: No Danger to Others: No Duty to Warn:no Physical Aggression / Violence:No  Access to Firearms a concern: No  Gang Involvement:No   Subjective: Patient reports mood as little up and down and a bit aggravated. She is learning to talk herself through it and has been trying to rest and take care of herself. Patient was engaged and cooperative throughout the session using time to discuss thoughts and feelings. She was receptive to feedback and intervention.   Interventions: Cognitive Behavioral Therapy and Mindfulness, Somatic and Polyvagal informed strategies, Client Centered  LCSW established psychological safety. Met with patient to assess current needs related to stressors and daily functioning. Monitored for symptoms of anxiety and depressive symptoms and assessed for safety. Conducted a follow-up  check-in on the patient's well-being since the last session. LCSW assisted the patient in processing thoughts and emotions related to ongoing challenges within her family of origin. The LCSW and patient explored the origins of these relational difficulties and examined the patients emotional responses to them. Discussion included childhood experiences that contributed to current patterns, identification of unhelpful or maladaptive thoughts, and exploration of trauma-related survival responses (fight, flight, freeze). Psychoeducation was provided regarding predictive processing, and the patient was guided in increasing awareness of and noticing thoughts and emotional responses in the present moment.  Diagnosis:   ICD-10-CM   1. Generalized anxiety disorder  F41.1      Plan: Patient shares that she wants to continue to work on her previous goal of treatment.    Patient's goal of treatment is how to not get so aggravated so fast, not feel so anxious all the time and have so many worries, learn to not stress so much.    Goal 1: Improve emotional regulation and reduce frequency and intensity of emotional reactivity (e.g., aggravation). Measurable Outcome:  Patient will report a 50% reduction in the frequency and intensity of feeling aggravated within 12 months, as measured by self-report and clinical observation.  Interventions:  Use CBT techniques to identify and reframe automatic thoughts contributing to emotional escalation.  Apply ACT strategies to increase psychological flexibility and decrease reactivity to difficult emotions.  Mindfulness practices (e.g., mindful pauses, breath awareness) to build tolerance for discomfort and reduce impulsive responses.  Implement somatic resourcing techniques (e.g., grounding, body scans) to support nervous system regulation during moments of  distress.  Goal 2: Reduce overall anxiety and excessive worry. Measurable Outcome:  Patient will report a  decrease in anxiety symptoms from daily and overwhelming to occasional and manageable within 12 months, as measured by clinical assessments and self-report.  Interventions:  Use CBT interventions to challenge and restructure maladaptive worry patterns and catastrophic thinking.  Practice defusion techniques from ACT (e.g., labeling thoughts, Im having the thought that) to reduce attachment to anxious thoughts.  Guide patient in mindfulness-based stress reduction (MBSR) to observe anxious thoughts without judgment.  Use somatic tracking to increase awareness of bodily sensations related to anxiety and introduce techniques to shift out of hyperarousal (e.g., orienting, pendulation).  Goal 3: Increase ability to manage stress effectively and enhance overall coping strategies. Measurable Outcome:  Patient will identify and consistently use at least 3 effective coping strategies for managing stress, demonstrating use in 80% of reported high-stress situations over the next 12 months.   Interventions: Facilitate identification of personal stress triggers and co-develop a CBT-based coping plan.  Integrate ACT values clarification to help patient act in alignment with what matters most, even under stress.  Teach and reinforce daily mindfulness routines to promote present-moment awareness and reduce rumination.  Use somatic resourcing and regulation exercises (e.g., resourcing, tapping, movement) to support embodied stress relief and resilience.   Future Appointments  Date Time Provider Department Center  06/19/2024  1:00 PM [facility] PROVIDER 1 [facility]  06/19/2024  1:30 PM [facility] US5 [facility]  06/23/2024 10:00 AM Ellender Palma, LCSW [facility] None  06/24/2024  9:55 AM Slaughterbeck, Damien, CNM [facility] None  07/10/2024  9:30 AM Leigh Venetia CROME, MD [facility] None    Palma Ellender, LCSW

## 2024-06-19 ENCOUNTER — Ambulatory Visit: Admitting: Obstetrics and Gynecology

## 2024-06-19 ENCOUNTER — Other Ambulatory Visit

## 2024-06-19 ENCOUNTER — Other Ambulatory Visit: Payer: Self-pay | Admitting: *Deleted

## 2024-06-19 VITALS — BP 116/50 | HR 78

## 2024-06-19 DIAGNOSIS — O99342 Other mental disorders complicating pregnancy, second trimester: Secondary | ICD-10-CM | POA: Insufficient documentation

## 2024-06-19 DIAGNOSIS — O99213 Obesity complicating pregnancy, third trimester: Secondary | ICD-10-CM

## 2024-06-19 DIAGNOSIS — O99210 Obesity complicating pregnancy, unspecified trimester: Secondary | ICD-10-CM

## 2024-06-19 DIAGNOSIS — Z3A23 23 weeks gestation of pregnancy: Secondary | ICD-10-CM

## 2024-06-19 DIAGNOSIS — O35BXX0 Maternal care for other (suspected) fetal abnormality and damage, fetal cardiac anomalies, not applicable or unspecified: Secondary | ICD-10-CM

## 2024-06-19 DIAGNOSIS — O09892 Supervision of other high risk pregnancies, second trimester: Secondary | ICD-10-CM | POA: Diagnosis not present

## 2024-06-19 DIAGNOSIS — O99212 Obesity complicating pregnancy, second trimester: Secondary | ICD-10-CM

## 2024-06-19 DIAGNOSIS — F419 Anxiety disorder, unspecified: Secondary | ICD-10-CM

## 2024-06-19 DIAGNOSIS — O358XX0 Maternal care for other (suspected) fetal abnormality and damage, not applicable or unspecified: Secondary | ICD-10-CM | POA: Insufficient documentation

## 2024-06-19 DIAGNOSIS — Z363 Encounter for antenatal screening for malformations: Secondary | ICD-10-CM | POA: Insufficient documentation

## 2024-06-19 DIAGNOSIS — O09899 Supervision of other high risk pregnancies, unspecified trimester: Secondary | ICD-10-CM

## 2024-06-19 DIAGNOSIS — E66813 Obesity, class 3: Secondary | ICD-10-CM

## 2024-06-19 DIAGNOSIS — E669 Obesity, unspecified: Secondary | ICD-10-CM

## 2024-06-19 DIAGNOSIS — O283 Abnormal ultrasonic finding on antenatal screening of mother: Secondary | ICD-10-CM

## 2024-06-19 NOTE — Progress Notes (Signed)
 Maternal-Fetal Medicine Consultation  Name: Felicia Jones  MRN: [id number]  GA: [id number] [redacted]w[redacted]d   Patient is here for fetal anatomy scan.  At your office ultrasound, an echogenic intracardiac focus was seen. On cell-free fetal DNA screening, the risks of aneuploidies are not increased. MSAFP screening showed low risk for open-neural tube defects.  Obstetrical history significant for 3 term vaginal deliveries.  Ultrasound Fetal biometry is consistent with her previously-established dates. Normal amniotic fluid.  An echogenic intracardiac focus is seen. No other makers of aneuploidies or fetal structural defects are seen.  Patient understands the limitations of ultrasound in detecting fetal anomalies.   Echogenic intracardiac focus (EIF) EIF is present in about 3% to 4% of normal fetuses and in some fetuses with Down syndrome.  Given that she has low risk for fetal Down syndrome on cell-free fetal DNA screening, this should be considered a normal variant.  I did not recommend amniocentesis for this finding.  I counseled the patient that cell free fetal DNA screening has a greater detection rate for Down syndrome. I reassured the patient that EIF is not associated with structural heart malformations.  However, only amniocentesis will give a definitive result on the fetal karyotype.  I explained amniocentesis procedure and possible complication of miscarriage (1 and 500 procedures). Patient opted not to have amniocentesis.  Pregravid BMI  Grade 3 obesity is independently associated with increased risk of stillbirth (2.5- to 3-fold), but the absolute risk is very small.  I discussed protocol of weekly antenatal testing from [redacted] weeks gestation until delivery. Obesity is associated with increased risk for gestational diabetes and gestational hypertension. Ultrasound has limitations in the resolution of ultrasound images and fetal anomalies may be missed.   I explained the components of antenatal testing  (biophysical profile) and that if reassuring, the risk of stillbirth is less than 1 per 1,000 births in one week.  Short Interpregnancy Interval It is defined as the time interval (18 to 24 months) between the end of previous pregnancy and the beginning of next pregnancy. The impact of short pregnancy interval on the outcome of subsequent pregnancy is uncertain. Some studies have shown that congenital anomalies, preterm delivery, and fetal growth restriction rates are increased in pregnancies with short interpregnancy interval.  However, it is not supported by other reports. Overall, we should expect good pregnancy outcomes.  Recommendations - Follow-up fetal growth assessment in 6 and 11 weeks. - Weekly antenatal testing from [redacted] weeks gestation until delivery.     Consultation including face-to-face (more than 50%) counseling 45 minutes.

## 2024-06-23 ENCOUNTER — Telehealth: Payer: Self-pay | Admitting: Licensed Clinical Social Worker

## 2024-06-23 ENCOUNTER — Ambulatory Visit: Admitting: Licensed Clinical Social Worker

## 2024-06-23 DIAGNOSIS — F411 Generalized anxiety disorder: Secondary | ICD-10-CM | POA: Diagnosis not present

## 2024-06-23 NOTE — Telephone Encounter (Signed)
 LCSW attempted to call patient due to no show for virtual appointment. LCSW left vm for patient regarding appointment and encouraged her to join or to call to reschedule.

## 2024-06-23 NOTE — Progress Notes (Signed)
 Counselor/Therapist Progress Note  Patient ID: Felicia Jones, MRN: [id number],    Date: 06/23/2024  Time Spent: 45 total minutes. I spent 35 minutes on real-time audio and video with the patient on the date of service. I spent an additional 10 minutes on pre- and post-visit activities on the date of service including collateral, chart review, team discussion, and documentation.   Arrived 15 minutes.   Treatment Type: Individual Therapy  Reported Symptoms: Overall stable mood, mild anxiety, mild irritability   Mental Status Exam:  Appearance:   NA     Behavior:  Appropriate, Sharing, and Motivated  Motor:  NA  Speech/Language:   Clear and Coherent and Normal Rate  Affect:  NA  Mood:  normal  Thought process:  normal  Thought content:    WNL  Sensory/Perceptual disturbances:    WNL  Orientation:  oriented to person, place, time/date, situation, and day of week  Attention:  Good  Concentration:  Good  Memory:  WNL  Fund of knowledge:   Good  Insight:    Fair  Judgment:   Fair  Impulse Control:  Fair   Risk Assessment: Danger to Self:  No Self-injurious Behavior: No Danger to Others: No Duty to Warn:no Physical Aggression / Violence:No  Access to Firearms a concern: No  Gang Involvement:No   Subjective: Patient reports her mood has been okay. She reports she is constantly telling herself not to be bothered by things and to be more positive, she reports it has been helping her to feel less aggravated.    Interventions: Cognitive Behavioral Therapy and Client Centered  LCSW established psychological safety. Met with patient to assess current needs related to stressors and daily functioning. Monitored for symptoms of anxiety and assessed for safety. Conducted a follow-up check-in on the patient's well-being since the last session.  LCSW utilized CBT and ACT interventions to support the patient in processing thoughts and emotions related to ongoing marital challenges. LCSW  provided emotional validation and normalized the patients feelings of frustration. Through CBT techniques, LCSW assisted the patient in identifying rigid thought patterns and exploring more flexible, balanced perspectives. Using ACT principles, LCSW guided the patient in clarifying personal values and discussing ways to engage in self-care while remaining aligned with those values. Patient was engaged and demonstrated insight during the session.  Diagnosis:   ICD-10-CM   1. Generalized anxiety disorder  F41.1      Plan: Patient shares that she wants to continue to work on her previous goal of treatment.    Patient's goal of treatment is how to not get so aggravated so fast, not feel so anxious all the time and have so many worries, learn to not stress so much.    Goal 1: Improve emotional regulation and reduce frequency and intensity of emotional reactivity (e.g., aggravation). Measurable Outcome:  Patient will report a 50% reduction in the frequency and intensity of feeling aggravated within 12 months, as measured by self-report and clinical observation.  Interventions:  Use CBT techniques to identify and reframe automatic thoughts contributing to emotional escalation.  Apply ACT strategies to increase psychological flexibility and decrease reactivity to difficult emotions.  Mindfulness practices (e.g., mindful pauses, breath awareness) to build tolerance for discomfort and reduce impulsive responses.  Implement somatic resourcing techniques (e.g., grounding, body scans) to support nervous system regulation during moments of distress.  Goal 2: Reduce overall anxiety and excessive worry. Measurable Outcome:  Patient will report a decrease in anxiety symptoms from daily and overwhelming to  occasional and manageable within 12 months, as measured by clinical assessments and self-report.  Interventions:  Use CBT interventions to challenge and restructure maladaptive worry  patterns and catastrophic thinking.  Practice defusion techniques from ACT (e.g., labeling thoughts, Im having the thought that) to reduce attachment to anxious thoughts.  Guide patient in mindfulness-based stress reduction (MBSR) to observe anxious thoughts without judgment.  Use somatic tracking to increase awareness of bodily sensations related to anxiety and introduce techniques to shift out of hyperarousal (e.g., orienting, pendulation).  Goal 3: Increase ability to manage stress effectively and enhance overall coping strategies. Measurable Outcome:  Patient will identify and consistently use at least 3 effective coping strategies for managing stress, demonstrating use in 80% of reported high-stress situations over the next 12 months.   Interventions: Facilitate identification of personal stress triggers and co-develop a CBT-based coping plan.  Integrate ACT values clarification to help patient act in alignment with what matters most, even under stress.  Teach and reinforce daily mindfulness routines to promote present-moment awareness and reduce rumination.  Use somatic resourcing and regulation exercises (e.g., resourcing, tapping, movement) to support embodied stress relief and resilience.  Future Appointments  Date Time Provider Department Center  06/24/2024  9:55 AM Slaughterbeck, Damien, CNM [facility] None  07/08/2024  9:00 AM Ellender Palma, LCSW [facility] None  07/10/2024  9:30 AM Leigh Venetia CROME, MD [facility] None  07/31/2024  9:15 AM [facility] PROVIDER 1 [facility]  07/31/2024  9:30 AM [facility] US5 [facility]  09/04/2024  9:15 AM [facility] PROVIDER 1 [facility]  09/04/2024  9:30 AM [facility] US1 [facility]    Palma Ellender, LCSW

## 2024-06-24 ENCOUNTER — Ambulatory Visit: Admitting: Certified Nurse Midwife

## 2024-06-24 ENCOUNTER — Encounter: Payer: Self-pay | Admitting: Certified Nurse Midwife

## 2024-06-24 VITALS — BP 105/57 | HR 88 | Wt 222.5 lb

## 2024-06-24 DIAGNOSIS — E669 Obesity, unspecified: Secondary | ICD-10-CM | POA: Diagnosis not present

## 2024-06-24 DIAGNOSIS — O99212 Obesity complicating pregnancy, second trimester: Secondary | ICD-10-CM

## 2024-06-24 DIAGNOSIS — Z3A23 23 weeks gestation of pregnancy: Secondary | ICD-10-CM

## 2024-06-24 DIAGNOSIS — O99210 Obesity complicating pregnancy, unspecified trimester: Secondary | ICD-10-CM

## 2024-06-24 DIAGNOSIS — Z3480 Encounter for supervision of other normal pregnancy, unspecified trimester: Secondary | ICD-10-CM

## 2024-06-24 DIAGNOSIS — Z3482 Encounter for supervision of other normal pregnancy, second trimester: Secondary | ICD-10-CM

## 2024-06-24 DIAGNOSIS — Z131 Encounter for screening for diabetes mellitus: Secondary | ICD-10-CM

## 2024-06-24 DIAGNOSIS — Z113 Encounter for screening for infections with a predominantly sexual mode of transmission: Secondary | ICD-10-CM

## 2024-06-24 DIAGNOSIS — Z114 Encounter for screening for human immunodeficiency virus [HIV]: Secondary | ICD-10-CM

## 2024-06-24 DIAGNOSIS — Z130 Encounter for screening for diseases of the blood and blood-forming organs and certain disorders involving the immune mechanism: Secondary | ICD-10-CM

## 2024-06-24 NOTE — Progress Notes (Signed)
" ° ° °  Return Prenatal Note   Subjective   28 y.o. [id number] at [redacted]w[redacted]d presents for this follow-up prenatal visit.  Patient is doing well. She reports good fetal movement. She has no new concerns and denies urinary symptoms. Patient reports: Movement: Present Contractions: Not present  Objective   Flow sheet Vitals: Pulse Rate: 88 BP: (!) 105/57 Fundal Height: 24 cm Fetal Heart Rate (bpm): 138 Total weight gain: 12 lb 8 oz (5.67 kg)  General Appearance  No acute distress, well appearing, and well nourished Pulmonary   Normal work of breathing Neurologic   Alert and oriented to person, place, and time Psychiatric   Mood and affect within normal limits   Assessment/Plan   Plan  28 y.o. H4E6986 at [redacted]w[redacted]d presents for follow-up OB visit. Reviewed prenatal record including previous visit note.  Supervision of other normal pregnancy, antepartum Feeling weak this morning. Felt better after food and hydration. Reviewed hydration and increased protein. Reviewed 28 week testing.  Reviewed red flag warning signs anticipatory guidance for upcoming prenatal care.    Obesity affecting pregnancy, antepartum Had anatomy US  with MFM. Follow up growth ordered and scheduled with them Antenatal testing to begin at 34 weeks.       Orders Placed This Encounter  Procedures   28 Week RH+Panel    Standing Status:   Future    Expected Date:   07/22/2024    Expiration Date:   10/20/2024   No follow-ups on file.   Future Appointments  Date Time Provider Department Center  07/08/2024  9:00 AM Ellender Palma, LCSW [facility] None  07/10/2024  9:30 AM Leigh Venetia CROME, MD [facility] None  07/24/2024  8:35 AM Leigh Sober, MD [facility] None  07/31/2024  9:15 AM [facility] PROVIDER 1 [facility]  07/31/2024  9:30 AM [facility] US5 [facility]  09/04/2024  9:15 AM [facility] PROVIDER 1 [facility]  09/04/2024  9:30 AM [facility] US1 [facility]    For next visit:  ROB with 1 hour glucola, third trimester labs, and Tdap      Damien Parsley, CNM [facility]  "

## 2024-06-24 NOTE — Assessment & Plan Note (Signed)
 Feeling weak this morning. Felt better after food and hydration. Reviewed hydration and increased protein. Reviewed 28 week testing.  Reviewed red flag warning signs anticipatory guidance for upcoming prenatal care.

## 2024-06-24 NOTE — Assessment & Plan Note (Signed)
 Had anatomy US  with MFM. Follow up growth ordered and scheduled with them Antenatal testing to begin at 34 weeks.

## 2024-06-30 NOTE — Progress Notes (Signed)
 "  Initial neurology clinic note  Felicia Jones MRN: [id number] DOB: [birth year]  Referring provider: Granda, Kaitlyn Alger, MD  Primary care provider: Granda, Kaitlyn Alger, MD  Reason for consult:  headaches  Subjective:  This is [name], a 28 y.o. right-handed female with a medical history of bilateral carpal tunnel syndrome, OA who presents to neurology clinic with headaches. The patient is alone today.  Patient has had headaches since high school. She was put on a medication back then (daily) but does not remember what it was (?maybe amitriptyline). She thinks that may have helped, but she remembers it making her sleepy. She stopped taking it at 28 years old when she got pregnant. Her headaches get worse when she is stressed or pregnant (which she is currently with her 4th child).   She describes 2 types of headaches. Her more severe headache she calls a migraine. She has pain in her forehead, around her eyes, and in her neck. It is severe requiring her to put a cold rag on her head and go lay down in the dark to sleep. It does not respond well to medication. She endorses photophobia, phonophobia, and nausea (no vomiting). She rates the headache as 10/10. The headache can last hours to a full day. She was having 2-3 of those headaches per week previously, but currently about 1 severe headache per week.  Her other headache is more mild and more in the temple areas and neck. She has some phonophobia, but no photophobia or nausea. She rates that about 5/10. This headache typically lasts a couple of hours and will respond to tylenol  or excedrin. She has this headache about once per week as well. She takes an OTC medication at least twice a week, sometimes more due to pain during pregnancy.   She also mentions that she is supposed to wear glasses, but does not have any currently.  Patient is currently [redacted]w[redacted]d in her pregnancy.  Smoker: no OCP/birth control use: no, currently  pregnant Caffiene use: occasional tea or coffee (twice a week) EtOH use: none Restrictive diet: no Family history of neurologic disease including headaches: no  Of note, patient has been told she has CTS on both sides due to hand pain and numbness. She has tried wrist braces without relief. She was to see someone about surgery, but never did. She has never had an EMG.  MEDICATIONS:  Outpatient Encounter Medications as of 07/10/2024  Medication Sig   acetaminophen  (TYLENOL ) 500 MG tablet Take 500 mg by mouth every 8 (eight) hours as needed.   prenatal vitamin w/FE, FA (NATACHEW) 29-1 MG CHEW chewable tablet Chew 1 tablet by mouth daily at 12 noon.   No facility-administered encounter medications on file as of 07/10/2024.    PAST MEDICAL HISTORY: Past Medical History:  Diagnosis Date   Arthritis    in back   Migraines     PAST SURGICAL HISTORY: Past Surgical History:  Procedure Laterality Date   WISDOM TOOTH EXTRACTION  2016   [age];    ALLERGIES: Allergies[1]  FAMILY HISTORY: Family History  Problem Relation Age of Onset   Migraines Mother    Healthy Father    Healthy Half-Sister    Healthy Half-Sister    Healthy Half-Sister    Healthy Half-Sister    Anxiety disorder Brother    Healthy Brother    Healthy Brother    Diabetes Maternal Grandmother    Hypertension Maternal Grandmother    Diabetes Maternal Grandfather  Hypertension Maternal Grandfather    Cancer Paternal Grandmother        unknown kind   Healthy Paternal Grandfather     SOCIAL HISTORY: Social History[2] Social History   Social History Narrative   Patient lives alone with her two children. She has some support from the children's father and has supportive family relationships.    Are you right handed or left handed? Right   Are you currently employed ? no   What is your current occupation?   Do you live at home alone?   Who lives with you? husband   What type of home do you live in: 1 story or  2 story? One   Caffeine  1 cup a day        Objective:  Vital Signs:  BP 111/72   Pulse 88   Ht 5' (1.524 m)   Wt 212 lb (96.2 kg)   LMP 01/09/2024   SpO2 100%   BMI 41.40 kg/m   General: No acute distress.  Patient appears well-groomed.   Head:  Normocephalic/atraumatic Eyes:  fundi examined but not visualized Neck: supple, no paraspinal tenderness, full range of motion Heart: regular rate and rhythm Lungs: Clear to auscultation bilaterally. Vascular: No carotid bruits.  Neurological Exam: Mental status: alert and oriented, speech fluent and not dysarthric, language intact.  Cranial nerves: CN I: not tested CN II: pupils equal, round and reactive to light, visual fields intact CN III, IV, VI:  full range of motion, no nystagmus, no ptosis CN V: facial sensation intact. CN VII: upper and lower face symmetric CN VIII: hearing intact CN IX, X: uvula midline CN XI: sternocleidomastoid and trapezius muscles intact CN XII: tongue midline  Bulk & Tone: normal Motor:  muscle strength 5/5 throughout Deep Tendon Reflexes:  2+ throughout.   Sensation:  Light touch sensation intact. Tinel's at wrist and Phalen test negative Finger to nose testing:  Without dysmetria.   Gait:  Normal station and stride.  Labs and Imaging review: Internal labs: 04/02/24: CMP unremarkable HbA1c: 5.6  02/10/23: CBC significant for Hb 9.8, MCV 74.8 RPR non-reactive  Assessment/Plan:  Felicia Jones is a 28 y.o. female who presents for evaluation of headaches and bilateral hand pain. She has a relevant medical history of bilateral carpal tunnel syndrome, OA, and is currently [redacted] weeks pregnant. Her neurological examination is essentially normal today. Patient's headaches sound consistent with episodic migraine without aura. These were present long before pregnancy and there are no current red flag symptoms. She was previously on a preventative medication, maybe amitriptyline. She is having 1-2  headaches per week, so a preventative medication would be indicated as well as a better rescue medication. However, with patient being pregnant currently, these medications are contraindicated during pregnancy, which patient understood and appreciated. I will give her OTC recommendations for now and plan to start other medications after pregnancy and/or breastfeeding.   In terms of her hand pain, she has been told she has CTS in the past but never had formal testing. This could be the etiology, but given that she has not responded to wrist splinting, the next step would be EMG, then if CTS, perhaps hand surgery referral for CTS release.  PLAN: -Blood work: B12, TSH -EMG of bilateral upper extremities 08/05/24 at 11 am -Recommended continuing wrist splints at night for now -May consider hand surgery referral if CTS is found, in the the future, as the near future would not be a good time for surgery or  limited use of hands. For migraines: Migraine prevention:  OTC recommendations given, particularly mentioned magnesium and melatonin. Other medications are not safe during pregnancy. Migraine rescue:  Continue Tylenol  as needed as Triptans are contraindicated during pregnancy Limit use of pain relievers to no more than 2 days out of week to prevent risk of rebound or medication-overuse headache. Keep headache diary  -Return to clinic in ~4 months  The impression above as well as the plan as outlined below were extensively discussed with the patient who voiced understanding. All questions were answered to their satisfaction.  When available, results of the above investigations and possible further recommendations will be communicated to the patient via telephone/MyChart. Patient to call office if not contacted after expected testing turnaround time.   Total time spent reviewing records, interview, history/exam, documentation, and coordination of care on day of encounter:  55 min   Thank you for  allowing me to participate in patient's care.  If I can answer any additional questions, I would be pleased to do so.  Venetia Potters, MD   CC: Jacquelynne Carolyn Gilford, MD [address] [place] [address]  CC: Referring provider: Granda, Kaitlyn Alger, MD [address] [place],  [place] [address]     [1] No Known Allergies [2]  Social History Tobacco Use   Smoking status: Never   Smokeless tobacco: Never  Vaping Use   Vaping status: Never Used  Substance Use Topics   Alcohol use: Not Currently    Alcohol/week: 1.0 standard drink of alcohol    Types: 1 Glasses of wine per week    Comment: very occasional   Drug use: No   "

## 2024-07-07 ENCOUNTER — Encounter: Payer: Self-pay | Admitting: Certified Nurse Midwife

## 2024-07-08 ENCOUNTER — Ambulatory Visit: Admitting: Licensed Clinical Social Worker

## 2024-07-08 DIAGNOSIS — F411 Generalized anxiety disorder: Secondary | ICD-10-CM

## 2024-07-08 NOTE — Progress Notes (Signed)
 Counselor/Therapist Progress Note  Patient ID: Felicia Jones, MRN: [id number],    Date: 07/08/2024  Time Spent: 57 total minutes. I spent 45 minutes on real-time audio with the patient on the date of service. I spent an additional 12 minutes on pre- and post-visit activities on the date of service including collateral, chart review, team discussion, and documentation.    Patient was 10 minutes late to appointment.   Treatment Type: Individual Therapy  Reported Symptoms: Intermittent anxiety, anxious thoughts, irritability   Mental Status Exam:  Appearance:   NA     Behavior:  Appropriate, Sharing, and Motivated  Motor:  NA  Speech/Language:   Clear and Coherent and Normal Rate  Affect:  NA  Mood:  normal  Thought process:  normal  Thought content:    WNL  Sensory/Perceptual disturbances:    WNL  Orientation:  oriented to person, place, time/date, situation, and day of week  Attention:  Good  Concentration:  Good  Memory:  WNL  Fund of knowledge:   Good  Insight:    Fair  Judgment:   Fair  Impulse Control:  Fair   Risk Assessment: Danger to Self:  No Self-injurious Behavior: No Danger to Others: No Duty to Warn:no Physical Aggression / Violence:No  Access to Firearms a concern: No  Gang Involvement:No   Subjective: Patient reports she has been aggrevated due to marital challenges and challenges with extended family / in-laws.   Interventions: Cognitive Behavioral Therapy and Client Centered, ACT  LCSW established psychological safety. Met with patient to assess current needs related to stressors and daily functioning. Monitored for symptoms of anxiety and assessed for safety. Conducted a follow-up check-in on the patient's well-being since the last session. LCSW utilized a blended CBT/ACT approach to assist the patient in processing thoughts and emotions related to challenges with extended family (in-laws) and marital stressors. LCSW validated the patients feelings of  frustration and emotional distress. Used guided discovery to explore the patients perceptions of relationship difficulties. LCSW supported the patient in identifying evidence that supports and contradicts automatic thoughts, encouraging cognitive flexibility and a more balanced perspective. Reviewed the patients values and explored how values can guide responses within interpersonal relationships. LCSW worked to increase psychological flexibility by helping the patient notice rigid thought patterns and consider values-consistent behaviors rather than avoidance or reactivity.  The patient was engaged in the process and demonstrated increased insight into the interaction between thoughts, emotions, and relational behaviors.  Diagnosis:   ICD-10-CM   1. Generalized anxiety disorder  F41.1      Plan: Patient shares that she wants to continue to work on her previous goal of treatment.    Patient's goal of treatment is how to not get so aggravated so fast, not feel so anxious all the time and have so many worries, learn to not stress so much.    Goal 1: Improve emotional regulation and reduce frequency and intensity of emotional reactivity (e.g., aggravation). Measurable Outcome:  Patient will report a 50% reduction in the frequency and intensity of feeling aggravated within 12 months, as measured by self-report and clinical observation.  Interventions:  Use CBT techniques to identify and reframe automatic thoughts contributing to emotional escalation.  Apply ACT strategies to increase psychological flexibility and decrease reactivity to difficult emotions.  Mindfulness practices (e.g., mindful pauses, breath awareness) to build tolerance for discomfort and reduce impulsive responses.  Implement somatic resourcing techniques (e.g., grounding, body scans) to support nervous system regulation during moments of distress.  Goal 2: Reduce overall anxiety and excessive worry. Measurable  Outcome:  Patient will report a decrease in anxiety symptoms from daily and overwhelming to occasional and manageable within 12 months, as measured by clinical assessments and self-report.  Interventions:  Use CBT interventions to challenge and restructure maladaptive worry patterns and catastrophic thinking.  Practice defusion techniques from ACT (e.g., labeling thoughts, Im having the thought that) to reduce attachment to anxious thoughts.  Guide patient in mindfulness-based stress reduction (MBSR) to observe anxious thoughts without judgment.  Use somatic tracking to increase awareness of bodily sensations related to anxiety and introduce techniques to shift out of hyperarousal (e.g., orienting, pendulation).  Goal 3: Increase ability to manage stress effectively and enhance overall coping strategies. Measurable Outcome:  Patient will identify and consistently use at least 3 effective coping strategies for managing stress, demonstrating use in 80% of reported high-stress situations over the next 12 months.   Interventions: Facilitate identification of personal stress triggers and co-develop a CBT-based coping plan.  Integrate ACT values clarification to help patient act in alignment with what matters most, even under stress.  Teach and reinforce daily mindfulness routines to promote present-moment awareness and reduce rumination.  Use somatic resourcing and regulation exercises (e.g., resourcing, tapping, movement) to support embodied stress relief and resilience.  Future Appointments  Date Time Provider Department Center  07/10/2024  9:30 AM Leigh Venetia CROME, MD [facility] None  07/15/2024  9:00 AM Ellender Palma, LCSW [facility] None  07/24/2024  8:35 AM Leigh Sober, MD [facility] None  07/31/2024  9:15 AM [facility] PROVIDER 1 [facility]  07/31/2024  9:30 AM [facility] US5 [facility]  09/04/2024  9:15 AM [facility] PROVIDER 1 [facility]  09/04/2024  9:30 AM [facility] US1 [facility]     Palma Ellender, LCSW

## 2024-07-10 ENCOUNTER — Other Ambulatory Visit

## 2024-07-10 ENCOUNTER — Encounter: Payer: Self-pay | Admitting: Neurology

## 2024-07-10 ENCOUNTER — Ambulatory Visit (INDEPENDENT_AMBULATORY_CARE_PROVIDER_SITE_OTHER): Admitting: Neurology

## 2024-07-10 VITALS — BP 111/72 | HR 88 | Ht 60.0 in | Wt 212.0 lb

## 2024-07-10 DIAGNOSIS — M79642 Pain in left hand: Secondary | ICD-10-CM | POA: Diagnosis not present

## 2024-07-10 DIAGNOSIS — M79641 Pain in right hand: Secondary | ICD-10-CM | POA: Diagnosis not present

## 2024-07-10 DIAGNOSIS — G43001 Migraine without aura, not intractable, with status migrainosus: Secondary | ICD-10-CM | POA: Diagnosis not present

## 2024-07-10 DIAGNOSIS — Z3A26 26 weeks gestation of pregnancy: Secondary | ICD-10-CM | POA: Diagnosis not present

## 2024-07-10 NOTE — Patient Instructions (Addendum)
 I saw you today for headaches and pain in your hands.   I think your headaches are migraines. Since you are currently pregnant, a lot of the medications that can be given are not safe. I will recommend you continue to take Tylenol  when you have a headache and try some of these supplements to take every day to help prevent headaches:  Vitamins and herbs that show potential to help with headaches:  Magnesium: Magnesium (250 mg twice a day or 500 mg at bed) has a relaxant effect on smooth muscles such as blood vessels. Individuals suffering from frequent or daily headache usually have low magnesium levels which can be increase with daily supplementation of 400-750 mg. Three trials found 40-90% average headache reduction when used as a preventative. Magnesium also demonstrated the benefit in menstrually related migraine. Magnesium is part of the messenger system in the serotonin cascade and it is a good muscle relaxant. It is also useful for constipation which can be a side effect of other medications used to treat migraine. Good sources include nuts, whole grains, and tomatoes.  Riboflavin (vitamin B 2) 200 mg twice a day. This vitamin assists nerve cells in the production of ATP a principal energy storing molecule. It is necessary for many chemical reactions in the body. There have been at least 3 clinical trials of riboflavin using 400 mg per day all of which suggested that migraine frequency can be decreased. All 3 trials showed significant improvement in over half of migraine sufferers. The supplement is found in bread, cereal, milk, meat, and poultry. Most Americans get more riboflavin than the recommended daily allowance, however riboflavin deficiency is not necessary for the supplements to help prevent headache.  Feverfew: Feverfew is a common garden herb native to [place] and popular in [place] as a treatment for disorders typically controlled by aspirin . The mechanism of action is unknown but  is believed to be related to a chemical called parthenolide which helps the body use serotonin more effectively. Serotonin helps prevent migraine and assists with resolution when it occurs. Parthenolide also inhibits the release of histamine which is linked to pain and inflammation.  Consistency of active ingredients in different products can be a problem. Some formulations don't have the active ingredient (parthenolide) that prevents migraine. A parthenolide content of 0.2% is generally recommended. Typical dosage is one capsule 3 times a day.  Coenzyme Q10: This is present in almost all cells in the body and is critical component for the conversion of energy. Recent studies have shown that a nutritional supplement of CoQ10 can reduce the frequency of migraine attacks by improving the energy production of cells as with riboflavin. Doses of 150 mg twice a day have been shown to be effective.  Melatonin: Increasing evidence shows correlation between melatonin secretion and headache conditions. Melatonin supplementation has decreased headache intensity and duration. It is widely used as a sleep aid. Sleep is natures way of dealing with migraine. A dose of 3 mg is recommended to start for headaches including cluster headache. Higher doses up to 15 mg has been reviewed for use in Cluster headache and have been used. The rationale behind using melatonin for cluster is that many theories regarding the cause of Cluster headache center around the disruption of the normal circadian rhythm in the brain. This helps restore the normal circadian rhythm.  Ginger: Ginger has a small amount of antihistamine and anti-inflammatory action which may help headache. It is primarily used for nausea and may aid in  the absorption of other medications.  More migraine information: Be aware of common food triggers:  - Caffeine :  coffee, black tea, cola, Mt. Dew  - Chocolate  - Dairy:  aged cheeses (brie, blue, cheddar, gouda,  Parm[place], provolone, romano, Swiss, etc), chocolate milk, buttermilk, sour cream, limit eggs and yogurt  - Nuts, peanut butter  - Alcohol  - Cereals/grains:  FRESH breads (fresh bagels, sourdough, doughnuts), yeast productions  - Processed/canned/aged/cured meats (pre-packaged deli meats, hotdogs)  - MSG/glutamate:  soy sauce, flavor enhancer, pickled/preserved/marinated foods  - Sweeteners:  aspartame (Equal, Nutrasweet).  Sugar and Splenda are okay  - Vegetables:  legumes (lima beans, lentils, snow peas, fava beans, pinto peans, peas, garbanzo beans), sauerkraut, onions, olives, pickles  - Fruit:  avocados, bananas, citrus fruit (orange, lemon, grapefruit), mango  - Other:  Frozen meals, macaroni and cheese Routine exercise Stay adequately hydrated (aim for 64 oz water daily) Keep headache diary Maintain proper stress management Maintain proper sleep hygiene Do not skip meals  I will get labs to look for other causes of headaches today too.  The pain in your hands may be carpal tunnel. I would want to prove this prior to sending you to a hand surgeon. We will do nerve testing called EMG (see more information below). In the meantime, wear this wrist brace on both wrists while sleeping:  Cock up wrist splint for carpal tunnel symptoms can be bought in local drug stores or online. This should especially be worn at night while sleeping.     I will be in touch when I have your results.  We will get back together to talk about other options after your pregnancy (~4 months).  Please let me know if you have any questions or concerns in the meantime.  The physicians and staff at [facility] are committed to providing excellent care. You may receive a survey requesting feedback about your experience at our office. We strive to receive very good responses to the survey questions. If you feel that your experience would prevent you from giving the office a very good  response, please  contact our office to try to remedy the situation. We may be reached at [phone]. Thank you for taking the time out of your busy day to complete the survey.  Venetia Potters, MD [facility]  ELECTROMYOGRAM AND NERVE CONDUCTION STUDIES (EMG/NCS) INSTRUCTIONS  How to Prepare The neurologist conducting the EMG will need to know if you have certain medical conditions. Tell the neurologist and other EMG lab personnel if you: Have a pacemaker or any other electrical medical device Take blood-thinning medications Have hemophilia, a blood-clotting disorder that causes prolonged bleeding Bathing Take a shower or bath shortly before your exam in order to remove oils from your skin. Dont apply lotions or creams before the exam.  What to Expect Youll likely be asked to change into a hospital gown for the procedure and lie down on an examination table. The following explanations can help you understand what will happen during the exam.  Electrodes. The neurologist or a technician places surface electrodes at various locations on your skin depending on where youre experiencing symptoms. Or the neurologist may insert needle electrodes at different sites depending on your symptoms.  Sensations. The electrodes will at times transmit a tiny electrical current that you may feel as a twinge or spasm. The needle electrode may cause discomfort or pain that usually ends shortly after the needle is removed. If you are concerned about discomfort  or pain, you may want to talk to the neurologist about taking a short break during the exam.  Instructions. During the needle EMG, the neurologist will assess whether there is any spontaneous electrical activity when the muscle is at rest - activity that isnt present in healthy muscle tissue - and the degree of activity when you slightly contract the muscle.  He or she will give you instructions on resting and contracting a muscle at appropriate times. Depending on what  muscles and nerves the neurologist is examining, he or she may ask you to change positions during the exam.  After your EMG You may experience some temporary, minor bruising where the needle electrode was inserted into your muscle. This bruising should fade within several days. If it persists, contact your primary care doctor.

## 2024-07-11 ENCOUNTER — Ambulatory Visit: Payer: Self-pay | Admitting: Neurology

## 2024-07-11 ENCOUNTER — Other Ambulatory Visit: Payer: Self-pay

## 2024-07-11 DIAGNOSIS — R202 Paresthesia of skin: Secondary | ICD-10-CM

## 2024-07-11 LAB — TSH: TSH: 1.92 m[IU]/L

## 2024-07-11 LAB — VITAMIN B12: Vitamin B-12: 229 pg/mL (ref 200–1100)

## 2024-07-15 ENCOUNTER — Ambulatory Visit: Admitting: Licensed Clinical Social Worker

## 2024-07-15 DIAGNOSIS — F411 Generalized anxiety disorder: Secondary | ICD-10-CM

## 2024-07-15 NOTE — Progress Notes (Signed)
 Counselor/Therapist Progress Note  Patient ID: Felicia Jones, MRN: [id number],    Date: 07/15/2024  Time Spent: 57 total minutes. I spent 47 minutes on real-time audio and video with the patient on the date of service. I spent an additional 10  minutes on pre- and post-visit activities on the date of service including collateral, chart review, team discussion, and documentation.   Treatment Type: Individual Therapy  Reported Symptoms: Anxiety, anxious thoughts and worries, irritability   Mental Status Exam:  Appearance:   Casual     Behavior:  Appropriate, Sharing, and Motivated  Motor:  Normal  Speech/Language:   Clear and Coherent and Normal Rate  Affect:  Appropriate, Congruent, and Full Range  Mood:  normal  Thought process:  normal  Thought content:    WNL  Sensory/Perceptual disturbances:    WNL  Orientation:  oriented to person, place, time/date, situation, and day of week  Attention:  Good  Concentration:  Good  Memory:  WNL  Fund of knowledge:   Good  Insight:    Fair  Judgment:   Fair  Impulse Control:  Fair   Risk Assessment: Danger to Self:  No Self-injurious Behavior: No Danger to Others: No Duty to Warn:no Physical Aggression / Violence:No  Access to Firearms a concern: No  Gang Involvement:No   Subjective: Patient reports her mood has been up and down.  She reports she has been getting aggravated. Patient was actively engaged processing thoughts and feelings throughout the session.   Interventions: Cognitive Behavioral Therapy, Mindfulness Meditation, and Client Centered  LCSW established psychological safety. LCSW met with patient to identify needs related to stressors and functioning, and assess and monitor for signs and symptoms of anxiety and assess safety. Checked in with patient regarding how they have been doing since the last follow-up session. LCSW assisted patient in processing their thoughts and emotions regarding stress related to car issues and  marital discord. LCSW provided psychoeducation on anxiety, and discussed emotions. LCSW intervened with positive regard and validated patient's emotions, and supported patient in exploring ways to increase her intentional use of self care.    Diagnosis:   ICD-10-CM   1. Generalized anxiety disorder  F41.1      Plan: Patient shares that she wants to continue to work on her previous goal of treatment.    Patient's goal of treatment is how to not get so aggravated so fast, not feel so anxious all the time and have so many worries, learn to not stress so much.    Goal 1: Improve emotional regulation and reduce frequency and intensity of emotional reactivity (e.g., aggravation). Measurable Outcome:  Patient will report a 50% reduction in the frequency and intensity of feeling aggravated within 12 months, as measured by self-report and clinical observation.  Interventions:  Use CBT techniques to identify and reframe automatic thoughts contributing to emotional escalation.  Apply ACT strategies to increase psychological flexibility and decrease reactivity to difficult emotions.  Mindfulness practices (e.g., mindful pauses, breath awareness) to build tolerance for discomfort and reduce impulsive responses.  Implement somatic resourcing techniques (e.g., grounding, body scans) to support nervous system regulation during moments of distress.  Goal 2: Reduce overall anxiety and excessive worry. Measurable Outcome:  Patient will report a decrease in anxiety symptoms from daily and overwhelming to occasional and manageable within 12 months, as measured by clinical assessments and self-report.  Interventions:  Use CBT interventions to challenge and restructure maladaptive worry patterns and catastrophic thinking.  Practice defusion techniques from  ACT (e.g., labeling thoughts, Im having the thought that) to reduce attachment to anxious thoughts.  Guide patient in mindfulness-based  stress reduction (MBSR) to observe anxious thoughts without judgment.  Use somatic tracking to increase awareness of bodily sensations related to anxiety and introduce techniques to shift out of hyperarousal (e.g., orienting, pendulation).  Goal 3: Increase ability to manage stress effectively and enhance overall coping strategies. Measurable Outcome:  Patient will identify and consistently use at least 3 effective coping strategies for managing stress, demonstrating use in 80% of reported high-stress situations over the next 12 months.   Interventions: Facilitate identification of personal stress triggers and co-develop a CBT-based coping plan.  Integrate ACT values clarification to help patient act in alignment with what matters most, even under stress.  Teach and reinforce daily mindfulness routines to promote present-moment awareness and reduce rumination.  Use somatic resourcing and regulation exercises (e.g., resourcing, tapping, movement) to support embodied stress relief and resilience.  Future Appointments  Date Time Provider Department Center  07/22/2024 10:00 AM Ellender Palma, LCSW AC-[facility] None  07/24/2024  8:35 AM Leigh Sober, MD [facility] None  07/31/2024  9:15 AM [facility] PROVIDER 1 [facility]  07/31/2024  9:30 AM [facility] US5 [facility]  08/05/2024 11:00 AM Leigh Venetia CROME, MD LBN-[facility] None  09/04/2024  9:15 AM [facility] PROVIDER 1 [facility]  09/04/2024  9:30 AM [facility] US1 [facility]  01/30/2025  9:00 AM Hill, Venetia CROME, MD LBN-[facility] None   Palma Ellender, LCSW

## 2024-07-21 ENCOUNTER — Other Ambulatory Visit: Payer: Self-pay

## 2024-07-21 NOTE — Progress Notes (Unsigned)
" ° ° °  Return Prenatal Note   Subjective  29 y.o. H4E6986 at [redacted]w[redacted]d presents for this follow-up prenatal visit.   Patient scheduled with me today to discuss BTL, but pt states she no longer wants. Her partner is considering vasectomy and is she is sure she will not pursue BTL postpartum. Has MFM growth US  coming on on 1/29 for BMI.   Patient reports: Movement: Present Contractions: Irregular Denies vaginal bleeding or leaking fluid. Objective  Flow sheet Vitals: Pulse Rate: (!) 112 BP: (!) 106/56 Fundal Height: 28 cm Fetal Heart Rate (bpm): 147 Total weight gain: 13 lb 14.4 oz (6.305 kg)  General Appearance  No acute distress, well appearing, and well nourished Pulmonary   Normal work of breathing Neurologic   Alert and oriented to person, place, and time Psychiatric   Mood and affect within normal limits   Assessment/Plan   Plan  29 y.o. H4E6986 at [redacted]w[redacted]d presents for follow-up OB visit. Reviewed prenatal record including previous visit note. 1. Supervision of high risk pregnancy, antepartum (Primary) -1hGTT today with 28wk labs -Tdap completed  -Is planning partner vasectomy for contraception, no longer wants BTL -Questions re: laboring at home, last labor 2-3hrs total; counseled to come into hospital once regular contractions begin  2. Obesity affecting pregnancy, antepartum, unspecified obesity type -PG-BMI 41, current 43.7, TWG 13# appropriate -Growth US  scheduled 1/29 with MFM  3. Fetal cardiac echogenic focus, antepartum, single or unspecified fetus -MFM to do f/u US  on 1/29  Return in about 2 weeks (around 08/07/2024) for ROB.   Future Appointments  Date Time Provider Department Center  07/29/2024  9:00 AM Ellender Palma, LCSW [facility] None  07/31/2024  9:15 AM [facility] PROVIDER 1 [facility]  07/31/2024  9:30 AM [facility] US5 [facility]  08/05/2024 11:00 AM Leigh Venetia CROME, MD LBN-LBNG None  08/06/2024  8:15 AM Slaughterbeck, Damien, CNM AOB-A[facility] None  09/04/2024  9:15 AM  [facility] PROVIDER 1 [facility]  09/04/2024  9:30 AM [facility] US1 [facility]  01/30/2025  9:00 AM Hill, Venetia CROME, MD LBN-[facility] None    For next visit:  continue with routine prenatal care    Estil Mangle, DO [facility] "

## 2024-07-22 ENCOUNTER — Ambulatory Visit: Admitting: Licensed Clinical Social Worker

## 2024-07-22 DIAGNOSIS — F411 Generalized anxiety disorder: Secondary | ICD-10-CM

## 2024-07-22 NOTE — Progress Notes (Signed)
 Counselor/Therapist Progress Note  Patient ID: Felicia Jones, MRN: [id number],    Date: 07/22/2024  Time Spent: 57 total minutes. I spent 47 minutes on real-time audio and video with the patient on the date of service. I spent an additional 10 minutes on pre- and post-visit activities on the date of service including collateral, chart review, team discussion, and documentation.   Treatment Type: Individual Therapy  Reported Symptoms: Anxiety, anxious thoughts, overwhelm, irritability   Mental Status Exam:  Appearance:   Casual     Behavior:  Appropriate, Sharing, and Motivated  Motor:  Normal  Speech/Language:   Clear and Coherent and Normal Rate  Affect:  Appropriate, Congruent, and Full Range  Mood:  normal  Thought process:  normal  Thought content:    WNL  Sensory/Perceptual disturbances:    WNL  Orientation:  oriented to person, place, time/date, situation, and day of week  Attention:  Good  Concentration:  Good  Memory:  WNL  Fund of knowledge:   Good  Insight:    Fair  Judgment:   Fair  Impulse Control:  Fair   Risk Assessment: Danger to Self:  No Self-injurious Behavior: No Danger to Others: No Duty to Warn:no Physical Aggression / Violence:No  Access to Firearms a concern: No  Gang Involvement:No   Subjective: Patient reports she doesn't know how she is, just surviving.  She was engaged and cooperative throughout the session discussing thoughts and feelings.   Interventions: Cognitive Behavioral Therapy and Client Centered  LCSW established psychological safety. LCSW met with patient to identify needs related to stressors and functioning, and assess and monitor for signs and symptoms of anxiety and depressed mood, and assess safety. Checked in with patient regarding how they have been doing since the last follow-up session. LCSW assisted patient in processing their thoughts and emotions regarding multiple stressors, including martial challenges, challenges with  family of origin and not having a car. LCSW utilized, CBT to address the patient's maladaptive thought patterns and interpersonal challenges. LCSW provided validation of the patient's emotional distress to reinforce the therapeutic alliance, specifically assisting the patient in identifying and discussing emotions of sadness and anger related to recent life events. Through Socratic questioning, the patient was guided to process difficult thoughts fueling these emotions and successfully engaged in cognitive reframing to develop a more balanced perspective. Additionally, the session involved a discussion on increasing communication, where the clinician and patient explored the alignment between the patient's personal values and current interpersonal boundaries.   Diagnosis:   ICD-10-CM   1. Generalized anxiety disorder  F41.1      Plan: Patient shares that she wants to continue to work on her previous goal of treatment.    Patient's goal of treatment is how to not get so aggravated so fast, not feel so anxious all the time and have so many worries, learn to not stress so much.    Goal 1: Improve emotional regulation and reduce frequency and intensity of emotional reactivity (e.g., aggravation). Measurable Outcome:  Patient will report a 50% reduction in the frequency and intensity of feeling aggravated within 12 months, as measured by self-report and clinical observation.  Interventions:  Use CBT techniques to identify and reframe automatic thoughts contributing to emotional escalation.  Apply ACT strategies to increase psychological flexibility and decrease reactivity to difficult emotions.  Mindfulness practices (e.g., mindful pauses, breath awareness) to build tolerance for discomfort and reduce impulsive responses.  Implement somatic resourcing techniques (e.g., grounding, body scans) to support nervous  system regulation during moments of distress.  Goal 2: Reduce overall anxiety and  excessive worry. Measurable Outcome:  Patient will report a decrease in anxiety symptoms from daily and overwhelming to occasional and manageable within 12 months, as measured by clinical assessments and self-report.  Interventions:  Use CBT interventions to challenge and restructure maladaptive worry patterns and catastrophic thinking.  Practice defusion techniques from ACT (e.g., labeling thoughts, Im having the thought that) to reduce attachment to anxious thoughts.  Guide patient in mindfulness-based stress reduction (MBSR) to observe anxious thoughts without judgment.  Use somatic tracking to increase awareness of bodily sensations related to anxiety and introduce techniques to shift out of hyperarousal (e.g., orienting, pendulation).  Goal 3: Increase ability to manage stress effectively and enhance overall coping strategies. Measurable Outcome:  Patient will identify and consistently use at least 3 effective coping strategies for managing stress, demonstrating use in 80% of reported high-stress situations over the next 12 months.   Interventions: Facilitate identification of personal stress triggers and co-develop a CBT-based coping plan.  Integrate ACT values clarification to help patient act in alignment with what matters most, even under stress.  Teach and reinforce daily mindfulness routines to promote present-moment awareness and reduce rumination.  Use somatic resourcing and regulation exercises (e.g., resourcing, tapping, movement) to support embodied stress relief and resilience.   Future Appointments  Date Time Provider Department Center  07/24/2024  8:35 AM Leigh Sober, MD [facility] None  07/29/2024  9:00 AM Ellender Palma, LCSW [facility] None  07/31/2024  9:15 AM [facility] PROVIDER 1 [facility] [place]  07/31/2024  9:30 AM [facility] US5 [facility]  08/05/2024 11:00 AM Leigh Venetia CROME, MD L[facility] None  09/04/2024  9:15 AM [facility] PROVIDER 1 [facility]  09/04/2024  9:30 AM  [facility] US1 [facility]  01/30/2025  9:00 AM Hill, Venetia CROME, MD L[facility] None    Palma Ellender, LCSW

## 2024-07-24 ENCOUNTER — Ambulatory Visit: Admitting: Obstetrics

## 2024-07-24 ENCOUNTER — Encounter: Payer: Self-pay | Admitting: Obstetrics

## 2024-07-24 ENCOUNTER — Other Ambulatory Visit

## 2024-07-24 VITALS — BP 106/56 | HR 112 | Wt 223.9 lb

## 2024-07-24 DIAGNOSIS — E669 Obesity, unspecified: Secondary | ICD-10-CM

## 2024-07-24 DIAGNOSIS — O99213 Obesity complicating pregnancy, third trimester: Secondary | ICD-10-CM | POA: Diagnosis not present

## 2024-07-24 DIAGNOSIS — Z113 Encounter for screening for infections with a predominantly sexual mode of transmission: Secondary | ICD-10-CM

## 2024-07-24 DIAGNOSIS — Z3A28 28 weeks gestation of pregnancy: Secondary | ICD-10-CM | POA: Diagnosis not present

## 2024-07-24 DIAGNOSIS — Z131 Encounter for screening for diabetes mellitus: Secondary | ICD-10-CM

## 2024-07-24 DIAGNOSIS — Z114 Encounter for screening for human immunodeficiency virus [HIV]: Secondary | ICD-10-CM

## 2024-07-24 DIAGNOSIS — O35BXX0 Maternal care for other (suspected) fetal abnormality and damage, fetal cardiac anomalies, not applicable or unspecified: Secondary | ICD-10-CM

## 2024-07-24 DIAGNOSIS — Z23 Encounter for immunization: Secondary | ICD-10-CM

## 2024-07-24 DIAGNOSIS — O99210 Obesity complicating pregnancy, unspecified trimester: Secondary | ICD-10-CM

## 2024-07-24 DIAGNOSIS — Z130 Encounter for screening for diseases of the blood and blood-forming organs and certain disorders involving the immune mechanism: Secondary | ICD-10-CM

## 2024-07-24 DIAGNOSIS — O0990 Supervision of high risk pregnancy, unspecified, unspecified trimester: Secondary | ICD-10-CM

## 2024-07-24 DIAGNOSIS — Z3482 Encounter for supervision of other normal pregnancy, second trimester: Secondary | ICD-10-CM

## 2024-07-24 NOTE — Patient Instructions (Signed)
 Third Trimester of Pregnancy  The third trimester of pregnancy is from week 28 through week 40. This is months 7 through 9. The third trimester is a time when your baby is growing fast. Body changes during your third trimester Your body continues to change during this time. The changes usually go away after your baby is born. Physical changes You will continue to gain weight. You may get stretch marks on your hips, belly, and breasts. Your breasts will keep growing and may hurt. A yellow fluid (colostrum) may leak from your breasts. This is the first milk you're making for your baby. Your hair may grow faster and get thicker. In some cases, you may get hair loss. Your belly button may stick out. You may have more swelling in your hands, face, or ankles. Health changes You may have heartburn. You may feel short of breath. This is caused by the uterus that is now bigger. You may have more aches in the pelvis, back, or thighs. You may have more tingling or numbness in your hands, arms, and legs. You may pee more often. You may have trouble pooping (constipation) or swollen veins in the butt that can itch or get painful (hemorrhoids). Other changes You may have more problems sleeping. You may notice the baby moving lower in your belly (dropping). You may have more fluid coming from your vagina. Your joints may feel loose, and you may have pain around your pelvic bone. Follow these instructions at home: Medicines Take medicines only as told by your health care provider. Some medicines are not safe during pregnancy. Your provider may change the medicines that you take. Do not take any medicines unless told to by your provider. Take a prenatal vitamin that has at least 600 micrograms (mcg) of folic acid. Do not use herbal medicines, illegal drugs, or medicines that are not approved by your provider. Eating and drinking While you're pregnant your body needs additional nutrition to help  support your growing baby. Talk with your provider about your nutritional needs. Activity Most women are able to exercise regularly during pregnancy. Exercise routines may need to change at the end of your pregnancy. Talk to your provider about your activities and exercise routine. Relieving pain and discomfort Rest often with your legs raised if you have leg cramps or low back pain. Take warm sitz baths to soothe pain from hemorrhoids. Use hemorrhoid cream if your provider says it's okay. Wear a good, supportive bra if your breasts hurt. Do not use hot tubs, steam rooms, or saunas. Do not douche. Do not use tampons or scented pads. Safety Talk to your provider before traveling far distances. Wear your seatbelt at all times when you're in a car. Talk to your provider if someone hits you, hurts you, or yells at you. Preparing for birth To prepare for your baby: Take childbirth and breastfeeding classes. Visit the hospital and tour the maternity area. Buy a rear-facing car seat. Learn how to install it in your car. General instructions Avoid cat litter boxes and soil used by cats. These things carry germs that can cause harm to your pregnancy and your baby. Do not drink alcohol, smoke, vape, or use products with nicotine or tobacco in them. If you need help quitting, talk with your provider. Keep all follow-up visits for your third trimester. Your provider will do more exams and tests during this trimester. Write down your questions. Take them to your prenatal visits. Your provider also will: Talk with you about  your overall health. Give you advice or refer you to specialists who can help with different needs, including: Mental health and counseling. Foods and healthy eating. Ask for help if you need help with food. Where to find more information American Pregnancy Association: americanpregnancy.org [employer]: acog.org Office on [employer] Health:  [employer] Contact a health care provider if: You have a headache that does not go away when you take medicine. You have any of these problems: You can't eat or drink. You have nausea and vomiting. You have watery poop (diarrhea) for 2 days or more. You have pain when you pee, or your pee smells bad. You have been sick for 2 days or more and aren't getting better. Contact your provider right away if: You have any of these coming from your vagina: Abnormal discharge. Bad-smelling fluid. Bleeding. Your baby is moving less than usual. You have signs of labor: You have any contractions, belly cramping, or have pain in your pelvis or lower back before 37 weeks of pregnancy (preterm labor). You have regular contractions that are less than 5 minutes apart. Your water breaks. You have symptoms of high blood pressure or preeclampsia. These include: A severe, throbbing headache that does not go away. Sudden or extreme swelling of your face, hands, legs, or feet. Vision problems: You see spots. You have blurry vision. Your eyes are sensitive to light. If you can't reach your provider, go to an urgent care or emergency room. Get help right away if: You faint, become confused, or can't think clearly. You have chest pain or trouble breathing. You have any kind of injury, such as from a fall or a car crash. These symptoms may be an emergency. Call 911 right away. Do not wait to see if the symptoms will go away. Do not drive yourself to the hospital. This information is not intended to replace advice given to you by your health care provider. Make sure you discuss any questions you have with your health care provider. Document Revised: 03/22/2023 Document Reviewed: 10/20/2022 Elsevier Patient Education  2024 [employer].

## 2024-07-24 NOTE — Addendum Note (Signed)
 Addended by: VICCI ROLLO BRAVO on: 07/24/2024 10:42 AM   Modules accepted: Orders

## 2024-07-25 ENCOUNTER — Ambulatory Visit: Payer: Self-pay | Admitting: Certified Nurse Midwife

## 2024-07-25 ENCOUNTER — Encounter: Payer: Self-pay | Admitting: Certified Nurse Midwife

## 2024-07-25 DIAGNOSIS — D649 Anemia, unspecified: Secondary | ICD-10-CM | POA: Insufficient documentation

## 2024-07-25 LAB — 28 WEEK RH+PANEL
Basophils Absolute: 0 x10E3/uL (ref 0.0–0.2)
Basos: 0 %
EOS (ABSOLUTE): 0.1 x10E3/uL (ref 0.0–0.4)
Eos: 1 %
Gestational Diabetes Screen: 105 mg/dL (ref 70–139)
HIV Screen 4th Generation wRfx: NONREACTIVE
Hematocrit: 33.2 % — ABNORMAL LOW (ref 34.0–46.6)
Hemoglobin: 10.8 g/dL — ABNORMAL LOW (ref 11.1–15.9)
Immature Grans (Abs): 0.1 x10E3/uL (ref 0.0–0.1)
Immature Granulocytes: 1 %
Lymphocytes Absolute: 2.2 x10E3/uL (ref 0.7–3.1)
Lymphs: 24 %
MCH: 26.8 pg (ref 26.6–33.0)
MCHC: 32.5 g/dL (ref 31.5–35.7)
MCV: 82 fL (ref 79–97)
Monocytes Absolute: 0.6 x10E3/uL (ref 0.1–0.9)
Monocytes: 7 %
Neutrophils Absolute: 6.2 x10E3/uL (ref 1.4–7.0)
Neutrophils: 67 %
Platelets: 199 x10E3/uL (ref 150–450)
RBC: 4.03 x10E6/uL (ref 3.77–5.28)
RDW: 14 % (ref 11.7–15.4)
RPR Ser Ql: NONREACTIVE
WBC: 9.3 x10E3/uL (ref 3.4–10.8)

## 2024-07-29 ENCOUNTER — Ambulatory Visit: Admitting: Licensed Clinical Social Worker

## 2024-07-29 DIAGNOSIS — F411 Generalized anxiety disorder: Secondary | ICD-10-CM

## 2024-07-29 NOTE — Progress Notes (Signed)
 Counselor/Therapist Progress Note  Patient ID: Felicia Jones, MRN: [id number],    Date: 07/29/2024  Time Spent: 56 total minutes. I spent 46 minutes on real-time audio and video with the patient on the date of service. I spent an additional 10 minutes on pre- and post-visit activities on the date of service including collateral, chart review, team discussion, and documentation.     Treatment Type: Individual Therapy  Reported Symptoms: Mild anxiety, anxious thoughts, worries   Mental Status Exam:  Appearance:   Casual, Neat, and Well Groomed     Behavior:  Appropriate, Sharing, and Motivated  Motor:  Normal  Speech/Language:   Clear and Coherent and Normal Rate  Affect:  Appropriate, Congruent, and Full Range  Mood:  euthymic  Thought process:  normal  Thought content:    WNL  Sensory/Perceptual disturbances:    WNL  Orientation:  oriented to person, place, time/date, situation, and day of week  Attention:  Good  Concentration:  Good  Memory:  WNL  Fund of knowledge:   Good  Insight:    Fair  Judgment:   Fair  Impulse Control:  Fair   Risk Assessment: Danger to Self:  No Self-injurious Behavior: No Danger to Others: No Duty to Warn:no Physical Aggression / Violence:No  Access to Firearms a concern: No  Gang Involvement:No   Subjective: Patient reports she doesn't know how she is feeling or doing at this time. The session focused primarily on marital challenges. Patient remained engaged throughout the session and was receptive to clinical feedback.  Interventions: Cognitive Behavioral Therapy and Client Centered  LCSW established psychological safety. LCSW met with patient to identify needs related to stressors and functioning, and assess and monitor for signs and symptoms of anxiety, and assess safety. Checked in with patient regarding how they have been doing since the last follow-up session. LCSW assisted the patient in processing thoughts and emotions regarding marital  challenges using active listening, validation, and a strengths-based approach. Intervention focused on cognitive reframing and broadening the patient's perspective to promote psychological flexibility and values-based decision-making. LCSW provided psychoeducation on effective communication strategies to improve interpersonal dynamics with her spouse and encouraged the patient to increase self-care activities to mitigate current stressors.  Diagnosis:   ICD-10-CM   1. Generalized anxiety disorder  F41.1      Plan: Patient shares that she wants to continue to work on her previous goal of treatment.    Patient's goal of treatment is how to not get so aggravated so fast, not feel so anxious all the time and have so many worries, learn to not stress so much.    Goal 1: Improve emotional regulation and reduce frequency and intensity of emotional reactivity (e.g., aggravation). Measurable Outcome:  Patient will report a 50% reduction in the frequency and intensity of feeling aggravated within 12 months, as measured by self-report and clinical observation.  Interventions:  Use CBT techniques to identify and reframe automatic thoughts contributing to emotional escalation.  Apply ACT strategies to increase psychological flexibility and decrease reactivity to difficult emotions.  Mindfulness practices (e.g., mindful pauses, breath awareness) to build tolerance for discomfort and reduce impulsive responses.  Implement somatic resourcing techniques (e.g., grounding, body scans) to support nervous system regulation during moments of distress.  Goal 2: Reduce overall anxiety and excessive worry. Measurable Outcome:  Patient will report a decrease in anxiety symptoms from daily and overwhelming to occasional and manageable within 12 months, as measured by clinical assessments and self-report.  Interventions:  Use CBT interventions to challenge and restructure maladaptive worry patterns and  catastrophic thinking.  Practice defusion techniques from ACT (e.g., labeling thoughts, Im having the thought that) to reduce attachment to anxious thoughts.  Guide patient in mindfulness-based stress reduction (MBSR) to observe anxious thoughts without judgment.  Use somatic tracking to increase awareness of bodily sensations related to anxiety and introduce techniques to shift out of hyperarousal (e.g., orienting, pendulation).  Goal 3: Increase ability to manage stress effectively and enhance overall coping strategies. Measurable Outcome:  Patient will identify and consistently use at least 3 effective coping strategies for managing stress, demonstrating use in 80% of reported high-stress situations over the next 12 months.   Interventions: Facilitate identification of personal stress triggers and co-develop a CBT-based coping plan.  Integrate ACT values clarification to help patient act in alignment with what matters most, even under stress.  Teach and reinforce daily mindfulness routines to promote present-moment awareness and reduce rumination.  Use somatic resourcing and regulation exercises (e.g., resourcing, tapping, movement) to support embodied stress relief and resilience.  Future Appointments  Date Time Provider Department Center  07/31/2024  9:15 AM [facility] PROVIDER 1 [facility]  07/31/2024  9:30 AM [facility] US5 [facility]  08/04/2024 11:00 AM Ellender Palma, LCSW [facility] None  08/05/2024 11:00 AM Leigh Venetia CROME, MD [facility] None  08/06/2024  8:15 AM Slaughterbeck, Damien, CNM [facility] None  09/04/2024  9:15 AM [facility] PROVIDER 1 [facility]  09/04/2024  9:30 AM [facility] US1 [facility]  01/30/2025  9:00 AM Hill, Venetia CROME, MD [facility] None   Palma Ellender, LCSW

## 2024-07-31 ENCOUNTER — Ambulatory Visit: Attending: Obstetrics and Gynecology | Admitting: Obstetrics and Gynecology

## 2024-07-31 ENCOUNTER — Ambulatory Visit

## 2024-07-31 VITALS — BP 116/52 | HR 89

## 2024-07-31 DIAGNOSIS — O99213 Obesity complicating pregnancy, third trimester: Secondary | ICD-10-CM | POA: Insufficient documentation

## 2024-07-31 DIAGNOSIS — F419 Anxiety disorder, unspecified: Secondary | ICD-10-CM | POA: Diagnosis not present

## 2024-07-31 DIAGNOSIS — O35BXX0 Maternal care for other (suspected) fetal abnormality and damage, fetal cardiac anomalies, not applicable or unspecified: Secondary | ICD-10-CM | POA: Insufficient documentation

## 2024-07-31 DIAGNOSIS — Z3A29 29 weeks gestation of pregnancy: Secondary | ICD-10-CM

## 2024-07-31 DIAGNOSIS — O09893 Supervision of other high risk pregnancies, third trimester: Secondary | ICD-10-CM | POA: Insufficient documentation

## 2024-07-31 DIAGNOSIS — O99210 Obesity complicating pregnancy, unspecified trimester: Secondary | ICD-10-CM

## 2024-07-31 DIAGNOSIS — O99342 Other mental disorders complicating pregnancy, second trimester: Secondary | ICD-10-CM | POA: Diagnosis not present

## 2024-07-31 DIAGNOSIS — E669 Obesity, unspecified: Secondary | ICD-10-CM

## 2024-07-31 DIAGNOSIS — O09899 Supervision of other high risk pregnancies, unspecified trimester: Secondary | ICD-10-CM

## 2024-07-31 DIAGNOSIS — O99212 Obesity complicating pregnancy, second trimester: Secondary | ICD-10-CM

## 2024-07-31 DIAGNOSIS — Z364 Encounter for antenatal screening for fetal growth retardation: Secondary | ICD-10-CM | POA: Insufficient documentation

## 2024-07-31 NOTE — Progress Notes (Signed)
 After review, MFM consult with provider is not indicated for today  Arna Ranks, MD 07/31/2024 1:22 PM  [facility]

## 2024-08-04 ENCOUNTER — Ambulatory Visit: Admitting: Licensed Clinical Social Worker

## 2024-08-04 ENCOUNTER — Telehealth: Payer: Self-pay | Admitting: Licensed Clinical Social Worker

## 2024-08-04 NOTE — Telephone Encounter (Signed)
 Patient did not show to virtual appointment. LCSW attempted to call patient and lvm.

## 2024-08-05 ENCOUNTER — Encounter: Payer: Self-pay | Admitting: Neurology

## 2024-08-06 ENCOUNTER — Ambulatory Visit: Admitting: Certified Nurse Midwife

## 2024-08-06 VITALS — BP 111/60 | HR 96 | Wt 225.0 lb

## 2024-08-06 DIAGNOSIS — O99210 Obesity complicating pregnancy, unspecified trimester: Secondary | ICD-10-CM

## 2024-08-06 DIAGNOSIS — O0990 Supervision of high risk pregnancy, unspecified, unspecified trimester: Secondary | ICD-10-CM

## 2024-08-06 MED ORDER — FERROUS SULFATE 325 (65 FE) MG PO TBEC: 325.0000 mg | DELAYED_RELEASE_TABLET | Freq: Three times a day (TID) | ORAL | 0 refills | Status: AC

## 2024-08-06 NOTE — Assessment & Plan Note (Signed)
 Reviewed pain near her belly is likely ligament pain. No evidence of hernia. MFM told her that her baby was low. Discussed this was not a risk factor but rather because this is her 4th pregnancy and reviewed pelvic floor involvement.  Reviewed kick counts and preterm labor warning signs. Instructed to call office or come to hospital with persistent headache, vision changes, regular contractions, leaking of fluid, decreased fetal movement or vaginal bleeding.

## 2024-08-06 NOTE — Assessment & Plan Note (Signed)
 Growth with MFM on 1/29- 29w- EFW 57%, AC 69%, AFI - 17.74

## 2024-08-20 ENCOUNTER — Encounter: Admitting: Certified Nurse Midwife

## 2024-08-26 ENCOUNTER — Encounter: Admitting: Neurology

## 2024-09-03 ENCOUNTER — Encounter: Admitting: Certified Nurse Midwife

## 2024-09-04 ENCOUNTER — Ambulatory Visit

## 2024-09-29 ENCOUNTER — Encounter: Admitting: Neurology

## 2024-10-15 ENCOUNTER — Inpatient Hospital Stay: Admit: 2024-10-15

## 2025-01-23 ENCOUNTER — Ambulatory Visit: Payer: Self-pay | Admitting: Neurology

## 2025-01-30 ENCOUNTER — Ambulatory Visit: Admitting: Neurology
